# Patient Record
Sex: Female | Born: 1980 | Race: Black or African American | Hispanic: No | Marital: Single | Smoking: Former smoker
Health system: Southern US, Community
[De-identification: ages and names within clinical notes are randomized; demographics above are authoritative.]

## PROBLEM LIST (undated history)

## (undated) ENCOUNTER — Ambulatory Visit (HOSPITAL_COMMUNITY): Admission: EM | Payer: BLUE CROSS/BLUE SHIELD | Source: Home / Self Care

## (undated) DIAGNOSIS — R399 Unspecified symptoms and signs involving the genitourinary system: Secondary | ICD-10-CM

## (undated) DIAGNOSIS — R319 Hematuria, unspecified: Secondary | ICD-10-CM

## (undated) DIAGNOSIS — N809 Endometriosis, unspecified: Secondary | ICD-10-CM

## (undated) DIAGNOSIS — N3289 Other specified disorders of bladder: Secondary | ICD-10-CM

## (undated) DIAGNOSIS — R51 Headache: Secondary | ICD-10-CM

## (undated) DIAGNOSIS — N200 Calculus of kidney: Secondary | ICD-10-CM

## (undated) DIAGNOSIS — Z8719 Personal history of other diseases of the digestive system: Secondary | ICD-10-CM

## (undated) DIAGNOSIS — R300 Dysuria: Secondary | ICD-10-CM

## (undated) DIAGNOSIS — N80A0 Endometriosis of bladder, unspecified depth: Secondary | ICD-10-CM

## (undated) DIAGNOSIS — K56609 Unspecified intestinal obstruction, unspecified as to partial versus complete obstruction: Secondary | ICD-10-CM

## (undated) DIAGNOSIS — N808 Other endometriosis: Secondary | ICD-10-CM

## (undated) HISTORY — DX: Headache: R51

## (undated) HISTORY — PX: LAPAROSCOPY: SHX197

## (undated) HISTORY — PX: DILATION AND CURETTAGE OF UTERUS: SHX78

## (undated) HISTORY — PX: PARTIAL HYSTERECTOMY: SHX80

---

## 2006-04-06 ENCOUNTER — Emergency Department (HOSPITAL_COMMUNITY): Admission: EM | Admit: 2006-04-06 | Discharge: 2006-04-06 | Payer: Self-pay | Admitting: Emergency Medicine

## 2006-06-16 ENCOUNTER — Emergency Department (HOSPITAL_COMMUNITY): Admission: EM | Admit: 2006-06-16 | Discharge: 2006-06-16 | Payer: Self-pay | Admitting: Emergency Medicine

## 2007-07-29 ENCOUNTER — Inpatient Hospital Stay (HOSPITAL_COMMUNITY): Admission: RE | Admit: 2007-07-29 | Discharge: 2007-08-01 | Payer: Self-pay | Admitting: Obstetrics

## 2007-08-21 ENCOUNTER — Emergency Department (HOSPITAL_COMMUNITY): Admission: EM | Admit: 2007-08-21 | Discharge: 2007-08-21 | Payer: Self-pay | Admitting: Emergency Medicine

## 2008-10-03 ENCOUNTER — Emergency Department (HOSPITAL_COMMUNITY): Admission: EM | Admit: 2008-10-03 | Discharge: 2008-10-04 | Payer: Self-pay | Admitting: Emergency Medicine

## 2010-02-10 ENCOUNTER — Emergency Department (HOSPITAL_COMMUNITY)
Admission: EM | Admit: 2010-02-10 | Discharge: 2010-02-10 | Payer: Self-pay | Source: Home / Self Care | Admitting: Family Medicine

## 2010-05-30 NOTE — Discharge Summary (Signed)
NAMECHANAE, GEMMA NO.:  [id number]   MEDICAL RECORD NO.:  [id number]          PATIENT TYPE:  INP   LOCATION:  9146                          FACILITY:  [facility]   PHYSICIAN:  Kathreen Cosier, M.D.DATE OF BIRTH:  1980/06/14   DATE OF ADMISSION:  07/29/2007  DATE OF DISCHARGE:  08/01/2007                               DISCHARGE SUMMARY   The patient is a 30 year old gravida 2, para 1-0-0-1, and EDC on August 03, 2007.  She had a previous C-section and now at term desired repeat C-  section.  Negative GBS-HIV.  She had a female, Apgar 9 and 9, weighing 7  pounds 4 ounces, and posterior placenta to labor and delivery.  Postoperative hemoglobin was 8.7.  ARP are negative.  HIV negative.  She  did well and was discharged on the third postoperative day; ambulatory  on a regular diet, on Tylox for pain, and ferrous sulfate for her  anemia.  Discharged to see me in 6 weeks.   DISCHARGE DIAGNOSIS:  Status post repeat low-transverse cesarean section  at term.           ______________________________  Kathreen Cosier, M.D.     BAM/MEDQ  D:  08/20/2007  T:  08/20/2007  Job:  [id number]

## 2010-05-30 NOTE — Op Note (Signed)
Christine Brooks, URY NO.:  [id number]   MEDICAL RECORD NO.:  [id number]          PATIENT TYPE:  INP   LOCATION:  9146                          FACILITY:  [facility]   PHYSICIAN:  Kathreen Cosier, M.D.DATE OF BIRTH:  1980-01-23   DATE OF PROCEDURE:  07/29/2007  DATE OF DISCHARGE:                               OPERATIVE REPORT   PREOPERATIVE DIAGNOSIS:  Previous cesarean section at term, desires  repeat.   POSTOPERATIVE DIAGNOSIS:  Previous cesarean section at term, desires  repeat.   SURGEON:  Kathreen Cosier, MD   ASSISTANT:  Bing Neighbors. Clearance Coots, MD   PROCEDURE:  The patient was placed on the operating table in supine  position.  After the spinal administered, abdomen prepped and draped.  Bladder emptied with a Foley catheter.  Transverse suprapubic incision  made through the old scar carried down to the rectus fascia.  Fascia  cleaned and incised to the length of the incision.  Recti muscles were  retracted laterally.  The peritoneum was incised longitudinally.  Transverse incision made in the visceral peritoneum above the bladder.  Bladder mobilized inferiorly.  Transverse low uterine incision made.  Fluid clear.  The patient delivered of a female.  Agpar 9 and 9, weighing  7 pound 4 ounces from the LOA position.  The team was in attendance.  The fluid was clear.  The placenta was posterior and was removed  manually and sent to labor and delivery.  Uterine cavity cleaned with  dry laps.  Uterine incision closed in 1 layer with continuous suture of  #1 chromic.  Hemostasis was satisfactory.  Bladder flap reattached with  2-0 chromic.  Uterus well contracted.  Tubes and ovaries normal.  Abdomen closed in layers.  Peritoneum continuous suture of 0-chromic  fascia, continuous suture of 0-Dexon and the skin closed with  subcuticular stitch of 4-0 Monocryl.  Blood loss 800 mL.  The patient  tolerated the procedure well and taken to recovery room in good  condition.           ______________________________  Kathreen Cosier, M.D.     BAM/MEDQ  D:  07/29/2007  T:  07/30/2007  Job:  [id number]

## 2010-10-12 LAB — CBC
MCHC: 33.8
MCV: 84.5
Platelets: 147 — ABNORMAL LOW

## 2010-10-12 LAB — RPR: RPR Ser Ql: NONREACTIVE

## 2010-10-13 LAB — CBC
HCT: 28.6 — ABNORMAL LOW
Platelets: 128 — ABNORMAL LOW
RDW: 14.7

## 2010-10-13 LAB — POCT RAPID STREP A: Streptococcus, Group A Screen (Direct): NEGATIVE

## 2011-02-19 ENCOUNTER — Other Ambulatory Visit: Payer: Self-pay | Admitting: Obstetrics and Gynecology

## 2011-02-19 ENCOUNTER — Telehealth (HOSPITAL_COMMUNITY): Payer: Self-pay | Admitting: *Deleted

## 2011-02-19 ENCOUNTER — Emergency Department (INDEPENDENT_AMBULATORY_CARE_PROVIDER_SITE_OTHER)
Admission: EM | Admit: 2011-02-19 | Discharge: 2011-02-19 | Disposition: A | Discharge status: Home / Self Care | Payer: Self-pay | Source: Home / Self Care | Attending: Emergency Medicine | Admitting: Emergency Medicine

## 2011-02-19 ENCOUNTER — Encounter (HOSPITAL_COMMUNITY): Payer: Self-pay | Admitting: Emergency Medicine

## 2011-02-19 DIAGNOSIS — N6039 Fibrosclerosis of unspecified breast: Secondary | ICD-10-CM

## 2011-02-19 DIAGNOSIS — N6320 Unspecified lump in the left breast, unspecified quadrant: Secondary | ICD-10-CM

## 2011-02-19 MED ORDER — NAPROXEN 500 MG PO TABS: 500.0000 mg | ORAL_TABLET | Freq: Two times a day (BID) | ORAL | Status: AC

## 2011-02-19 NOTE — ED Notes (Signed)
PT HERE WITH LEFT BREAST ACHY PAIN THAT STARTED INTERMIT LAST MNTH BUT HAS PROGRESSED WITH PT HAVING TO HOLD BREAST DUE TO PAIN.UNABLE TO PALPATE KNOT.NO C/O NIPPLE DRAINAGE.PT LMP LAST YEAR POST DEPO INJECTION.HAS NO INSURANCE

## 2011-02-19 NOTE — ED Notes (Addendum)
2/4 1138 Order for digital mammography received from Dr. Ladon Applebaum and faxed to [phone]. Confirmation received. 1500 I called the [facility] for appt. and she said they need the order for an [place] also. It is done whenever there is a palpable mass. I told her Dr. Ladon Applebaum has already left but I would get the order and refax. She gave me appt. time of 2/11 @ 1330. Order obtained from Dr. Artis Flock for [place]. Order refaxed  and confirmation received. Pt. notified. Christine Brooks 02/19/2011

## 2011-02-19 NOTE — ED Provider Notes (Addendum)
History     CSN: [id number]  Arrival date & time 02/19/11  0930   First MD Initiated Contact with Patient 02/19/11 1057      Chief Complaint  Patient presents with  . Breast Pain    (Consider location/radiation/quality/duration/timing/severity/associated sxs/prior treatment) HPI Comments: Breast pain, and palpable not" It hurst all over my Left breast (points to outer quadrant) No skin changes, No fevers, No recent trauma injuries or falls.  The history is provided by the patient.    History reviewed. No pertinent past medical history.  History reviewed. No pertinent past surgical history.  History reviewed. No pertinent family history.  History  Substance Use Topics  . Smoking status: Current Everyday Smoker  . Smokeless tobacco: Not on file  . Alcohol Use: No    OB History    Grav Para Term Preterm Abortions TAB SAB Ect Mult Living                  Review of Systems  Constitutional: Negative for fever, appetite change, fatigue and unexpected weight change.  HENT: Negative for neck pain.   Respiratory: Negative for shortness of breath.   Cardiovascular: Negative for chest pain.  Skin: Negative for color change and rash.    Allergies  Review of patient's allergies indicates no known allergies.  Home Medications   Current Outpatient Rx  Name Route Sig Dispense Refill  . NAPROXEN 500 MG PO TABS Oral Take 1 tablet (500 mg total) by mouth 2 (two) times daily. 15 tablet 0    BP 119/76  Pulse 75  Temp(Src) 98 F (36.7 C) (Oral)  Resp 16  SpO2 100%  Physical Exam  Constitutional: She appears well-developed and well-nourished. No distress.  HENT:  Head: Normocephalic.  Eyes: Conjunctivae are normal. No scleral icterus.  Neck: Neck supple. No JVD present.  Pulmonary/Chest: Left breast exhibits mass and tenderness. Left breast exhibits no inverted nipple, no nipple discharge and no skin change. Breasts are symmetrical.    Lymphadenopathy:    She has no  cervical adenopathy.    ED Course  Procedures (including critical care time)  Labs Reviewed - No data to display No results found.   1. Breast fibrosclerosis       MDM  Left sided breast pain with abnormal tissue consistency, patient relates to have had breast cyst before, but this feels bigger and tender. Outer (superior and lower quadrants with fibrotic type tissue? Generated referral to [facility] and further evaluation, form was given to S.Y, PATIENT EXPLAINED IMPORTANCE to be evaluated by Breast center to exclude a malignant process, acknowledged and understood, agreed with plan of care.        Jimmie Molly, MD 02/19/11 1514  Jimmie Molly, MD 02/19/11 1517  Jimmie Molly, MD 02/19/11 [phone]

## 2011-02-26 ENCOUNTER — Ambulatory Visit
Admission: RE | Admit: 2011-02-26 | Discharge: 2011-02-26 | Disposition: A | Discharge status: Home / Self Care | Payer: Self-pay | Source: Ambulatory Visit | Attending: Obstetrics and Gynecology | Admitting: Obstetrics and Gynecology

## 2011-02-26 DIAGNOSIS — N6320 Unspecified lump in the left breast, unspecified quadrant: Secondary | ICD-10-CM

## 2013-02-23 ENCOUNTER — Encounter (HOSPITAL_COMMUNITY): Payer: Self-pay | Admitting: Emergency Medicine

## 2013-02-23 ENCOUNTER — Emergency Department (INDEPENDENT_AMBULATORY_CARE_PROVIDER_SITE_OTHER)
Admission: EM | Admit: 2013-02-23 | Discharge: 2013-02-23 | Disposition: A | Discharge status: Home / Self Care | Payer: BC Managed Care – PPO | Source: Home / Self Care | Attending: Family Medicine | Admitting: Family Medicine

## 2013-02-23 DIAGNOSIS — R519 Headache, unspecified: Secondary | ICD-10-CM

## 2013-02-23 DIAGNOSIS — R51 Headache: Secondary | ICD-10-CM

## 2013-02-23 MED ORDER — DICLOFENAC SODIUM 50 MG PO TBEC: 50.0000 mg | DELAYED_RELEASE_TABLET | Freq: Two times a day (BID) | ORAL | Status: DC | PRN

## 2013-02-23 NOTE — ED Provider Notes (Signed)
Weber Cookszia Castelo is a 33 y.o. female who presents to Urgent Care today for posterior left-sided headache. This is been present off and on for several months but worsening over the past 6 days or so. The pain is moderate. She denies any weakness or numbness loss of coordination or dysfunction. She's not tried any medications. No fevers or chills nausea vomiting or diarrhea.   History reviewed. No pertinent past medical history. History  Substance Use Topics  . Smoking status: Current Every Day Smoker  . Smokeless tobacco: Not on file  . Alcohol Use: No   ROS as above Medications: No current facility-administered medications for this encounter.   Current Outpatient Prescriptions  Medication Sig Dispense Refill  . diclofenac (VOLTAREN) 50 MG EC tablet Take 1 tablet (50 mg total) by mouth 2 (two) times daily as needed.  60 tablet  0    Exam:  BP 114/78  Pulse 70  Temp(Src) 98.7 F (37.1 C) (Oral)  Resp 14  SpO2 100%  LMP 02/18/2013 Gen: Well NAD HEENT: EOMI,  MMM, no scalp abnormality. Lungs: Normal work of breathing. CTABL Heart: RRR no MRG Abd: NABS, Soft. NT, ND Exts: Brisk capillary refill, warm and well perfused.  Neuro: AO x3. Sensation, strength and motion are intact.  Normal gait  No results found for this or any previous visit (from the past 24 hour(s)). No results found.  Assessment and Plan: 33 y.o. female with headache. Likely muscular pain however patient is very concerned about it. Plan to treat with diclofenac and refer to neurology for further workup and evaluation.  Discussed warning signs or symptoms. Please see discharge instructions. Patient expresses understanding.    Rodolph BongEvan S Zan Triska, MD 02/23/13 1350

## 2013-02-23 NOTE — ED Notes (Signed)
C/o having pains off/on all over head but mainly at the back of the head.  Pt has not tried any otc meds for symptoms.  Denies any other symptoms.  On set 2 days ago.

## 2013-02-23 NOTE — Discharge Instructions (Signed)
Thank you for coming in today. Take diclofenac twice daily as needed for pain. Followup with [facility] if still symptomatic in a week or 2. Try to followup with her primary care Dr. Nyra CapesGo to the emergency room if your headache becomes excruciating or you have weakness or numbness or uncontrolled vomiting.

## 2013-02-25 ENCOUNTER — Encounter: Payer: Self-pay | Admitting: Neurology

## 2013-02-25 ENCOUNTER — Ambulatory Visit (INDEPENDENT_AMBULATORY_CARE_PROVIDER_SITE_OTHER): Payer: BC Managed Care – PPO | Admitting: Neurology

## 2013-02-25 VITALS — BP 104/60 | HR 68 | Temp 98.0°F | Resp 18 | Ht 61.0 in | Wt 116.6 lb

## 2013-02-25 DIAGNOSIS — R51 Headache: Secondary | ICD-10-CM

## 2013-02-25 DIAGNOSIS — R519 Headache, unspecified: Secondary | ICD-10-CM | POA: Insufficient documentation

## 2013-02-25 MED ORDER — GABAPENTIN 100 MG PO CAPS: ORAL_CAPSULE | ORAL | Status: DC

## 2013-02-25 NOTE — Progress Notes (Signed)
NEUROLOGY CONSULTATION NOTE Terasa Orsini MRN: [id number] DOB: Oct 16, 1980  Referring provider: Dr. Clementeen Graham Primary care provider: None  Reason for consult:  Left posterior head pain  Dear Dr Denyse Amass:  Thank you for your kind referral of Murray Guzzetta for consultation of the above symptoms. Although her history is well known to you, please allow me to reiterate it for the purpose of our medical record.   HISTORY OF PRESENT ILLNESS: This is a 33 year old right-handed woman with no significant past medical history, who was in her usual state of health until a week ago when she started having intermittent pressure-like pain over the left parietal region.  Pain lasts 30 minutes at a time, occurring multiple times daily, at times radiating to the left frontal region.  When pain flares, there is occasional associated tingling throughout her whole body. She had to leave work yesterday due to pain.  She denies scalp tenderness, nausea, vomiting, photo/phonophobia, no focal numbness/weakness.  She denies any neck pain, back pain, recent head injuries or infections.  No ear pain/hearing loss/tinnitus.  She had an episode of lightheadedness last Sunday.  No loss of consciousness. She denies any sleep difficulties.  Lying down seems to help.  She recalls similar symptoms a few months ago that lasted a week or so and did not seek medical attention at that time.  There is no prior history of headaches/migraines, no family history of similar symptoms.  She went to the ER 2 days ago and was given Diclofenac 50mg  BID, which she takes only at bedtime due to daytime drowsiness.  This helps with the pain.  She denies any diplopia, dysarthria, dysphagia, back pain, bowel/bladder dysfunction.    PAST MEDICAL HISTORY: History reviewed. No pertinent past medical history.  PAST SURGICAL HISTORY: History reviewed. No pertinent past surgical history.  MEDICATIONS: Current Outpatient Prescriptions on File Prior to  Visit  Medication Sig Dispense Refill  . diclofenac (VOLTAREN) 50 MG EC tablet Take 1 tablet (50 mg total) by mouth 2 (two) times daily as needed.  60 tablet  0   No current facility-administered medications on file prior to visit.    ALLERGIES: No Known Allergies  FAMILY HISTORY: Family History  Problem Relation Age of Onset  . Ataxia Neg Hx   . Chorea Neg Hx   . Dementia Neg Hx   . Mental retardation Neg Hx   . Migraines Neg Hx   . Multiple sclerosis Neg Hx   . Neurofibromatosis Neg Hx   . Neuropathy Neg Hx   . Parkinsonism Neg Hx   . Seizures Neg Hx   . Stroke Neg Hx     SOCIAL HISTORY: History   Social History  . Marital Status: Single    Spouse Name: N/A    Number of Children: N/A  . Years of Education: N/A   Occupational History  . Not on file.   Social History Main Topics  . Smoking status: Current Every Day Smoker  . Smokeless tobacco: Not on file  . Alcohol Use: No  . Drug Use: No  . Sexual Activity: Yes    Birth Control/ Protection: Condom   Other Topics Concern  . Not on file   Social History Narrative  . No narrative on file    REVIEW OF SYSTEMS: Constitutional: No fevers, chills, or sweats, no generalized fatigue, change in appetite Eyes: No visual changes, double vision, eye pain Ear, nose and throat: No hearing loss, ear pain, nasal congestion, sore throat  Cardiovascular: No chest pain, palpitations Respiratory:  No shortness of breath at rest or with exertion, wheezes GastrointestinaI: No nausea, vomiting, diarrhea, abdominal pain, fecal incontinence Genitourinary:  No dysuria, urinary retention or frequency Musculoskeletal:  No neck pain, back pain Integumentary: No rash, pruritus, skin lesions Neurological: as above Psychiatric: No depression, insomnia, anxiety Endocrine: No palpitations, fatigue, diaphoresis, mood swings, change in appetite, change in weight, increased thirst Hematologic/Lymphatic:  No anemia, purpura,  petechiae. Allergic/Immunologic: no itchy/runny eyes, nasal congestion, recent allergic reactions, rashes  PHYSICAL EXAM: Filed Vitals:   02/25/13 0852  BP: 104/60  Pulse: 68  Temp: 98 F (36.7 C)  Resp: 18   General: No acute distress Head:  Normocephalic/atraumatic Neck: supple, no paraspinal tenderness, full range of motion Back: No paraspinal tenderness Heart: regular rate and rhythm Lungs: Clear to auscultation bilaterally. Vascular: No carotid bruits. Skin/Extremities: No rash, no edema Neurological Exam: Mental status: alert and oriented to person, place, and time, no dysarthria or dysphagia, intact fluency and comprehension. Cranial nerves: CN I: not tested CN II: pupils equal, round and reactive to light, visual fields intact, fundi unremarkable. CN III, IV, VI:  full range of motion, no nystagmus, no ptosis CN V: facial sensation intact CN VII: upper and lower face symmetric CN VIII: hearing intact CN IX, X: gag intact, uvula midline CN XI: sternocleidomastoid and trapezius muscles intact CN XII: tongue midline Bulk & Tone: normal, no fasciculations. Motor: 5/5 throughout with no pronator drift. Sensation: intact to light touch, cold, pin, vibration and joint position sense.  No extinction to double simultaneous stimulation.  Romberg test negative Deep Tendon Reflexes: +2 throughout, no clonus Plantar responses: downgoing bilaterally Finger to nose testing: no incoordination Gait: narrow-based and steady, able to tandem walk adequately.  IMPRESSION: This is a 33 year old right-handed woman presenting with new onset head pain localized over the left parietal region, radiating to the left frontal region.  She reports occasional associated tingling throughout her body with the pain.  No other focal symptoms.  Neurological exam is normal.  Considerations include tension-type headaches, occipital neuralgia.  An MRI brain without contrast will be ordered to assess for  underlying structural abnormality.  We discussed symptomatic treatment with gabapentin, side effects were discussed, she will start at low dose to 100mg  qhs x 1 week, then increase to 200mg  qhs.  She will continue with prn anti-inflammatory medication, either diclofenac or naproxen.  Rebound headaches were discussed, and she knows to minimize use of rescue medications to 3/week.    She will follow-up in 3-4 weeks.    Thank you for allowing me to participate in the care of this patient. Please do not hesitate to call for any questions or concerns.   Patrcia DollyKaren Johnryan Sao, M.D.

## 2013-02-25 NOTE — Patient Instructions (Signed)
[facility]  03/04/13 at 4:45 pm

## 2013-02-27 ENCOUNTER — Encounter (HOSPITAL_COMMUNITY): Payer: Self-pay | Admitting: Emergency Medicine

## 2013-02-27 ENCOUNTER — Emergency Department (INDEPENDENT_AMBULATORY_CARE_PROVIDER_SITE_OTHER)
Admission: EM | Admit: 2013-02-27 | Discharge: 2013-02-27 | Disposition: A | Discharge status: Home / Self Care | Payer: BC Managed Care – PPO | Source: Home / Self Care

## 2013-02-27 DIAGNOSIS — R51 Headache: Secondary | ICD-10-CM

## 2013-02-27 DIAGNOSIS — R519 Headache, unspecified: Secondary | ICD-10-CM

## 2013-02-27 NOTE — Discharge Instructions (Signed)

## 2013-02-27 NOTE — ED Provider Notes (Signed)
CSN: [id number]     Arrival date & time 02/27/13  1150 History   First MD Initiated Contact with Patient 02/27/13 1304     Chief Complaint  Patient presents with  . Headache     (Consider location/radiation/quality/duration/timing/severity/associated sxs/prior Treatment) HPI Comments: 33 year old female who is complaining of painful spots in her head had to leave work today due to the pain. She has been seen by an urgent care physician last week and a neurologist 2 days ago. She said she came to the urgent care to get a note stating that she left work due to a headache and needed tonight. There are no changes in her head pain or any symptoms or problems. She continues to take her medications which helped however she is unable to take them during the day due to sedation. She has been on the phone most of this morning and that has exacerbated her pain.   History reviewed. No pertinent past medical history. History reviewed. No pertinent past surgical history. Family History  Problem Relation Age of Onset  . Ataxia Neg Hx   . Chorea Neg Hx   . Dementia Neg Hx   . Mental retardation Neg Hx   . Migraines Neg Hx   . Multiple sclerosis Neg Hx   . Neurofibromatosis Neg Hx   . Neuropathy Neg Hx   . Parkinsonism Neg Hx   . Seizures Neg Hx   . Stroke Neg Hx    History  Substance Use Topics  . Smoking status: Current Every Day Smoker  . Smokeless tobacco: Not on file  . Alcohol Use: No   OB History   Grav Para Term Preterm Abortions TAB SAB Ect Mult Living                 Review of Systems  Constitutional: Negative.   Respiratory: Negative.   Cardiovascular: Negative.   Gastrointestinal: Negative.   Genitourinary: Negative.   Neurological: Positive for headaches. Negative for dizziness, tremors, syncope, speech difficulty and weakness.  Psychiatric/Behavioral: Negative.       Allergies  Review of patient's allergies indicates no known allergies.  Home Medications    Current Outpatient Rx  Name  Route  Sig  Dispense  Refill  . diclofenac (VOLTAREN) 50 MG EC tablet   Oral   Take 1 tablet (50 mg total) by mouth 2 (two) times daily as needed.   60 tablet   0   . gabapentin (NEURONTIN) 100 MG capsule      1 capsule at bedtime for 1 week, then 2 caps at bedtime   60 capsule   3    BP 118/62  Pulse 82  Temp(Src) 98 F (36.7 C) (Oral)  Resp 18  SpO2 100%  LMP 02/18/2013 Physical Exam  Nursing note and vitals reviewed. Constitutional: She is oriented to person, place, and time. She appears well-developed and well-nourished. No distress.  Eyes: EOM are normal. Pupils are equal, round, and reactive to light.  Neck: Normal range of motion. Neck supple.  Cardiovascular: Normal rate.   Pulmonary/Chest: Effort normal. No respiratory distress.  Neurological: She is alert and oriented to person, place, and time. She exhibits normal muscle tone.  Skin: Skin is warm and dry.  Psychiatric: She has a normal mood and affect.    ED Course  Procedures (including critical care time) Labs Review Labs Reviewed - No data to display Imaging Review No results found.    MDM   Final diagnoses:  Head pain  Work note given the patient is an urgent care today Instructions on headache No changes in medication Patient stable and in no distress.  Hayden Rasmussenavid Kassim Guertin, NP 02/27/13 [id number]

## 2013-02-27 NOTE — ED Notes (Signed)
Pt c/o persistent HA on the back of head onset 2 weeks Pain is intermittent that'll last for 30 minutes Seen here on 02/23/13 and seen by [facility] on 2/11 Has an MRI sched for next Wednesday She is alert w/no signs of acute distress.

## 2013-02-28 NOTE — ED Provider Notes (Signed)
Medical screening examination/treatment/procedure(s) were performed by non-physician practitioner and as supervising physician I was immediately available for consultation/collaboration.  Klaus Casteneda, M.D.    Vinod Mikesell C Euna Armon, MD 02/28/13 0909 

## 2013-03-04 ENCOUNTER — Ambulatory Visit (HOSPITAL_COMMUNITY)
Admission: RE | Admit: 2013-03-04 | Discharge: 2013-03-04 | Disposition: A | Discharge status: Home / Self Care | Payer: BC Managed Care – PPO | Source: Ambulatory Visit | Attending: Neurology | Admitting: Neurology

## 2013-03-04 DIAGNOSIS — R51 Headache: Secondary | ICD-10-CM | POA: Insufficient documentation

## 2013-04-06 ENCOUNTER — Encounter: Payer: Self-pay | Admitting: Neurology

## 2013-04-06 ENCOUNTER — Ambulatory Visit (INDEPENDENT_AMBULATORY_CARE_PROVIDER_SITE_OTHER): Payer: BC Managed Care – PPO | Admitting: Neurology

## 2013-04-06 VITALS — BP 100/74 | HR 68 | Temp 98.1°F | Ht 60.0 in | Wt 115.3 lb

## 2013-04-06 DIAGNOSIS — R51 Headache: Secondary | ICD-10-CM

## 2013-04-06 MED ORDER — GABAPENTIN 100 MG PO CAPS: ORAL_CAPSULE | ORAL | Status: DC

## 2013-04-06 NOTE — Patient Instructions (Signed)
1. Continue gabapentin 100mg  at bedtime  2. Follow-up in 3-4 months 3. Call our office for any problems

## 2013-04-06 NOTE — Progress Notes (Signed)
NEUROLOGY FOLLOW UP OFFICE NOTE  Christine Cookszia Lecy [id number]  HISTORY OF PRESENT ILLNESS: I had the pleasure of seeing Christine Brooks in follow-up in the neurology clinic on 04/06/2013.  The patient was last seen 6 weeks ago and presents for follow-up of new onset headaches that started in February 2015.  I personally reviewed brain MRI without contrast which was unremarkable, with 4 non-specific punctate T2 FLAIR hyperintensities over the bilateral white matter regions.  She reports headaches are better with gabapentin 100mg  qhs.  Last headache was 3 weeks ago.  She felt sleepy on gabapentin 200mg  qhs and has only been taking 1 capsule at night.    HPI:  This is a 33 yo RH woman with no significant past medical history, who presented for new onset intermittent pressure-like pain over the left parietal region in February 2015. Pain lasts 30 minutes at a time, occurring multiple times daily, at times radiating to the left frontal region. When pain flares, there is occasional associated tingling throughout her whole body. She had to leave work due to pain. No scalp tenderness, nausea, vomiting, photo/phonophobia, no focal numbness/weakness.   PAST MEDICAL HISTORY: Past Medical History  Diagnosis Date  . [id number](478.2Headache(784.0)     MEDICATIONS: Current Outpatient Prescriptions on File Prior to Visit  Medication Sig Dispense Refill  . diclofenac (VOLTAREN) 50 MG EC tablet Take 1 tablet (50 mg total) by mouth 2 (two) times daily as needed.  60 tablet  0  Gabapentin 100mg  qhs No current facility-administered medications on file prior to visit.    ALLERGIES: No Known Allergies  FAMILY HISTORY: Family History  Problem Relation Age of Onset  . Ataxia Neg Hx   . Chorea Neg Hx   . Dementia Neg Hx   . Mental retardation Neg Hx   . Migraines Neg Hx   . Multiple sclerosis Neg Hx   . Neurofibromatosis Neg Hx   . Neuropathy Neg Hx   . Parkinsonism Neg Hx   . Seizures Neg Hx   . Stroke Neg Hx     SOCIAL  HISTORY: History   Social History  . Marital Status: Single    Spouse Name: N/A    Number of Children: N/A  . Years of Education: N/A   Occupational History  . Not on file.   Social History Main Topics  . Smoking status: Current Every Day Smoker  . Smokeless tobacco: Not on file  . Alcohol Use: No  . Drug Use: No  . Sexual Activity: Yes    Birth Control/ Protection: Condom   Other Topics Concern  . Not on file   Social History Narrative  . No narrative on file    REVIEW OF SYSTEMS: Constitutional: No fevers, chills, or sweats, no generalized fatigue, change in appetite Eyes: No visual changes, double vision, eye pain Ear, nose and throat: No hearing loss, ear pain, nasal congestion, sore throat Cardiovascular: No chest pain, palpitations Respiratory:  No shortness of breath at rest or with exertion, wheezes GastrointestinaI: No nausea, vomiting, diarrhea, abdominal pain, fecal incontinence Genitourinary:  No dysuria, urinary retention or frequency Musculoskeletal:  No neck pain, back pain Integumentary: No rash, pruritus, skin lesions Neurological: as above Psychiatric: No depression, insomnia, anxiety Endocrine: No palpitations, fatigue, diaphoresis, mood swings, change in appetite, change in weight, increased thirst Hematologic/Lymphatic:  No anemia, purpura, petechiae. Allergic/Immunologic: no itchy/runny eyes, nasal congestion, recent allergic reactions, rashes  PHYSICAL EXAM: Filed Vitals:   04/06/13 1114  BP: 100/74  Pulse: 68  Temp: 98.1 F (36.7 C)   General: No acute distress Head:  Normocephalic/atraumatic Neck: supple, no paraspinal tenderness, full range of motion Heart:  Regular rate and rhythm Lungs:  Clear to auscultation bilaterally Back: No paraspinal tenderness Skin/Extremities: No rash, no edema Neurological Exam: alert and oriented to person, place, and time. No aphasia or dysarthria. Fund of knowledge is appropriate.  Recent and remote  memory are intact.  Attention and concentration are normal.    Able to name objects and repeat phrases. Cranial nerves: Pupils equal, round, reactive to light.  Fundoscopic exam unremarkable, no papilledema. Extraocular movements intact with no nystagmus. Visual fields full. Facial sensation intact. No facial asymmetry. Tongue, uvula, palate midline.  Motor: Bulk and tone normal, muscle strength 5/5 throughout with no pronator drift.  Sensation to light touch, temperature and vibration intact.  No extinction to double simultaneous stimulation.  Deep tendon reflexes 2+ throughout, toes downgoing.  Finger to nose testing intact.  Gait narrow-based and steady, able to tandem walk adequately.  Romberg negative.  IMPRESSION: This is a 33 yo RH woman who presented 6 weeks ago with presenting new onset head pain localized over the left parietal region, radiating to the left frontal region. Neurological exam is normal, MRI brain unremarkable. Considerations include tension-type headaches, occipital neuralgia.  She has had good response to gabapentin 100mg  and will continue on low dose for now.  She will continue with prn anti-inflammatory medication, either diclofenac or naproxen.  She will keep a headache diary and follow-up in 3-4 months.  She knows to call our office for any problems.  The duration of this appointment visit was 20 minutes of face-to-face time with the patient.  Greater than 50% of this time was spent in counseling, explanation of diagnosis, planning of further management, and coordination of care.   Patrcia Dolly, M.D.

## 2013-09-18 ENCOUNTER — Emergency Department (HOSPITAL_COMMUNITY)
Admission: EM | Admit: 2013-09-18 | Discharge: 2013-09-18 | Disposition: A | Discharge status: Home / Self Care | Payer: BC Managed Care – PPO | Attending: Emergency Medicine | Admitting: Emergency Medicine

## 2013-09-18 ENCOUNTER — Encounter (HOSPITAL_COMMUNITY): Payer: Self-pay | Admitting: Emergency Medicine

## 2013-09-18 DIAGNOSIS — R110 Nausea: Secondary | ICD-10-CM | POA: Diagnosis not present

## 2013-09-18 DIAGNOSIS — R109 Unspecified abdominal pain: Secondary | ICD-10-CM | POA: Insufficient documentation

## 2013-09-18 DIAGNOSIS — Z3202 Encounter for pregnancy test, result negative: Secondary | ICD-10-CM | POA: Diagnosis not present

## 2013-09-18 DIAGNOSIS — K3189 Other diseases of stomach and duodenum: Secondary | ICD-10-CM | POA: Diagnosis not present

## 2013-09-18 DIAGNOSIS — R1013 Epigastric pain: Secondary | ICD-10-CM | POA: Insufficient documentation

## 2013-09-18 DIAGNOSIS — F17200 Nicotine dependence, unspecified, uncomplicated: Secondary | ICD-10-CM | POA: Insufficient documentation

## 2013-09-18 LAB — COMPREHENSIVE METABOLIC PANEL
ALT: 10 U/L (ref 0–35)
ANION GAP: 10 (ref 5–15)
AST: 14 U/L (ref 0–37)
Albumin: 3.7 g/dL (ref 3.5–5.2)
Alkaline Phosphatase: 66 U/L (ref 39–117)
BUN: 15 mg/dL (ref 6–23)
CALCIUM: 9.6 mg/dL (ref 8.4–10.5)
CHLORIDE: 104 meq/L (ref 96–112)
CO2: 26 meq/L (ref 19–32)
CREATININE: 0.83 mg/dL (ref 0.50–1.10)
GFR calc Af Amer: >90 mL/min (ref >90)
Glucose, Bld: 100 mg/dL — ABNORMAL HIGH (ref 70–99)
Potassium: 3.9 mEq/L (ref 3.7–5.3)
Sodium: 140 mEq/L (ref 137–147)
Total Protein: 7.6 g/dL (ref 6.0–8.3)

## 2013-09-18 LAB — CBC WITH DIFFERENTIAL/PLATELET
Basophils Absolute: 0 K/uL (ref 0.0–0.1)
Basophils Relative: 0 % (ref 0–1)
Eosinophils Absolute: 0.1 K/uL (ref 0.0–0.7)
Eosinophils Relative: 2 % (ref 0–5)
HCT: 34.8 % — ABNORMAL LOW (ref 36.0–46.0)
Hemoglobin: 12.2 g/dL (ref 12.0–15.0)
Lymphocytes Relative: 51 % — ABNORMAL HIGH (ref 12–46)
Lymphs Abs: 3 K/uL (ref 0.7–4.0)
MCH: 28.1 pg (ref 26.0–34.0)
MCHC: 35.1 g/dL (ref 30.0–36.0)
MCV: 80.2 fL (ref 78.0–100.0)
Monocytes Absolute: 0.4 K/uL (ref 0.1–1.0)
Monocytes Relative: 7 % (ref 3–12)
Neutro Abs: 2.4 K/uL (ref 1.7–7.7)
Neutrophils Relative %: 40 % — ABNORMAL LOW (ref 43–77)
Platelets: 171 K/uL (ref 150–400)
RBC: 4.34 MIL/uL (ref 3.87–5.11)
RDW: 12.3 % (ref 11.5–15.5)
WBC: 6 K/uL (ref 4.0–10.5)

## 2013-09-18 LAB — URINALYSIS, ROUTINE W REFLEX MICROSCOPIC
BILIRUBIN URINE: NEGATIVE
Glucose, UA: NEGATIVE mg/dL
KETONES UR: NEGATIVE mg/dL
Leukocytes, UA: NEGATIVE
NITRITE: NEGATIVE
PROTEIN: NEGATIVE mg/dL
Specific Gravity, Urine: 1.025 (ref 1.005–1.030)
UROBILINOGEN UA: 1 mg/dL (ref 0.0–1.0)
pH: 6.5 (ref 5.0–8.0)

## 2013-09-18 LAB — URINE MICROSCOPIC-ADD ON

## 2013-09-18 LAB — PREGNANCY, URINE: Preg Test, Ur: NEGATIVE

## 2013-09-18 LAB — LIPASE, BLOOD: Lipase: 22 U/L (ref 11–59)

## 2013-09-18 MED ORDER — SUCRALFATE 1 G PO TABS: 1.0000 g | ORAL_TABLET | Freq: Four times a day (QID) | ORAL | Status: DC

## 2013-09-18 MED ORDER — GI COCKTAIL ~~LOC~~
30.0000 mL | Freq: Once | ORAL | Status: AC
  Administered 2013-09-18: 30 mL via ORAL
  Filled 2013-09-18: qty 30

## 2013-09-18 MED ORDER — PANTOPRAZOLE SODIUM 20 MG PO TBEC: 40.0000 mg | DELAYED_RELEASE_TABLET | Freq: Every day | ORAL | Status: DC

## 2013-09-18 MED ORDER — PANTOPRAZOLE SODIUM 40 MG PO TBEC
40.0000 mg | DELAYED_RELEASE_TABLET | Freq: Every day | ORAL | Status: DC
  Administered 2013-09-18: 40 mg via ORAL
  Filled 2013-09-18: qty 1

## 2013-09-18 NOTE — ED Notes (Signed)
Pt reports chest/upper abdominal pain and acid reflux x 2 weeks. Pt states that the pain is worse after she eats.

## 2013-09-18 NOTE — ED Provider Notes (Signed)
CSN: [id number]     Arrival date & time 09/18/13  0057 History   First MD Initiated Contact with Patient 09/18/13 0309     Chief Complaint  Patient presents with  . Abdominal Pain     (Consider location/radiation/quality/duration/timing/severity/associated sxs/prior Treatment) HPI 33 year old female presents to emergency department from home with complaint of epigastric pain after eating associated with nausea for last 2-3 weeks.  Patient reports that she has burning sensation that goes up her chest.  Patient reports after eating, she usually has to use the bathroom.  No symptoms at this time.  She reports that she is stopped eating as much as possible to avoid the symptoms.  She has not taken any over-the-counter remedies.  No prior history of same.  No right upper quadrant pain or radiation into the shoulder.  Pain is with any food or liquid no difference with fatty foods Past Medical History  Diagnosis Date  . Headache(784.0)    History reviewed. No pertinent past surgical history. Family History  Problem Relation Age of Onset  . Ataxia Neg Hx   . Chorea Neg Hx   . Dementia Neg Hx   . Mental retardation Neg Hx   . Migraines Neg Hx   . Multiple sclerosis Neg Hx   . Neurofibromatosis Neg Hx   . Neuropathy Neg Hx   . Parkinsonism Neg Hx   . Seizures Neg Hx   . Stroke Neg Hx    History  Substance Use Topics  . Smoking status: Current Every Day Smoker  . Smokeless tobacco: Not on file  . Alcohol Use: No   OB History   Grav Para Term Preterm Abortions TAB SAB Ect Mult Living                 Review of Systems   See History of Present Illness; otherwise all other systems are reviewed and negative  Allergies  Review of patient's allergies indicates no known allergies.  Home Medications   Prior to Admission medications   Medication Sig Start Date End Date Taking? Authorizing Provider  diclofenac (VOLTAREN) 50 MG EC tablet Take 50 mg by mouth 2 (two) times daily as needed  for mild pain.   Yes Historical Provider, MD  gabapentin (NEURONTIN) 100 MG capsule Take 100 mg by mouth at bedtime as needed (headaches).   Yes Historical Provider, MD   BP 118/80  Pulse 72  Temp(Src) 98.1 F (36.7 C)  Resp 11  SpO2 100% Physical Exam  Nursing note and vitals reviewed. Constitutional: She is oriented to person, place, and time. She appears well-developed and well-nourished. No distress.  HENT:  Head: Normocephalic and atraumatic.  Nose: Nose normal.  Mouth/Throat: Oropharynx is clear and moist.  Eyes: Conjunctivae and EOM are normal. Pupils are equal, round, and reactive to light.  Neck: Normal range of motion. Neck supple. No JVD present. No tracheal deviation present. No thyromegaly present.  Cardiovascular: Normal rate, regular rhythm, normal heart sounds and intact distal pulses.  Exam reveals no gallop and no friction rub.   No murmur heard. Pulmonary/Chest: Effort normal and breath sounds normal. No stridor. No respiratory distress. She has no wheezes. She has no rales. She exhibits no tenderness.  Abdominal: Soft. Bowel sounds are normal. She exhibits no distension and no mass. There is no tenderness. There is no rebound and no guarding.  Musculoskeletal: Normal range of motion. She exhibits no edema and no tenderness.  Lymphadenopathy:    She has no cervical adenopathy.  Neurological: She is alert and oriented to person, place, and time. She exhibits normal muscle tone. Coordination normal.  Skin: Skin is warm and dry. No rash noted. No erythema. No pallor.  Psychiatric: She has a normal mood and affect. Her behavior is normal. Judgment and thought content normal.    ED Course  Procedures (including critical care time) Labs Review Labs Reviewed  CBC WITH DIFFERENTIAL - Abnormal; Notable for the following:    HCT 34.8 (*)    Neutrophils Relative % 40 (*)    Lymphocytes Relative 51 (*)    All other components within normal limits  COMPREHENSIVE METABOLIC  PANEL - Abnormal; Notable for the following:    Glucose, Bld 100 (*)    Total Bilirubin <0.2 (*)    All other components within normal limits  URINALYSIS, ROUTINE W REFLEX MICROSCOPIC - Abnormal; Notable for the following:    Hgb urine dipstick MODERATE (*)    All other components within normal limits  URINE MICROSCOPIC-ADD ON - Abnormal; Notable for the following:    Squamous Epithelial / LPF FEW (*)    Bacteria, UA MANY (*)    All other components within normal limits  LIPASE, BLOOD  PREGNANCY, URINE    Imaging Review No results found.   EKG Interpretation None      MDM   Final diagnoses:  Epigastric pain  Dyspepsia    33 year old female with epigastric pain, most likely dyspepsia/gastritis.  Will start on Carafate and Protonix.  Patient received GI cocktail here with Protonix, tolerated crackers and ginger ale without difficulty.    Olivia Mackie, MD 09/18/13 [phone]

## 2013-09-18 NOTE — Discharge Instructions (Signed)
Indigestion °Indigestion is discomfort in the upper abdomen that is caused by underlying problems such as gastroesophageal reflux disease (GERD), ulcers, or gallbladder problems.  °CAUSES  °Indigestion can be caused by many things. Possible causes include: °· Stomach acid in the esophagus. °· Stomach infections, usually caused by the bacteria H. pylori. °· Being overweight. °· Hiatal hernia. This means part of the stomach pushes up through the diaphragm. °· Overeating. °· Emotional problems, such as stress, anxiety, or depression. °· Poor nutrition. °· Consuming too much alcohol, tobacco, or caffeine. °· Consuming spicy foods, fats, peppermint, chocolate, tomato products, citrus, or fruit juices. °· Medicines such as aspirin and other anti-inflammatory drugs, hormones, steroids, and thyroid medicines. °· Gastroparesis. This is a condition in which the stomach does not empty properly. °· Stomach cancer. °· Pregnancy, due to an increase in hormone levels, a relaxation of muscles in the digestive tract, and pressure on the stomach from the growing fetus. °SYMPTOMS  °· Uncomfortable feeling of fullness after eating. °· Pain or burning sensation in the upper abdomen. °· Bloating. °· Belching and gas. °· Nausea and vomiting. °· Acidic taste in the mouth. °· Burning sensation in the chest (heartburn). °DIAGNOSIS  °Your caregiver will review your medical history and perform a physical exam. Other tests, such as blood tests, stool tests, X-rays, and other imaging scans, may be done to check for more serious problems. °TREATMENT  °Liquid antacids and other drugs may be given to block stomach acid secretion. Medicines that increase esophageal muscle tone may also be given to help reduce symptoms. If an infection is found, antibiotic medicine may be given. °HOME CARE INSTRUCTIONS °· Avoid foods and drinks that make your symptoms worse, such as: °¨ Caffeine or alcoholic drinks. °¨ Chocolate. °¨ Peppermint or mint  flavorings. °¨ Garlic and onions. °¨ Spicy foods. °¨ Citrus fruits, such as oranges, lemons, or limes. °¨ Tomato-based foods such as sauce, chili, salsa, and pizza. °¨ Fried and fatty foods. °· Avoid eating for the 3 hours prior to your bedtime. °· Eat small, frequent meals instead of large meals. °· Stop smoking if you smoke. °· Maintain a healthy weight. °· Wear loose-fitting clothing. Do not wear anything tight around your waist that causes pressure on your stomach. °· Raise the head of your bed 4 to 8 inches with wood blocks to help you sleep. Extra pillows will not help. °· Only take over-the-counter or prescription medicines as directed by your caregiver. °· Do not take aspirin, ibuprofen, or other nonsteroidal anti-inflammatory drugs (NSAIDs). °SEEK IMMEDIATE MEDICAL CARE IF:  °· You are not better after 2 days. °· You have chest pressure or pain that radiates up into your neck, arms, back, jaw, or upper abdomen. °· You have difficulty swallowing. °· You keep vomiting. °· You have black or bloody stools. °· You have a fever. °· You have dizziness, fainting, difficulty breathing, or heavy sweating. °· You have severe abdominal pain. °· You lose weight without trying. °MAKE SURE YOU: °· Understand these instructions. °· Will watch your condition. °· Will get help right away if you are not doing well or get worse. °Document Released: 02/09/2004 Document Revised: 03/26/2011 Document Reviewed: 08/16/2010 °[employer]® Patient Information ©2015 [employer]. This information is not intended to replace advice given to you by your health care provider. Make sure you discuss any questions you have with your health care provider. ° °

## 2013-09-18 NOTE — ED Notes (Addendum)
Pt has had a few sips of ginger ale and 2 packets of saltine crackers, and denies pain at this time.

## 2013-09-18 NOTE — ED Notes (Signed)
Otter, MD at bedside.  

## 2013-09-18 NOTE — ED Notes (Signed)
Pt in c/o upper abdominal pain for the last month, states the symptoms are worse after eating, c/o nausea and acid reflux, also diarrhea and vomiting. Pt denies any pain or symptoms at this time, states she was advised recently to come and get these symptoms checked out.

## 2013-09-18 NOTE — ED Notes (Signed)
Pt given ginger ale and crackers to see if she can tolerate them.

## 2014-04-20 ENCOUNTER — Encounter: Payer: Self-pay | Admitting: Neurology

## 2014-04-20 ENCOUNTER — Telehealth: Payer: Self-pay | Admitting: Neurology

## 2014-04-20 ENCOUNTER — Ambulatory Visit (INDEPENDENT_AMBULATORY_CARE_PROVIDER_SITE_OTHER): Payer: BLUE CROSS/BLUE SHIELD | Admitting: Neurology

## 2014-04-20 VITALS — BP 102/68 | HR 66 | Temp 97.5°F | Resp 18 | Ht 60.0 in | Wt 111.7 lb

## 2014-04-20 DIAGNOSIS — Z0279 Encounter for issue of other medical certificate: Secondary | ICD-10-CM

## 2014-04-20 DIAGNOSIS — M5481 Occipital neuralgia: Secondary | ICD-10-CM

## 2014-04-20 MED ORDER — GABAPENTIN 100 MG PO CAPS: ORAL_CAPSULE | ORAL | Status: DC

## 2014-04-20 NOTE — Telephone Encounter (Signed)
Returned patient's call. She wanted to know if she could take the Gabapentin twice a day instead of just one a day. Wanted to know if she should do Aleve during the day. I spoke with Dr. Karel JarvisAquino about this, she wants patient to do the Gabapentin 1 capsule at bedtime for 1 week & if she hasn't been drowsy on med after the week she can up it to twice daily. She can take Aleve during the day in the mean time but not to exceed taking it more than 3 times in a week.

## 2014-04-20 NOTE — Patient Instructions (Signed)
1. Restart Gabapentin 100mg : take 1 capsule at bedtime 2. Minimize Aleve or any over the counter pain medication to 2-3 a week to avoid rebound headaches 3. Keep a headache calendar 4. Follow-up in 6 months. Give our office a call in 2 months as to how you are doing on the medication

## 2014-04-20 NOTE — Progress Notes (Signed)
NEUROLOGY FOLLOW UP OFFICE NOTE  Christine Brooks [id number]  HISTORY OF PRESENT ILLNESS: I had the pleasure of seeing Christine Brooks in follow-up in the neurology clinic on 04/20/2014.  The patient was last seen a year ago for new onset headaches suggestive of occipital neuralgia versus tension-type headaches with pain localized over the left parietal region. She had good response to low dose gabapentin 100mg  qhs and took this for 2 months, then stopped the medication. She has not had any significant problems until 2 weeks ago when the headaches recurred, similar to prior headaches with pressure over the left parietal region, 8/10 in intensity, "it just stays there" for a couple of days. She has been taking Aleve daily for the past 2 weeks. She has some associated lightheadedness, no nausea, vomiting, photo/phonophobia, visual obscurations, focal numbness/tingling/weakness. She denies any falls, no change in sleep or increased stress than usual. She gets 8 hours of sleep.   HPI: This is a 34 yo RH woman with no significant past medical history, who presented for new onset intermittent pressure-like pain over the left parietal region in February 2015. Pain lasts 30 minutes at a time, occurring multiple times daily, at times radiating to the left frontal region. When pain flares, there is occasional associated tingling throughout her whole body. She had to leave work due to pain. No family history of headaches.  Diagnostic Data: MRI without contrast was unremarkable, with 4 non-specific punctate T2 FLAIR hyperintensities over the bilateral white matter regions.   PAST MEDICAL HISTORY: Past Medical History  Diagnosis Date  . NWGNFAOZ(308.6Headache(784.0)     MEDICATIONS: Current Outpatient Prescriptions on File Prior to Visit  Medication Sig Dispense Refill  . gabapentin (NEURONTIN) 100 MG capsule Take 100 mg by mouth at bedtime as needed (headaches) (Patient not taking: Reported 04/20/2014)    . pantoprazole  (PROTONIX) 20 MG tablet Take 2 tablets (40 mg total) by mouth daily. (Patient not taking: Reported on 04/20/2014) 30 tablet 0  . sucralfate (CARAFATE) 1 G tablet Take 1 tablet (1 g total) by mouth 4 (four) times daily. (Patient not taking: Reported on 04/20/2014) 30 tablet 0   No current facility-administered medications on file prior to visit.    ALLERGIES: No Known Allergies  FAMILY HISTORY: Family History  Problem Relation Age of Onset  . Ataxia Neg Hx   . Chorea Neg Hx   . Dementia Neg Hx   . Mental retardation Neg Hx   . Migraines Neg Hx   . Multiple sclerosis Neg Hx   . Neurofibromatosis Neg Hx   . Neuropathy Neg Hx   . Parkinsonism Neg Hx   . Seizures Neg Hx   . Stroke Neg Hx     SOCIAL HISTORY: History   Social History  . Marital Status: Single    Spouse Name: N/A  . Number of Children: N/A  . Years of Education: N/A   Occupational History  . Not on file.   Social History Main Topics  . Smoking status: Former Games developermoker  . Smokeless tobacco: Never Used  . Alcohol Use: 0.0 oz/week    0 Standard drinks or equivalent per week     Comment: rare  . Drug Use: No  . Sexual Activity:    Partners: Male    Birth Control/ Protection: Condom   Other Topics Concern  . Not on file   Social History Narrative    REVIEW OF SYSTEMS: Constitutional: No fevers, chills, or sweats, no generalized fatigue, change in appetite  Eyes: No visual changes, double vision, eye pain Ear, nose and throat: No hearing loss, ear pain, nasal congestion, sore throat Cardiovascular: No chest pain, palpitations Respiratory:  No shortness of breath at rest or with exertion, wheezes GastrointestinaI: No nausea, vomiting, diarrhea, abdominal pain, fecal incontinence Genitourinary:  No dysuria, urinary retention or frequency Musculoskeletal:  No neck pain, back pain Integumentary: No rash, pruritus, skin lesions Neurological: as above Psychiatric: No depression, insomnia, anxiety Endocrine: No  palpitations, fatigue, diaphoresis, mood swings, change in appetite, change in weight, increased thirst Hematologic/Lymphatic:  No anemia, purpura, petechiae. Allergic/Immunologic: no itchy/runny eyes, nasal congestion, recent allergic reactions, rashes  PHYSICAL EXAM: Filed Vitals:   04/20/14 0846  BP: 102/68  Pulse: 66  Temp: 97.5 F (36.4 C)  Resp: 18   General: No acute distress Head:  Normocephalic/atraumatic, no temporal or scalp tenderness to palpation Neck: supple, no paraspinal tenderness, full range of motion Heart:  Regular rate and rhythm Lungs:  Clear to auscultation bilaterally Back: No paraspinal tenderness Skin/Extremities: No rash, no edema Neurological Exam: alert and oriented to person, place, and time. No aphasia or dysarthria. Fund of knowledge is appropriate.  Recent and remote memory are intact.  Attention and concentration are normal.    Able to name objects and repeat phrases. Cranial nerves: Pupils equal, round, reactive to light.  Fundoscopic exam unremarkable, no papilledema. Extraocular movements intact with no nystagmus. Visual fields full. Facial sensation intact. No facial asymmetry. Tongue, uvula, palate midline.  Motor: Bulk and tone normal, muscle strength 5/5 throughout with no pronator drift.  Sensation to light touch intact.  No extinction to double simultaneous stimulation.  Deep tendon reflexes 2+ throughout, toes downgoing.  Finger to nose testing intact.  Gait narrow-based and steady, able to tandem walk adequately.  Romberg negative.  IMPRESSION: This is a 34 yo RH woman who presented a year ago with new onset head pain localized over the left parietal region, radiating to the left frontal region. Neurological exam normal, MRI brain unremarkable. Considerations include tension-type headaches, occipital neuralgia. She had good response to gabapentin  and stopped the medication after 2 months. She now has recurrence of the same headaches and will  restart the medication. Side effects were discussed. She knows to minimize Aleve intake to 2-3 a week to avoid rebound headaches. She will keep a headache diary and call our office in 2 months to update on her symptoms. She will follow-up in 6 months or earlier if needed.   Thank you for allowing me to participate in her care.  Please do not hesitate to call for any questions or concerns.  The duration of this appointment visit was 25 minutes of face-to-face time with the patient.  Greater than 50% of this time was spent in counseling, explanation of diagnosis, planning of further management, and coordination of care.   Patrcia Dolly, M.D.

## 2014-04-20 NOTE — Telephone Encounter (Signed)
Pt has some questions please call about medication please call [phone]

## 2014-04-30 ENCOUNTER — Telehealth: Payer: Self-pay | Admitting: *Deleted

## 2014-04-30 NOTE — Telephone Encounter (Signed)
Patient still very light headed and dizzy patient would like to have a MRI. patient not sure if its coming from her medication Call back number [phone]

## 2014-04-30 NOTE — Telephone Encounter (Signed)
Would opt for a head CT instead, pls order CT head without contrast for dizziness. Thanks

## 2014-05-03 ENCOUNTER — Other Ambulatory Visit: Payer: Self-pay | Admitting: *Deleted

## 2014-05-03 DIAGNOSIS — M5481 Occipital neuralgia: Secondary | ICD-10-CM

## 2014-05-03 NOTE — Telephone Encounter (Signed)
Orders places patient made aware she states she will call scheduling to make and appointment

## 2014-05-03 NOTE — Telephone Encounter (Signed)
[facility] to reach patient to [facility]

## 2014-05-07 ENCOUNTER — Ambulatory Visit (HOSPITAL_COMMUNITY)
Admission: RE | Admit: 2014-05-07 | Discharge: 2014-05-07 | Disposition: A | Discharge status: Home / Self Care | Payer: BLUE CROSS/BLUE SHIELD | Source: Ambulatory Visit | Attending: Neurology | Admitting: Neurology

## 2014-05-07 ENCOUNTER — Other Ambulatory Visit: Payer: Self-pay | Admitting: Neurology

## 2014-05-07 ENCOUNTER — Encounter (HOSPITAL_COMMUNITY): Payer: Self-pay | Admitting: Emergency Medicine

## 2014-05-07 ENCOUNTER — Telehealth: Payer: Self-pay | Admitting: *Deleted

## 2014-05-07 ENCOUNTER — Encounter (HOSPITAL_COMMUNITY): Payer: Self-pay

## 2014-05-07 ENCOUNTER — Emergency Department (INDEPENDENT_AMBULATORY_CARE_PROVIDER_SITE_OTHER)
Admission: EM | Admit: 2014-05-07 | Discharge: 2014-05-07 | Disposition: A | Discharge status: Home / Self Care | Payer: BLUE CROSS/BLUE SHIELD | Source: Home / Self Care | Attending: Family Medicine | Admitting: Family Medicine

## 2014-05-07 DIAGNOSIS — R51 Headache: Secondary | ICD-10-CM | POA: Insufficient documentation

## 2014-05-07 DIAGNOSIS — M5481 Occipital neuralgia: Secondary | ICD-10-CM

## 2014-05-07 DIAGNOSIS — R42 Dizziness and giddiness: Secondary | ICD-10-CM | POA: Insufficient documentation

## 2014-05-07 DIAGNOSIS — L700 Acne vulgaris: Secondary | ICD-10-CM

## 2014-05-07 MED ORDER — CLINDAMYCIN-TRETINOIN 1.2-0.025 % EX GEL: CUTANEOUS | Status: DC

## 2014-05-07 NOTE — Telephone Encounter (Signed)
I spoke with her. She states that she has had 2 episodes of dizziness and she couldn't continue to be on the phone & her job is based off of production(customer service). She states during those times she needed to take a break and spend about 15 minutes resting in her car. She states that her job does give her 2 scheduled breaks in her shift, she wants this in case she has these episodes at un scheduled break time. Please advise. She does have FMLA in place for intermittent leave for her headaches.

## 2014-05-07 NOTE — Telephone Encounter (Signed)
Patient called to request a letter for her employer stating she needs to have breaks off the phone as needed (?) patient would like a copy faxed to her employer and also a copy for herself that she will come pick up. Call back number [phone] Fax number [phone]

## 2014-05-07 NOTE — Telephone Encounter (Signed)
Pls let her know we can add this to her FMLA forms, would do that instead of separate letter. Also, pls let her know I reviewed head CT and it is normal, no evidence of tumor, stroke, or bleed. Thanks

## 2014-05-07 NOTE — ED Notes (Signed)
C/o rash on face onset 1 week; burns and itches Reports she applied a product on her face that is meant for her scalp 2 weeks ago Has appt w/derm this Thursday Denies fever, chills Alert, no signs of acute distress.

## 2014-05-07 NOTE — Telephone Encounter (Signed)
Called patient & lmovm that her FMLA forms would be revised and re-faxed to [phone].

## 2014-05-07 NOTE — Discharge Instructions (Signed)
Acne  Acne is a skin problem that causes pimples. Acne occurs when the pores in your skin get blocked. Your pores may become red, sore, and swollen (inflamed), or infected with a common skin bacterium (Propionibacterium acnes). Acne is a common skin problem. Up to 80% of people get acne at some time. Acne is especially common from the ages of 12 to 24. Acne usually goes away over time with proper treatment.  CAUSES   Your pores each contain an oil gland. The oil glands make an oily substance called sebum. Acne happens when these glands get plugged with sebum, dead skin cells, and dirt. The P. acnes bacteria that are normally found in the oil glands then multiply, causing inflammation. Acne is commonly triggered by changes in your hormones. These hormonal changes can cause the oil glands to get bigger and to make more sebum. Factors that can make acne worse include:   Hormone changes during adolescence.   Hormone changes during women's menstrual cycles.   Hormone changes during pregnancy.   Oil-based cosmetics and hair products.   Harshly scrubbing the skin.   Strong soaps.   Stress.   Hormone problems due to certain diseases.   Long or oily hair rubbing against the skin.   Certain medicines.   Pressure from headbands, backpacks, or shoulder pads.   Exposure to certain oils and chemicals.  SYMPTOMS   Acne often occurs on the face, neck, chest, and upper back. Symptoms include:   Small, red bumps (pimples or papules).   Whiteheads (closed comedones).   Blackheads (open comedones).   Small, pus-filled pimples (pustules).   Big, red pimples or pustules that feel tender.  More severe acne can cause:   An infected area that contains a collection of pus (abscess).   Hard, painful, fluid-filled sacs (cysts).   Scars.  DIAGNOSIS   Your caregiver can usually tell what the problem is by doing a physical exam.  TREATMENT   There are many good treatments for acne. Some are available over the counter and some  are available with a prescription. The treatment that is best for you depends on the type of acne you have and how severe it is. It may take 2 months of treatment before your acne gets better. Common treatments include:   Creams and lotions that prevent oil glands from clogging.   Creams and lotions that treat or prevent infections and inflammation.   Antibiotics applied to the skin or taken as a pill.   Pills that decrease sebum production.   Birth control pills.   Light or laser treatments.   Minor surgery.   Injections of medicine into the affected areas.   Chemicals that cause peeling of the skin.  HOME CARE INSTRUCTIONS   Good skin care is the most important part of treatment.   Wash your skin gently at least twice a day and after exercise. Always wash your skin before bed.   Use mild soap.   After each wash, apply a water-based skin moisturizer.   Keep your hair clean and off of your face. Shampoo your hair daily.   Only take medicines as directed by your caregiver.   Use a sunscreen or sunblock with SPF 30 or greater. This is especially important when you are using acne medicines.   Choose cosmetics that are noncomedogenic. This means they do not plug the oil glands.   Avoid leaning your chin or forehead on your hands.   Avoid wearing tight headbands or hats.     Avoid picking or squeezing your pimples. This can make your acne worse and cause scarring.  SEEK MEDICAL CARE IF:    Your acne is not better after 8 weeks.   Your acne gets worse.   You have a large area of skin that is red or tender.  Document Released: 12/30/1999 Document Revised: 05/18/2013 Document Reviewed: 10/20/2010  Exit[employer] Patient Information 2015 [employer]. This information is not intended to replace advice given to you by your health care provider. Make sure you discuss any questions you have with your health care provider.

## 2014-05-07 NOTE — ED Provider Notes (Signed)
CSN: [id number]     Arrival date & time 05/07/14  [id number] History   None    Chief Complaint  Patient presents with  . Rash   (Consider location/radiation/quality/duration/timing/severity/associated sxs/prior Treatment) HPI      34 year old female presents complaining of a rash. This started approximately one week ago. She says it burns and itches and is discolored. She is very concerned by this, she says the rash looks horrible. No systemic symptoms or history of rashes. 2 weeks ago she applied some product of her face that was supposed to be put on her scalp, she doesn't remember what it was called.  Past Medical History  Diagnosis Date  . Headache(784.0)    History reviewed. No pertinent past surgical history. Family History  Problem Relation Age of Onset  . Ataxia Neg Hx   . Chorea Neg Hx   . Dementia Neg Hx   . Mental retardation Neg Hx   . Migraines Neg Hx   . Multiple sclerosis Neg Hx   . Neurofibromatosis Neg Hx   . Neuropathy Neg Hx   . Parkinsonism Neg Hx   . Seizures Neg Hx   . Stroke Neg Hx    History  Substance Use Topics  . Smoking status: Former Games developermoker  . Smokeless tobacco: Never Used  . Alcohol Use: 0.0 oz/week    0 Standard drinks or equivalent per week     Comment: rare   OB History    No data available     Review of Systems  Skin: Positive for rash.  All other systems reviewed and are negative.   Allergies  Review of patient's allergies indicates no known allergies.  Home Medications   Prior to Admission medications   Medication Sig Start Date End Date Taking? Authorizing Provider  gabapentin (NEURONTIN) 100 MG capsule Take 1 capsule at bedtime 04/20/14  Yes Van ClinesKaren M Aquino, MD  clindamycin-tretinoin Salli Quarry(ZIANA) gel Apply a pea-sized amount to your face at bedtime 05/07/14   Graylon GoodZachary H Jullisa Grigoryan, PA-C  pantoprazole (PROTONIX) 20 MG tablet Take 2 tablets (40 mg total) by mouth daily. Patient not taking: Reported on 04/20/2014 09/18/13   Marisa Severinlga Otter, MD  sucralfate  (CARAFATE) 1 G tablet Take 1 tablet (1 g total) by mouth 4 (four) times daily. Patient not taking: Reported on 04/20/2014 09/18/13   Marisa Severinlga Otter, MD   BP 120/70 mmHg  Pulse 94  Temp(Src) 98.5 F (36.9 C) (Oral)  Resp 14  SpO2 100%  LMP 04/14/2014 (Exact Date) Physical Exam  Constitutional: She is oriented to person, place, and time. Vital signs are normal. She appears well-developed and well-nourished. No distress.  HENT:  Head: Normocephalic and atraumatic.  Pulmonary/Chest: Effort normal. No respiratory distress.  Neurological: She is alert and oriented to person, place, and time. She has normal strength. Coordination normal.  Skin: Skin is warm and dry. Rash noted. Rash is papular (she has a few very small mildly erythematous papules on the face that is consistent with mild acne, however I cannot appreciate any rash that she is describing). She is not diaphoretic.  Psychiatric: She has a normal mood and affect. Judgment normal.  Nursing note and vitals reviewed.   ED Course  Procedures (including critical care time) Labs Review Labs Reviewed - No data to display  Imaging Review Ct Head Wo Contrast  05/07/2014   CLINICAL DATA:  Dizziness for 1 month. Headaches. Initial encounter.  EXAM: CT HEAD WITHOUT CONTRAST  TECHNIQUE: Contiguous axial images were obtained from the  base of the skull through the vertex without intravenous contrast.  COMPARISON:  MRI brain 02/22/2013.  FINDINGS: No evidence for acute infarction, hemorrhage, mass lesion, hydrocephalus, or extra-axial fluid. Normal cerebral volume. No white matter disease. Calvarium intact. No sinus or mastoid disease. Negative orbits. Similar appearance to priors when technique differences are considered.  IMPRESSION: Negative exam.   Electronically Signed   By: Davonna Belling M.D.   On: 05/07/2014 10:16     MDM   1. Acne vulgaris    We will treat her acne, otherwise she may follow-up with dermatology.  Meds ordered this encounter   Medications  . clindamycin-tretinoin (ZIANA) gel    Sig: Apply a pea-sized amount to your face at bedtime    Dispense:  30 g    Refill:  0    Order Specific Question:  Supervising Provider    Answer:  Bradd Canary D [[id number]]       Graylon Good, PA-C 05/07/14 1104

## 2014-05-11 ENCOUNTER — Telehealth: Payer: Self-pay | Admitting: Neurology

## 2014-05-11 NOTE — Telephone Encounter (Signed)
Pt wants to the status of the FMLA paper work you can leave her a message on [phone]

## 2014-05-11 NOTE — Telephone Encounter (Signed)
Left msg on patient's vm that there was an addendum made to her forms and they were refaxed to her HR dept this morning. Asked her to call me back if any further questions.

## 2014-10-22 ENCOUNTER — Telehealth: Payer: Self-pay | Admitting: Neurology

## 2014-10-22 NOTE — Telephone Encounter (Signed)
PT asked if Dr Karel Jarvis could fill out some FMLA papers for her and I did advise her of the 25.00 fee and she said she will be dropping them off/Dawn CB# [phone]

## 2014-10-22 NOTE — Telephone Encounter (Signed)
Noted  

## 2014-11-03 ENCOUNTER — Telehealth: Payer: Self-pay | Admitting: Neurology

## 2014-11-03 NOTE — Telephone Encounter (Signed)
PT called and wanted to know if her FMLA papers were ready, she said they are due tomorrow/Dawn CB# [phone]

## 2014-11-04 NOTE — Telephone Encounter (Signed)
Paperwork completed today. Will be faxed when patient comes in and pays $25.00 form fee.

## 2014-11-05 ENCOUNTER — Telehealth: Payer: Self-pay | Admitting: Neurology

## 2014-11-05 NOTE — Telephone Encounter (Signed)
Pt called to check if her documents were faxed/ call back @ [phone]

## 2014-11-05 NOTE — Telephone Encounter (Signed)
Returned call and left msg on vm that FMLA forms were faxed this morning.

## 2015-01-11 ENCOUNTER — Encounter: Payer: Self-pay | Admitting: Gastroenterology

## 2015-02-25 ENCOUNTER — Ambulatory Visit: Payer: BLUE CROSS/BLUE SHIELD | Admitting: Gastroenterology

## 2015-05-16 ENCOUNTER — Telehealth: Payer: Self-pay | Admitting: Neurology

## 2015-05-16 NOTE — Telephone Encounter (Signed)
Pt needs a refill on gabenpentin pt called back number is [phone]

## 2015-05-16 NOTE — Telephone Encounter (Signed)
Lmovm to rtn my call. 

## 2015-05-23 ENCOUNTER — Telehealth: Payer: Self-pay | Admitting: Neurology

## 2015-05-23 DIAGNOSIS — M5481 Occipital neuralgia: Secondary | ICD-10-CM

## 2015-05-23 MED ORDER — GABAPENTIN 100 MG PO CAPS: ORAL_CAPSULE | ORAL | Status: DC

## 2015-05-23 NOTE — Telephone Encounter (Signed)
Returned call. Patient needs refill on her gabapentin, will til she can see Dr. Karel JarvisAquino on 8/1.

## 2015-05-23 NOTE — Telephone Encounter (Signed)
PT scheduled an appointment with Dr Karel JarvisAquino and she asked if you could give her a call/Dawn CB# [phone]

## 2015-06-08 DIAGNOSIS — B379 Candidiasis, unspecified: Secondary | ICD-10-CM | POA: Diagnosis not present

## 2015-06-08 DIAGNOSIS — Z113 Encounter for screening for infections with a predominantly sexual mode of transmission: Secondary | ICD-10-CM | POA: Diagnosis not present

## 2015-06-22 ENCOUNTER — Encounter (HOSPITAL_BASED_OUTPATIENT_CLINIC_OR_DEPARTMENT_OTHER): Payer: Self-pay | Admitting: *Deleted

## 2015-06-22 ENCOUNTER — Emergency Department (HOSPITAL_BASED_OUTPATIENT_CLINIC_OR_DEPARTMENT_OTHER)
Admission: EM | Admit: 2015-06-22 | Discharge: 2015-06-22 | Disposition: A | Discharge status: Home / Self Care | Payer: BLUE CROSS/BLUE SHIELD | Attending: Emergency Medicine | Admitting: Emergency Medicine

## 2015-06-22 ENCOUNTER — Emergency Department (HOSPITAL_BASED_OUTPATIENT_CLINIC_OR_DEPARTMENT_OTHER): Payer: BLUE CROSS/BLUE SHIELD

## 2015-06-22 DIAGNOSIS — R079 Chest pain, unspecified: Secondary | ICD-10-CM | POA: Insufficient documentation

## 2015-06-22 DIAGNOSIS — F17200 Nicotine dependence, unspecified, uncomplicated: Secondary | ICD-10-CM | POA: Insufficient documentation

## 2015-06-22 DIAGNOSIS — M549 Dorsalgia, unspecified: Secondary | ICD-10-CM | POA: Diagnosis not present

## 2015-06-22 DIAGNOSIS — M546 Pain in thoracic spine: Secondary | ICD-10-CM | POA: Diagnosis not present

## 2015-06-22 LAB — CBC WITH DIFFERENTIAL/PLATELET
BASOS PCT: 0 %
Basophils Absolute: 0 10*3/uL (ref 0.0–0.1)
EOS ABS: 0.1 10*3/uL (ref 0.0–0.7)
Eosinophils Relative: 1 %
HCT: 32.4 % — ABNORMAL LOW (ref 36.0–46.0)
HEMOGLOBIN: 11.6 g/dL — AB (ref 12.0–15.0)
Lymphocytes Relative: 27 %
Lymphs Abs: 2.2 10*3/uL (ref 0.7–4.0)
MCH: 27.8 pg (ref 26.0–34.0)
MCHC: 35.8 g/dL (ref 30.0–36.0)
MCV: 77.7 fL — ABNORMAL LOW (ref 78.0–100.0)
Monocytes Absolute: 0.9 10*3/uL (ref 0.1–1.0)
Monocytes Relative: 11 %
NEUTROS PCT: 61 %
Neutro Abs: 5 10*3/uL (ref 1.7–7.7)
Platelets: 144 10*3/uL — ABNORMAL LOW (ref 150–400)
RBC: 4.17 MIL/uL (ref 3.87–5.11)
RDW: 12 % (ref 11.5–15.5)
WBC: 8.2 10*3/uL (ref 4.0–10.5)

## 2015-06-22 LAB — URINALYSIS, ROUTINE W REFLEX MICROSCOPIC
BILIRUBIN URINE: NEGATIVE
Glucose, UA: NEGATIVE mg/dL
Hgb urine dipstick: NEGATIVE
KETONES UR: NEGATIVE mg/dL
Leukocytes, UA: NEGATIVE
NITRITE: NEGATIVE
PROTEIN: NEGATIVE mg/dL
Specific Gravity, Urine: 1.03 (ref 1.005–1.030)
pH: 6 (ref 5.0–8.0)

## 2015-06-22 LAB — COMPREHENSIVE METABOLIC PANEL
ALBUMIN: 4 g/dL (ref 3.5–5.0)
ALT: 13 U/L — AB (ref 14–54)
ANION GAP: 6 (ref 5–15)
AST: 15 U/L (ref 15–41)
Alkaline Phosphatase: 54 U/L (ref 38–126)
BUN: 12 mg/dL (ref 6–20)
CALCIUM: 9 mg/dL (ref 8.9–10.3)
CHLORIDE: 106 mmol/L (ref 101–111)
CO2: 23 mmol/L (ref 22–32)
Creatinine, Ser: 0.83 mg/dL (ref 0.44–1.00)
GFR calc Af Amer: >60 mL/min (ref >60)
GFR calc non Af Amer: >60 mL/min (ref >60)
Glucose, Bld: 94 mg/dL (ref 65–99)
Potassium: 3.6 mmol/L (ref 3.5–5.1)
SODIUM: 135 mmol/L (ref 135–145)
Total Bilirubin: 0.6 mg/dL (ref 0.3–1.2)
Total Protein: 7.6 g/dL (ref 6.5–8.1)

## 2015-06-22 LAB — PREGNANCY, URINE: Preg Test, Ur: NEGATIVE

## 2015-06-22 LAB — LIPASE, BLOOD: Lipase: 11 U/L (ref 11–51)

## 2015-06-22 MED ORDER — SUCRALFATE 1 G PO TABS
1.0000 g | ORAL_TABLET | Freq: Once | ORAL | Status: AC
  Administered 2015-06-22: 1 g via ORAL
  Filled 2015-06-22: qty 1

## 2015-06-22 MED ORDER — FAMOTIDINE IN NACL 20-0.9 MG/50ML-% IV SOLN
20.0000 mg | Freq: Once | INTRAVENOUS | Status: AC
  Administered 2015-06-22: 20 mg via INTRAVENOUS
  Filled 2015-06-22: qty 50

## 2015-06-22 NOTE — ED Notes (Signed)
Pt reports left upper back pain worse with inspiration x 10 hours also reports acid reflux most of the day. Denies N/V SHOB diaphoresis

## 2015-06-22 NOTE — ED Provider Notes (Signed)
CSN: [id number]     Arrival date & time 06/22/15  0051 History   First MD Initiated Contact with Patient 06/22/15 0117     Chief Complaint  Patient presents with  . Back Pain    upper back pain   . Chest Pain     (Consider location/radiation/quality/duration/timing/severity/associated sxs/prior Treatment) HPI Christine Brooks is a 35 y.o. female with no significant past medical history presenting today with left upper back pain. Patient states it began today. It does not radiate. It is made worse with deep inspiration. Nothing has made it better. She denies having this in the past. She denies any history of blood clots, long distance chart will, surgeries, or estrogen use. She has no pain or swelling in her legs. She denies any fevers or coughs. She states she has had experience acid reflux throughout the day today. She is concern for back pain whatsoever makes her she does not have a heart attack. There are no further complaints.  10 Systems reviewed and are negative for acute change except as noted in the HPI.     Past Medical History  Diagnosis Date  . NWGNFAOZ(308.6Headache(784.0)    Past Surgical History  Procedure Laterality Date  . Cesarean section     Family History  Problem Relation Age of Onset  . Ataxia Neg Hx   . Chorea Neg Hx   . Dementia Neg Hx   . Mental retardation Neg Hx   . Migraines Neg Hx   . Multiple sclerosis Neg Hx   . Neurofibromatosis Neg Hx   . Neuropathy Neg Hx   . Parkinsonism Neg Hx   . Seizures Neg Hx   . Stroke Neg Hx    Social History  Substance Use Topics  . Smoking status: Current Every Day Smoker  . Smokeless tobacco: Never Used  . Alcohol Use: 0.0 oz/week    0 Standard drinks or equivalent per week     Comment: rare   OB History    No data available     Review of Systems    Allergies  Review of patient's allergies indicates no known allergies.  Home Medications   Prior to Admission medications   Medication Sig Start Date End Date  Taking? Authorizing Provider  gabapentin (NEURONTIN) 100 MG capsule Take 1 capsule at bedtime 05/23/15   Van ClinesKaren M Aquino, MD   BP 125/76 mmHg  Pulse 69  Temp(Src) 98.7 F (37.1 C) (Oral)  Resp 18  Ht 5' (1.524 m)  Wt 113 lb (51.256 kg)  BMI 22.07 kg/m2  SpO2 100%  LMP 05/27/2015 Physical Exam  Constitutional: She is oriented to person, place, and time. She appears well-developed and well-nourished. No distress.  HENT:  Head: Normocephalic and atraumatic.  Nose: Nose normal.  Mouth/Throat: Oropharynx is clear and moist. No oropharyngeal exudate.  Eyes: Conjunctivae and EOM are normal. Pupils are equal, round, and reactive to light. No scleral icterus.  Neck: Normal range of motion. Neck supple. No JVD present. No tracheal deviation present. No thyromegaly present.  Cardiovascular: Normal rate, regular rhythm and normal heart sounds.  Exam reveals no gallop and no friction rub.   No murmur heard. Pulmonary/Chest: Effort normal and breath sounds normal. No respiratory distress. She has no wheezes. She exhibits no tenderness.  Abdominal: Soft. Bowel sounds are normal. She exhibits no distension and no mass. There is no tenderness. There is no rebound and no guarding.  Musculoskeletal: Normal range of motion. She exhibits no edema or tenderness.  Lymphadenopathy:    She has no cervical adenopathy.  Neurological: She is alert and oriented to person, place, and time. No cranial nerve deficit. She exhibits normal muscle tone.  Skin: Skin is warm and dry. No rash noted. No erythema. No pallor.  Nursing note and vitals reviewed.   ED Course  Procedures (including critical care time) Labs Review Labs Reviewed  URINALYSIS, ROUTINE W REFLEX MICROSCOPIC (NOT AT [facility]) - Abnormal; Notable for the following:    APPearance CLOUDY (*)    All other components within normal limits  CBC WITH DIFFERENTIAL/PLATELET - Abnormal; Notable for the following:    Hemoglobin 11.6 (*)    HCT 32.4 (*)    MCV  77.7 (*)    Platelets 144 (*)    All other components within normal limits  COMPREHENSIVE METABOLIC PANEL - Abnormal; Notable for the following:    ALT 13 (*)    All other components within normal limits  PREGNANCY, URINE  LIPASE, BLOOD    Imaging Review Dg Chest 2 View  06/22/2015  CLINICAL DATA:  Left upper back pain, worse with deep breath. EXAM: CHEST  2 VIEW COMPARISON:  04/06/2006 FINDINGS: Normal heart size and mediastinal contours. No acute infiltrate or edema. No effusion or pneumothorax. No osseous findings. IMPRESSION: Negative chest. Electronically Signed   By: Marnee Spring M.D.   On: 06/22/2015 02:07   I have personally reviewed and evaluated these images and lab results as part of my medical decision-making.   EKG Interpretation   Date/Time:  Wednesday June 22 2015 01:34:53 EDT Ventricular Rate:  72 PR Interval:  147 QRS Duration: 81 QT Interval:  393 QTC Calculation: 430 R Axis:   67 Text Interpretation:  Sinus rhythm Probable left atrial enlargement  Borderline T abnormalities, anterior leads tachycardia now resolved  Confirmed by Erroll Luna [phone]) on 06/22/2015 1:38:42 AM      MDM   Final diagnoses:  None    Patient presents emergency department for evaluation of left upper back pain. Will obtain a chest x-ray to evaluate for lung pathology. Laboratory studies are negative. Urinalysis and patency test negative as well. Patient given famotidine and Carafate for treatment of her reflux.    Emergency department workup has not found any acute cause for this pain. Primary care follow-up advised in 3 days. Patient appears well in no acute distress, vital signs were within her normal limits and she is safe for discharge.     Tomasita Crumble, MD 06/22/15 [phone]

## 2015-06-22 NOTE — Discharge Instructions (Signed)
Back Pain, Adult Christine Brooks, see a primary care doctor within 3 days for close follow up. Take tylenol or ibuprofen as needed for your pain. If any symptoms worsen, come back to the ED immediately. Thank you. Back pain is very common. The pain often gets better over time. The cause of back pain is usually not dangerous. Most people can learn to manage their back pain on their own.  HOME CARE  Watch your back pain for any changes. The following actions may help to lessen any pain you are feeling:  Stay active. Start with short walks on flat ground if you can. Try to walk farther each day.  Exercise regularly as told by your doctor. Exercise helps your back heal faster. It also helps avoid future injury by keeping your muscles strong and flexible.  Do not sit, drive, or stand in one place for more than 30 minutes.  Do not stay in bed. Resting more than 1-2 days can slow down your recovery.  Be careful when you bend or lift an object. Use good form when lifting:  Bend at your knees.  Keep the object close to your body.  Do not twist.  Sleep on a firm mattress. Lie on your side, and bend your knees. If you lie on your back, put a pillow under your knees.  Take medicines only as told by your doctor.  Put ice on the injured area.  Put ice in a plastic bag.  Place a towel between your skin and the bag.  Leave the ice on for 20 minutes, 2-3 times a day for the first 2-3 days. After that, you can switch between ice and heat packs.  Avoid feeling anxious or stressed. Find good ways to deal with stress, such as exercise.  Maintain a healthy weight. Extra weight puts stress on your back. GET HELP IF:   You have pain that does not go away with rest or medicine.  You have worsening pain that goes down into your legs or buttocks.  You have pain that does not get better in one week.  You have pain at night.  You lose weight.  You have a fever or chills. GET HELP RIGHT AWAY IF:    You cannot control when you poop (bowel movement) or pee (urinate).  Your arms or legs feel weak.  Your arms or legs lose feeling (numbness).  You feel sick to your stomach (nauseous) or throw up (vomit).  You have belly (abdominal) pain.  You feel like you may pass out (faint).   This information is not intended to replace advice given to you by your health care provider. Make sure you discuss any questions you have with your health care provider.   Document Released: 06/20/2007 Document Revised: 01/22/2014 Document Reviewed: 05/05/2013 Elsevier Interactive Patient Education [employer]

## 2015-07-06 ENCOUNTER — Encounter: Payer: Self-pay | Admitting: Family Medicine

## 2015-07-06 ENCOUNTER — Ambulatory Visit (INDEPENDENT_AMBULATORY_CARE_PROVIDER_SITE_OTHER): Payer: BLUE CROSS/BLUE SHIELD | Admitting: Family Medicine

## 2015-07-06 VITALS — BP 120/70 | HR 64 | Wt 111.8 lb

## 2015-07-06 DIAGNOSIS — Z7189 Other specified counseling: Secondary | ICD-10-CM | POA: Diagnosis not present

## 2015-07-06 DIAGNOSIS — Z7689 Persons encountering health services in other specified circumstances: Secondary | ICD-10-CM

## 2015-07-06 DIAGNOSIS — R350 Frequency of micturition: Secondary | ICD-10-CM | POA: Diagnosis not present

## 2015-07-06 DIAGNOSIS — R300 Dysuria: Secondary | ICD-10-CM

## 2015-07-06 LAB — POCT URINALYSIS DIPSTICK
BILIRUBIN UA: NEGATIVE
GLUCOSE UA: NEGATIVE
Ketones, UA: NEGATIVE
Leukocytes, UA: NEGATIVE
Nitrite, UA: NEGATIVE
Protein, UA: NEGATIVE
RBC UA: NEGATIVE
Urobilinogen, UA: NEGATIVE
pH, UA: 6

## 2015-07-06 NOTE — Patient Instructions (Addendum)
Your urine specimen was negative for infection however since you are having persistent symptoms I am sending it for culture. We will call you with this result. Your urine does show that you are not hydrated. I recommend drinking more water throughout the day and start being physically active.   Exercising to Stay Healthy Exercising regularly is important. It has many health benefits, such as:  Improving your overall fitness, flexibility, and endurance.  Increasing your bone density.  Helping with weight control.  Decreasing your body fat.  Increasing your muscle strength.  Reducing stress and tension.  Improving your overall health. In order to become healthy and stay healthy, it is recommended that you do moderate-intensity and vigorous-intensity exercise. You can tell that you are exercising at a moderate intensity if you have a higher heart rate and faster breathing, but you are still able to hold a conversation. You can tell that you are exercising at a vigorous intensity if you are breathing much harder and faster and cannot hold a conversation while exercising. HOW OFTEN SHOULD I EXERCISE? Choose an activity that you enjoy and set realistic goals. Your health care provider can help you to make an activity plan that works for you. Exercise regularly as directed by your health care provider. This may include:   Doing resistance training twice each week, such as:  Push-ups.  Sit-ups.  Lifting weights.  Using resistance bands.  Doing a given intensity of exercise for a given amount of time. Choose from these options:  150 minutes of moderate-intensity exercise every week.  75 minutes of vigorous-intensity exercise every week.  A mix of moderate-intensity and vigorous-intensity exercise every week. Children, pregnant women, people who are out of shape, people who are overweight, and older adults may need to consult a health care provider for individual recommendations. If  you have any sort of medical condition, be sure to consult your health care provider before starting a new exercise program.  WHAT ARE SOME EXERCISE IDEAS? Some moderate-intensity exercise ideas include:   Walking at a rate of 1 mile in 15 minutes.  Biking.  Hiking.  Golfing.  Dancing. Some vigorous-intensity exercise ideas include:   Walking at a rate of at least 4.5 miles per hour.  Jogging or running at a rate of 5 miles per hour.  Biking at a rate of at least 10 miles per hour.  Lap swimming.  Roller-skating or in-line skating.  Cross-country skiing.  Vigorous competitive sports, such as football, basketball, and soccer.  Jumping rope.  Aerobic dancing. WHAT ARE SOME EVERYDAY ACTIVITIES THAT CAN HELP ME TO GET EXERCISE?  Yard work, such as:  Child psychotherapist.  Raking and bagging leaves.  Washing and waxing your car.  Pushing a stroller.  Shoveling snow.  Gardening.  Washing windows or floors. HOW CAN I BE MORE ACTIVE IN MY DAY-TO-DAY ACTIVITIES?  Use the stairs instead of the elevator.  Take a walk during your lunch break.  If you drive, park your car farther away from work or school.  If you take public transportation, get off one stop early and walk the rest of the way.  Make all of your phone calls while standing up and walking around.  Get up, stretch, and walk around every 30 minutes throughout the day. WHAT GUIDELINES SHOULD I FOLLOW WHILE EXERCISING?  Do not exercise so much that you hurt yourself, feel dizzy, or get very short of breath.  Consult your health care provider before starting a new  exercise program.  Wear comfortable clothes and shoes with good support.  Drink plenty of water while you exercise to prevent dehydration or heat stroke. Body water is lost during exercise and must be replaced.  Work out until you breathe faster and your heart beats faster.   This information is not intended to replace advice given to you  by your health care provider. Make sure you discuss any questions you have with your health care provider.   Document Released: 02/03/2010 Document Revised: 01/22/2014 Document Reviewed: 06/04/2013 Elsevier Interactive Patient Education [employer]

## 2015-07-06 NOTE — Progress Notes (Signed)
   Subjective:    Patient ID: Christine Brooks, female    DOB: 03/23/1980, 35 y.o.   MRN: [id number]  HPI Chief Complaint  Patient presents with  . new pt    new pt, get established. UTI- pressure, pain, frequency   She is here to establish primary care. No previous primary care, states she normally goes to the ED or Urgent care for issues.  Complains today of a 1 month history of urinary frequency and pressure with urination.  States she went to the ED last week with urinary symptoms and had a negative urinalysis.   Denies fever, chills, abdominal pain, back pain, nausea, vomiting, diarrhea.   PMH: headaches- [facility]   Smokes 5 cigarettes per week. Denies alcohol or drug use.  States she does not hydrate well and does not exercise.   Lives with 582 kids 35 years old and 8717.  Works with [employer] customer [employer]. Works 2 jobs.   Reviewed allergies, medications, past medical, surgical and social history.     Review of Systems Pertinent positives and negatives in the history of present illness.     Objective:   Physical Exam  Constitutional: She appears well-developed and well-nourished. No distress.  Cardiovascular: Normal rate, regular rhythm and normal heart sounds.  Exam reveals no friction rub.   No murmur heard. Pulmonary/Chest: Effort normal and breath sounds normal.  Abdominal: Soft. Bowel sounds are normal. She exhibits no distension. There is no hepatosplenomegaly. There is no tenderness. There is no rebound, no guarding and no CVA tenderness.  Skin: Skin is warm and dry.  Psychiatric: She has a normal mood and affect. Her behavior is normal.   BP 120/70 mmHg  Pulse 64  Wt 111 lb 12.8 oz (50.712 kg)  LMP 05/27/2015  Urinalysis dipstick: negative Urine culture sent.      Assessment & Plan:  Dysuria - Plan: POCT urinalysis dipstick, Urine culture  Frequent urination - Plan: POCT urinalysis dipstick, Urine culture  Encounter to establish  care  Discussed that her urinalysis today is also negative and I will send for culture to confirm. If she continues to complain of urinary symptoms and nothing shows up on culture I will consider urology referral.  Discussed staying hydrated and getting more physical activity. Recommend returning for a complete physical.

## 2015-07-07 LAB — URINE CULTURE
COLONY COUNT: NO GROWTH
ORGANISM ID, BACTERIA: NO GROWTH

## 2015-08-16 ENCOUNTER — Ambulatory Visit (INDEPENDENT_AMBULATORY_CARE_PROVIDER_SITE_OTHER): Payer: BLUE CROSS/BLUE SHIELD | Admitting: Neurology

## 2015-08-16 ENCOUNTER — Encounter: Payer: Self-pay | Admitting: Neurology

## 2015-08-16 VITALS — BP 110/60 | HR 95 | Ht 61.0 in | Wt 111.0 lb

## 2015-08-16 DIAGNOSIS — G44219 Episodic tension-type headache, not intractable: Secondary | ICD-10-CM

## 2015-08-16 DIAGNOSIS — M5481 Occipital neuralgia: Secondary | ICD-10-CM | POA: Diagnosis not present

## 2015-08-16 MED ORDER — GABAPENTIN 100 MG PO CAPS: ORAL_CAPSULE | ORAL | 11 refills | Status: DC

## 2015-08-16 NOTE — Progress Notes (Signed)
NEUROLOGY FOLLOW UP OFFICE NOTE  Christine Brooks [id number]  HISTORY OF PRESENT ILLNESS: I had the pleasure of seeing Christine Brooks in follow-up in the neurology clinic on 08/16/2015. She was last seen more than a year ago for headaches suggestive of occipital neuralgia versus tension-type headaches with pain localized over the left parietal region. She had good response to low dose gabapentin 100mg  qhs and took this for 2 months, then stopped the medication. She came back in April 2016 to report headache recurrence and again restarted gabapentin. She reports that if she takes the medication, she does overall well, however became tearful in the office because she has been getting overwhelmed working 2 jobs. She keeps pushing herself then crashes, and reports missing 2 or 3 weeks of work in the past 2 months with headaches and whole body pains. Her last headache was yesterday, she had one the day prior. She reports missing a dose of the gabapentin. She denies any side effects on gabapentin 100mg  daily, but cannot take it in the daytime due to drowsiness. She does not sleep much, her hours at 9:30pm to 6am, then back to work from 8am to 1:30pm. She denies any dizziness except when she does not eat, no vision changes, focal numbness/tingling/weakness, no falls.   HPI: This is a 35 yo RH woman with no significant past medical history, who presented for new onset intermittent pressure-like pain over the left parietal region in February 2015. Pain lasts 30 minutes at a time, occurring multiple times daily, at times radiating to the left frontal region. When pain flares, there is occasional associated tingling throughout her whole body. She had to leave work due to pain. No family history of headaches.  Diagnostic Data: MRI without contrast was unremarkable, with 4 non-specific punctate T2 FLAIR hyperintensities over the bilateral white matter regions.   PAST MEDICAL HISTORY: Past Medical History:  Diagnosis  Date  . 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 gabapentin (NEURONTIN) 100 MG capsule Take 100 mg by mouth at bedtime                  No current facility-administered medications on file prior to visit.    ALLERGIES: No Known Allergies  FAMILY HISTORY: Family History  Problem Relation Age of Onset  . Ataxia Neg Hx   . Chorea Neg Hx   . Dementia Neg Hx   . Mental retardation Neg Hx   . Migraines Neg Hx   . Multiple sclerosis Neg Hx   . Neurofibromatosis Neg Hx   . Neuropathy Neg Hx   . Parkinsonism Neg Hx   . Seizures Neg Hx   . Stroke Neg Hx     SOCIAL HISTORY: Social History   Social History  . Marital status: Single    Spouse name: N/A  . Number of children: N/A  . Years of education: N/A   Occupational History  . Not on file.   Social History Main Topics  . Smoking status: Current Every Day Smoker  . Smokeless tobacco: Never Used  . Alcohol use 0.0 oz/week     Comment: rare  . Drug use: No  . Sexual activity: Yes    Partners: Male    Birth control/ protection: Condom   Other Topics Concern  . Not on file   Social History Narrative  . No narrative on file    REVIEW OF SYSTEMS: Constitutional:  No fevers, chills, or sweats, no generalized fatigue, change in appetite Eyes: No visual changes, double vision, eye pain Ear, nose and throat: No hearing loss, ear pain, nasal congestion, sore throat Cardiovascular: No chest pain, palpitations Respiratory:  No shortness of breath at rest or with exertion, wheezes GastrointestinaI: No nausea, vomiting, diarrhea, abdominal pain, fecal incontinence Genitourinary:  No dysuria, urinary retention or frequency Musculoskeletal:  No neck pain, back pain Integumentary: No rash, pruritus, skin lesions Neurological: as above Psychiatric: No depression, insomnia, anxiety Endocrine: No palpitations, fatigue, diaphoresis, mood  swings, change in appetite, change in weight, increased thirst Hematologic/Lymphatic:  No anemia, purpura, petechiae. Allergic/Immunologic: no itchy/runny eyes, nasal congestion, recent allergic reactions, rashes  PHYSICAL EXAM: Vitals:   08/16/15 1129  BP: 110/60  Pulse: 95   General: No acute distress, became tearful in the office due to work stress Head:  Normocephalic/atraumatic, no temporal or scalp tenderness to palpation Neck: supple, no paraspinal tenderness, full range of motion Heart:  Regular rate and rhythm Lungs:  Clear to auscultation bilaterally Back: No paraspinal tenderness Skin/Extremities: No rash, no edema Neurological Exam: alert and oriented to person, place, and time. No aphasia or dysarthria. Fund of knowledge is appropriate.  Recent and remote memory are intact.  Attention and concentration are normal.    Able to name objects and repeat phrases. Cranial nerves: Pupils equal, round, reactive to light.  Fundoscopic exam unremarkable, no papilledema. Extraocular movements intact with no nystagmus. Visual fields full. Facial sensation intact. No facial asymmetry. Tongue, uvula, palate midline.  Motor: Bulk and tone normal, muscle strength 5/5 throughout with no pronator drift.  Sensation to light touch intact.  No extinction to double simultaneous stimulation.  Deep tendon reflexes 2+ throughout, toes downgoing.  Finger to nose testing intact.  Gait narrow-based and steady, able to tandem walk adequately.  Romberg negative.  IMPRESSION: This is a 35 yo RH woman who presented for head pain localized over the left parietal region, radiating to the left frontal region. Neurological exam normal, MRI brain unremarkable. Considerations include tension-type headaches, occipital neuralgia. She had a good response to gabapentin  but reports several missed days of work in the past 2 months. She will increase to  daily. She is undergoing a lot of stress and would benefit from  doing day shift to get good sleep, which is likely affecting headache frequency. She knows to minimize Aleve intake to 2-3 a week to avoid rebound headaches. She will keep a headache diary and will follow-up in 6 months.  Thank you for allowing me to participate in her care.  Please do not hesitate to call for any questions or concerns.  The duration of this appointment visit was 25 minutes of face-to-face time with the patient.  Greater than 50% of this time was spent in counseling, explanation of diagnosis, planning of further management, and coordination of care.   Patrcia Dolly, M.D.

## 2015-08-16 NOTE — Patient Instructions (Signed)
1. Increase Gabapentin 100mg : Take 2 capsules daily. Monitor for drowsiness 2. Try to get as much good sleep as possible 3. Follow-up in 6 months, hang in there!

## 2015-08-17 DIAGNOSIS — Z029 Encounter for administrative examinations, unspecified: Secondary | ICD-10-CM

## 2015-09-13 ENCOUNTER — Telehealth: Payer: Self-pay | Admitting: Neurology

## 2015-09-13 NOTE — Telephone Encounter (Signed)
[name] May 22, 1980. She would like you to please call her at [phone], it is regarding paperwork that she will be faxing over to us. Thank you

## 2015-09-14 NOTE — Telephone Encounter (Signed)
Left vm to return our call 

## 2015-09-15 NOTE — Telephone Encounter (Signed)
Patient called and wanted to make sure that we are very detailed on her paperwork.  She needs day shift between 8 and 2.

## 2015-09-27 ENCOUNTER — Telehealth: Payer: Self-pay | Admitting: Neurology

## 2015-09-27 NOTE — Telephone Encounter (Signed)
Patient was calling to check on the status of her paperwork. Thank you

## 2015-09-28 NOTE — Telephone Encounter (Signed)
Notified pt paperwork is ready to be picked up and a copy will be faxed to [employer].

## 2015-10-08 ENCOUNTER — Encounter: Payer: Self-pay | Admitting: *Deleted

## 2015-10-13 ENCOUNTER — Ambulatory Visit: Payer: BLUE CROSS/BLUE SHIELD | Admitting: Obstetrics and Gynecology

## 2015-11-10 ENCOUNTER — Ambulatory Visit: Payer: Self-pay | Admitting: Family

## 2015-11-17 ENCOUNTER — Encounter: Payer: Self-pay | Admitting: Advanced Practice Midwife

## 2015-11-17 ENCOUNTER — Ambulatory Visit (INDEPENDENT_AMBULATORY_CARE_PROVIDER_SITE_OTHER): Payer: BLUE CROSS/BLUE SHIELD | Admitting: Advanced Practice Midwife

## 2015-11-17 VITALS — BP 112/73 | HR 103 | Ht 61.0 in | Wt 108.0 lb

## 2015-11-17 DIAGNOSIS — B379 Candidiasis, unspecified: Secondary | ICD-10-CM | POA: Diagnosis not present

## 2015-11-17 DIAGNOSIS — Z30013 Encounter for initial prescription of injectable contraceptive: Secondary | ICD-10-CM

## 2015-11-17 DIAGNOSIS — R399 Unspecified symptoms and signs involving the genitourinary system: Secondary | ICD-10-CM

## 2015-11-17 DIAGNOSIS — Z720 Tobacco use: Secondary | ICD-10-CM

## 2015-11-17 DIAGNOSIS — Z01419 Encounter for gynecological examination (general) (routine) without abnormal findings: Secondary | ICD-10-CM | POA: Diagnosis not present

## 2015-11-17 DIAGNOSIS — B373 Candidiasis of vulva and vagina: Secondary | ICD-10-CM | POA: Diagnosis not present

## 2015-11-17 DIAGNOSIS — Z113 Encounter for screening for infections with a predominantly sexual mode of transmission: Secondary | ICD-10-CM

## 2015-11-17 LAB — POCT URINALYSIS DIPSTICK
BILIRUBIN UA: NEGATIVE
Blood, UA: 250
Glucose, UA: NEGATIVE
KETONES UA: NEGATIVE
LEUKOCYTES UA: NEGATIVE
Nitrite, UA: NEGATIVE
PH UA: 5
SPEC GRAV UA: 1.025
Urobilinogen, UA: NEGATIVE

## 2015-11-17 MED ORDER — MEDROXYPROGESTERONE ACETATE 150 MG/ML IM SUSP: 150.0000 mg | INTRAMUSCULAR | 3 refills | Status: DC

## 2015-11-17 MED ORDER — FLUCONAZOLE 150 MG PO TABS: 150.0000 mg | ORAL_TABLET | Freq: Once | ORAL | 4 refills | Status: DC | PRN

## 2015-11-17 NOTE — Patient Instructions (Signed)
Medroxyprogesterone injection [Contraceptive] What is this medicine? MEDROXYPROGESTERONE (me DROX ee proe JES te rone) contraceptive injections prevent pregnancy. They provide effective birth control for 3 months. Depo-subQ Provera 104 is also used for treating pain related to endometriosis. This medicine may be used for other purposes; ask your health care provider or pharmacist if you have questions. What should I tell my health care provider before I take this medicine? They need to know if you have any of these conditions: -frequently drink alcohol -asthma -blood vessel disease or a history of a blood clot in the lungs or legs -bone disease such as osteoporosis -breast cancer -diabetes -eating disorder (anorexia nervosa or bulimia) -high blood pressure -HIV infection or AIDS -kidney disease -liver disease -mental depression -migraine -seizures (convulsions) -stroke -tobacco smoker -vaginal bleeding -an unusual or allergic reaction to medroxyprogesterone, other hormones, medicines, foods, dyes, or preservatives -pregnant or trying to get pregnant -breast-feeding How should I use this medicine? Depo-Provera Contraceptive injection is given into a muscle. Depo-subQ Provera 104 injection is given under the skin. These injections are given by a health care professional. You must not be pregnant before getting an injection. The injection is usually given during the first 5 days after the start of a menstrual period or 6 weeks after delivery of a baby. Talk to your pediatrician regarding the use of this medicine in children. Special care may be needed. These injections have been used in female children who have started having menstrual periods. Overdosage: If you think you have taken too much of this medicine contact a poison control center or emergency room at once. NOTE: This medicine is only for you. Do not share this medicine with others. What if I miss a dose? Try not to miss a  dose. You must get an injection once every 3 months to maintain birth control. If you cannot keep an appointment, call and reschedule it. If you wait longer than 13 weeks between Depo-Provera contraceptive injections or longer than 14 weeks between Depo-subQ Provera 104 injections, you could get pregnant. Use another method for birth control if you miss your appointment. You may also need a pregnancy test before receiving another injection. What may interact with this medicine? Do not take this medicine with any of the following medications: -bosentan This medicine may also interact with the following medications: -aminoglutethimide -antibiotics or medicines for infections, especially rifampin, rifabutin, rifapentine, and griseofulvin -aprepitant -barbiturate medicines such as phenobarbital or primidone -bexarotene -carbamazepine -medicines for seizures like ethotoin, felbamate, oxcarbazepine, phenytoin, topiramate -modafinil -St. John's wort This list may not describe all possible interactions. Give your health care provider a list of all the medicines, herbs, non-prescription drugs, or dietary supplements you use. Also tell them if you smoke, drink alcohol, or use illegal drugs. Some items may interact with your medicine. What should I watch for while using this medicine? This drug does not protect you against HIV infection (AIDS) or other sexually transmitted diseases. Use of this product may cause you to lose calcium from your bones. Loss of calcium may cause weak bones (osteoporosis). Only use this product for more than 2 years if other forms of birth control are not right for you. The longer you use this product for birth control the more likely you will be at risk for weak bones. Ask your health care professional how you can keep strong bones. You may have a change in bleeding pattern or irregular periods. Many females stop having periods while taking this drug. If you have   received your  injections on time, your chance of being pregnant is very low. If you think you may be pregnant, see your health care professional as soon as possible. Tell your health care professional if you want to get pregnant within the next year. The effect of this medicine may last a long time after you get your last injection. What side effects may I notice from receiving this medicine? Side effects that you should report to your doctor or health care professional as soon as possible: -allergic reactions like skin rash, itching or hives, swelling of the face, lips, or tongue -breast tenderness or discharge -breathing problems -changes in vision -depression -feeling faint or lightheaded, falls -fever -pain in the abdomen, chest, groin, or leg -problems with balance, talking, walking -unusually weak or tired -yellowing of the eyes or skin Side effects that usually do not require medical attention (report to your doctor or health care professional if they continue or are bothersome): -acne -fluid retention and swelling -headache -irregular periods, spotting, or absent periods -temporary pain, itching, or skin reaction at site where injected -weight gain This list may not describe all possible side effects. Call your doctor for medical advice about side effects. You may report side effects to FDA at 1-800-FDA-1088. Where should I keep my medicine? This does not apply. The injection will be given to you by a health care professional. NOTE: This sheet is a summary. It may not cover all possible information. If you have questions about this medicine, talk to your doctor, pharmacist, or health care provider.    2016, Elsevier/Gold Standard. (2008-01-23 18:37:56)  Smoking Cessation, Tips for Success If you are ready to quit smoking, congratulations! You have chosen to help yourself be healthier. Cigarettes bring nicotine, tar, carbon monoxide, and other irritants into your body. Your lungs, heart, and  blood vessels will be able to work better without these poisons. There are many different ways to quit smoking. Nicotine gum, nicotine patches, a nicotine inhaler, or nicotine nasal spray can help with physical craving. Hypnosis, support groups, and medicines help break the habit of smoking. WHAT THINGS CAN I DO TO MAKE QUITTING EASIER?  Here are some tips to help you quit for good:  Pick a date when you will quit smoking completely. Tell all of your friends and family about your plan to quit on that date.  Do not try to slowly cut down on the number of cigarettes you are smoking. Pick a quit date and quit smoking completely starting on that day.  Throw away all cigarettes.   Clean and remove all ashtrays from your home, work, and car.  On a card, write down your reasons for quitting. Carry the card with you and read it when you get the urge to smoke.  Cleanse your body of nicotine. Drink enough water and fluids to keep your urine clear or pale yellow. Do this after quitting to flush the nicotine from your body.  Learn to predict your moods. Do not let a bad situation be your excuse to have a cigarette. Some situations in your life might tempt you into wanting a cigarette.  Never have "just one" cigarette. It leads to wanting another and another. Remind yourself of your decision to quit.  Change habits associated with smoking. If you smoked while driving or when feeling stressed, try other activities to replace smoking. Stand up when drinking your coffee. Brush your teeth after eating. Sit in a different chair when you read the paper. Avoid   alcohol while trying to quit, and try to drink fewer caffeinated beverages. Alcohol and caffeine may urge you to smoke.  Avoid foods and drinks that can trigger a desire to smoke, such as sugary or spicy foods and alcohol.  Ask people who smoke not to smoke around you.  Have something planned to do right after eating or having a cup of coffee. For  example, plan to take a walk or exercise.  Try a relaxation exercise to calm you down and decrease your stress. Remember, you may be tense and nervous for the first 2 weeks after you quit, but this will pass.  Find new activities to keep your hands busy. Play with a pen, coin, or rubber band. Doodle or draw things on paper.  Brush your teeth right after eating. This will help cut down on the craving for the taste of tobacco after meals. You can also try mouthwash.   Use oral substitutes in place of cigarettes. Try using lemon drops, carrots, cinnamon sticks, or chewing gum. Keep them handy so they are available when you have the urge to smoke.  When you have the urge to smoke, try deep breathing.  Designate your home as a nonsmoking area.  If you are a heavy smoker, ask your health care provider about a prescription for nicotine chewing gum. It can ease your withdrawal from nicotine.  Reward yourself. Set aside the cigarette money you save and buy yourself something nice.  Look for support from others. Join a support group or smoking cessation program. Ask someone at home or at work to help you with your plan to quit smoking.  Always ask yourself, "Do I need this cigarette or is this just a reflex?" Tell yourself, "Today, I choose not to smoke," or "I do not want to smoke." You are reminding yourself of your decision to quit.  Do not replace cigarette smoking with electronic cigarettes (commonly called e-cigarettes). The safety of e-cigarettes is unknown, and some may contain harmful chemicals.  If you relapse, do not give up! Plan ahead and think about what you will do the next time you get the urge to smoke. HOW WILL I FEEL WHEN I QUIT SMOKING? You may have symptoms of withdrawal because your body is used to nicotine (the addictive substance in cigarettes). You may crave cigarettes, be irritable, feel very hungry, cough often, get headaches, or have difficulty concentrating. The  withdrawal symptoms are only temporary. They are strongest when you first quit but will go away within 10-14 days. When withdrawal symptoms occur, stay in control. Think about your reasons for quitting. Remind yourself that these are signs that your body is healing and getting used to being without cigarettes. Remember that withdrawal symptoms are easier to treat than the major diseases that smoking can cause.  Even after the withdrawal is over, expect periodic urges to smoke. However, these cravings are generally short lived and will go away whether you smoke or not. Do not smoke! WHAT RESOURCES ARE AVAILABLE TO HELP ME QUIT SMOKING? Your health care provider can direct you to community resources or hospitals for support, which may include:  Group support.  Education.  Hypnosis.  Therapy.   This information is not intended to replace advice given to you by your health care provider. Make sure you discuss any questions you have with your health care provider.   Document Released: 09/30/2003 Document Revised: 01/22/2014 Document Reviewed: 06/19/2012 Elsevier Interactive Patient Education 2016 Elsevier Inc.  

## 2015-11-17 NOTE — Progress Notes (Signed)
89Subjective:     Christine Brooks is a 35 y.o. female here for a routine exam.  Current complaints: vaginal irritation and urinary frequency around periods.  Personal health questionnaire reviewed: not asked.   Gynecologic History Patient's last menstrual period was 11/09/2015. Contraception: condoms Last Pap: 11/29/15. Results were: normal Last mammogram: 2013. Results were: normal  Obstetric History OB History  Gravida Para Term Preterm AB Living  3 2 2   1 2   SAB TAB Ectopic Multiple Live Births    1     2    # Outcome Date GA Lbr Len/2nd Weight Sex Delivery Anes PTL Lv  3 TAB 06/16/14          2 Term 07/29/07    M CS-LTranv   LIV  1 Term 12/07/97 [id number]   M CS-LTranv   LIV       The following portions of the patient's history were reviewed and updated as appropriate: allergies, current medications, past family history, past medical history, past social history, past surgical history and problem list.  Review of Systems Pertinent items are noted in HPI.    Objective:    BP 112/73   Pulse (!) 103   Ht 5\' 1"  (1.549 m)   Wt 108 lb (49 kg)   LMP 11/09/2015   BMI 20.41 kg/m   General Appearance:    Alert, cooperative, no distress, appears stated age  Head:    Normocephalic, without obvious abnormality, atraumatic  Eyes:    PERRL, conjunctiva/corneas clear  Throat:   Lips, mucosa, and tongue normal  Neck:   Supple, symmetrical, trachea midline, no adenopathy;    thyroid:  no enlargement/tenderness/nodules  Lungs:     Clear to auscultation bilaterally, respirations unlabored  Chest Wall:    No tenderness or deformity   Heart:    Regular rate and rhythm, S1 and S2 normal, no murmur, rub   or gallop  Breast Exam:    No tenderness, masses, or nipple abnormality. No lymphadenopathy   Abdomen:     Soft, non-tender, bowel sounds active all four quadrants,    no masses, no organomegaly  Genitalia:    Normal female without lesion or tenderness. Moderate amount of thick, white,  odorless, discharge  Extremities:   Extremities normal, atraumatic, no cyanosis or edema  Skin:   Skin color, texture, turgor normal, no rashes or lesions  Neurologic:   Grossly normal, normal strength throughout       Assessment:    Healthy female exam.   1. UTI symptoms  - POCT urinalysis dipstick - Urine culture  2. Yeast infection  - NuSwab VG+, Candida 6sp - fluconazole (DIFLUCAN) 150 MG tablet; Take 1 tablet (150 mg total) by mouth once as needed. Take second tablet if no improvement in 3 days.  Dispense: 2 tablet; Refill: 4  3. Screen for STD (sexually transmitted disease)   4. Encounter for initial prescription of injectable contraceptive  - medroxyPROGESTERone (DEPO-PROVERA) 150 MG/ML injection; Inject 1 mL (150 mg total) into the muscle every 3 (three) months.  Dispense: 1 mL; Refill: 3  5. Well female exam with routine gynecological exam     Plan:    Education reviewed: safe sex/STD prevention, self breast exams and smoking cessation. Contraception: Depo-Provera injections. Follow up in: 12  months.  Discussed current Pap guidelines. Too early to do one today (<1 year).   [place] Derric Dealmeida, CNM 11/17/2015 2:52 PM

## 2015-11-19 LAB — URINE CULTURE

## 2015-11-21 ENCOUNTER — Telehealth: Payer: Self-pay | Admitting: *Deleted

## 2015-11-21 DIAGNOSIS — A599 Trichomoniasis, unspecified: Secondary | ICD-10-CM

## 2015-11-21 LAB — NUSWAB VG+, CANDIDA 6SP
CANDIDA ALBICANS, NAA: POSITIVE — AB
CANDIDA GLABRATA, NAA: NEGATIVE
CANDIDA KRUSEI, NAA: NEGATIVE
CANDIDA LUSITANIAE, NAA: NEGATIVE
CANDIDA TROPICALIS, NAA: NEGATIVE
Candida parapsilosis, NAA: NEGATIVE
Chlamydia trachomatis, NAA: NEGATIVE
Neisseria gonorrhoeae, NAA: NEGATIVE
Trich vag by NAA: POSITIVE — AB

## 2015-11-21 MED ORDER — METRONIDAZOLE 500 MG PO TABS: ORAL_TABLET | ORAL | 0 refills | Status: DC

## 2015-11-21 NOTE — Telephone Encounter (Signed)
Patient called to let us know that her yeast symptoms are not getting better after the medication- maybe she needs more- told her I would check her labs first. 12:23 Patient informed that she has Ivery Qualerich- she is surprised. Patient instructed on treatment an she will follow up for recheck. Patient is also aware that her BV results are not back yet. Treatment forwarded to her pharmacy.

## 2015-12-01 ENCOUNTER — Encounter: Payer: Self-pay | Admitting: Obstetrics

## 2015-12-01 ENCOUNTER — Ambulatory Visit (INDEPENDENT_AMBULATORY_CARE_PROVIDER_SITE_OTHER): Payer: BLUE CROSS/BLUE SHIELD | Admitting: Obstetrics

## 2015-12-01 VITALS — BP 109/76 | HR 91 | Temp 98.6°F | Wt 108.8 lb

## 2015-12-01 DIAGNOSIS — Z113 Encounter for screening for infections with a predominantly sexual mode of transmission: Secondary | ICD-10-CM

## 2015-12-01 DIAGNOSIS — Z124 Encounter for screening for malignant neoplasm of cervix: Secondary | ICD-10-CM | POA: Diagnosis not present

## 2015-12-01 DIAGNOSIS — A599 Trichomoniasis, unspecified: Secondary | ICD-10-CM

## 2015-12-01 DIAGNOSIS — Z01419 Encounter for gynecological examination (general) (routine) without abnormal findings: Secondary | ICD-10-CM | POA: Diagnosis not present

## 2015-12-01 DIAGNOSIS — Z1151 Encounter for screening for human papillomavirus (HPV): Secondary | ICD-10-CM

## 2015-12-01 DIAGNOSIS — Z3042 Encounter for surveillance of injectable contraceptive: Secondary | ICD-10-CM | POA: Diagnosis not present

## 2015-12-01 MED ORDER — METRONIDAZOLE 500 MG PO TABS: ORAL_TABLET | ORAL | 0 refills | Status: DC

## 2015-12-01 NOTE — Progress Notes (Signed)
Subjective:        Christine Brooks is a 35 y.o. female here for a routine exam.  Current complaints: Vaginal irritation, odor and discharge.  Recently treated for Trichomonas.  No longer with partner that STD contracted from.  Personal health questionnaire:  Is patient [name], have a family history of breast and/or ovarian cancer: no Is there a family history of uterine cancer diagnosed at age < 7550, gastrointestinal cancer, urinary tract cancer, family member who is a Personnel officerLynch syndrome-associated carrier: no Is the patient overweight and hypertensive, family history of diabetes, personal history of gestational diabetes, preeclampsia or PCOS: no Is patient over 6155, have PCOS,  family history of premature CHD under age 35, diabetes, smoke, have hypertension or peripheral artery disease:  no At any time, has a partner hit, kicked or otherwise hurt or frightened you?: no Over the past 2 weeks, have you felt down, depressed or hopeless?: no Over the past 2 weeks, have you felt little interest or pleasure in doing things?:no   Gynecologic History Patient's last menstrual period was 11/09/2015. Contraception: Depo-Provera injections Last Pap: 2015. Results were: normal Last mammogram: n/a. Results were: n/a  Obstetric History OB History  Gravida Para Term Preterm AB Living  3 2 2   1 2   SAB TAB Ectopic Multiple Live Births    1     2    # Outcome Date GA Lbr Len/2nd Weight Sex Delivery Anes PTL Lv  3 TAB 06/16/14          2 Term 07/29/07    M CS-LTranv   LIV  1 Term 12/07/97 [id number]   M CS-LTranv   LIV      Past Medical History:  Diagnosis Date  . ZOXWRUEA(540.9Headache(784.0)     Past Surgical History:  Procedure Laterality Date  . CESAREAN SECTION       Current Outpatient Prescriptions:  .  medroxyPROGESTERone (DEPO-PROVERA) 150 MG/ML injection, Inject 1 mL (150 mg total) into the muscle every 3 (three) months., Disp: 1 mL, Rfl: 3 .  metroNIDAZOLE (FLAGYL) 500 MG tablet, Take 2  grams by mouth as 1 dose, Disp: 4 tablet, Rfl: 0 .  Prenat-Methylfol-Chol-Fish Oil (PRENATAL + COMPLETE MULTI PO), Take by mouth., Disp: , Rfl:  .  fluconazole (DIFLUCAN) 150 MG tablet, Take 1 tablet (150 mg total) by mouth once as needed. Take second tablet if no improvement in 3 days. (Patient not taking: Reported on 12/01/2015), Disp: 2 tablet, Rfl: 4 .  gabapentin (NEURONTIN) 100 MG capsule, Take 2 capsules at bedtime (Patient not taking: Reported on 12/01/2015), Disp: 60 capsule, Rfl: 11 No Known Allergies  Social History  Substance Use Topics  . Smoking status: Current Some Day Smoker  . Smokeless tobacco: Never Used  . Alcohol use No     Comment: rare    Family History  Problem Relation Age of Onset  . Ataxia Neg Hx   . Chorea Neg Hx   . Dementia Neg Hx   . Mental retardation Neg Hx   . Migraines Neg Hx   . Multiple sclerosis Neg Hx   . Neurofibromatosis Neg Hx   . Neuropathy Neg Hx   . Parkinsonism Neg Hx   . Seizures Neg Hx   . Stroke Neg Hx       Review of Systems  Constitutional: negative for fatigue and weight loss Respiratory: negative for cough and wheezing Cardiovascular: negative for chest pain, fatigue and palpitations Gastrointestinal: negative for abdominal pain  and change in bowel habits Musculoskeletal:negative for myalgias Neurological: negative for gait problems and tremors Behavioral/Psych: negative for abusive relationship, depression Endocrine: negative for temperature intolerance    Genitourinary:negative for abnormal menstrual periods, genital lesions, hot flashes, sexual problems and vaginal discharge Integument/breast: negative for breast lump, breast tenderness, nipple discharge and skin lesion(s)    Objective:       BP 109/76   Pulse 91   Temp 98.6 F (37 C) (Oral)   Wt 108 lb 12.8 oz (49.4 kg)   LMP 11/09/2015   BMI 20.56 kg/m  General:   alert  Skin:   no rash or abnormalities  Lungs:   clear to auscultation bilaterally  Heart:    regular rate and rhythm, S1, S2 normal, no murmur, click, rub or gallop  Breasts:   normal without suspicious masses, skin or nipple changes or axillary nodes  Abdomen:  normal findings: no organomegaly, soft, non-tender and no hernia  Pelvis:  External genitalia: normal general appearance Urinary system: urethral meatus normal and bladder without fullness, nontender Vaginal: normal without tenderness, induration or masses Cervix: normal appearance Adnexa: normal bimanual exam Uterus: anteverted and non-tender, normal size   Lab Review Urine pregnancy test Labs reviewed yes Radiologic studies reviewed no  50% of 20 min visit spent on counseling and coordination of care.    Assessment:    Healthy female exam.    History of Trichomonas Vaginitis, treated.   Plan:    Wet prep and cultures repeated.  Will treat with positive results.  Education reviewed: low fat, low cholesterol diet, safe sex/STD prevention, self breast exams, smoking cessation and weight bearing exercise. Contraception: Depo-Provera injections. Follow up in: 1 year.   Meds ordered this encounter  Medications  . metroNIDAZOLE (FLAGYL) 500 MG tablet    Sig: Take 2 grams by mouth as 1 dose    Dispense:  4 tablet    Refill:  0   No orders of the defined types were placed in this encounter.

## 2015-12-02 ENCOUNTER — Encounter: Payer: Self-pay | Admitting: *Deleted

## 2015-12-05 ENCOUNTER — Other Ambulatory Visit: Payer: Self-pay | Admitting: Obstetrics

## 2015-12-05 ENCOUNTER — Telehealth: Payer: Self-pay | Admitting: *Deleted

## 2015-12-05 DIAGNOSIS — N3000 Acute cystitis without hematuria: Secondary | ICD-10-CM

## 2015-12-05 MED ORDER — NITROFURANTOIN MONOHYD MACRO 100 MG PO CAPS: 100.0000 mg | ORAL_CAPSULE | Freq: Two times a day (BID) | ORAL | 2 refills | Status: DC

## 2015-12-05 NOTE — Telephone Encounter (Signed)
Macrobid Rx 

## 2015-12-05 NOTE — Telephone Encounter (Signed)
Patient is calling to reports her urinary symptoms are still present and she was wanting to make sure her test results were clear. Preliminary results are negative for STI but yeast and BV are not resulted yet. Patient states she has pain and pressure with urination- but her urine test are always negative. Told patient I would discuss with provider and let her know what we need to do.

## 2015-12-06 LAB — CYTOLOGY - PAP
Diagnosis: NEGATIVE
HPV: NOT DETECTED

## 2015-12-06 NOTE — Telephone Encounter (Signed)
Patient notified

## 2015-12-07 LAB — NUSWAB VG+, CANDIDA 6SP
CANDIDA ALBICANS, NAA: NEGATIVE
CANDIDA LUSITANIAE, NAA: NEGATIVE
CHLAMYDIA TRACHOMATIS, NAA: NEGATIVE
Candida glabrata, NAA: NEGATIVE
Candida krusei, NAA: NEGATIVE
Candida parapsilosis, NAA: NEGATIVE
Candida tropicalis, NAA: NEGATIVE
NEISSERIA GONORRHOEAE, NAA: NEGATIVE
Trich vag by NAA: NEGATIVE

## 2015-12-12 ENCOUNTER — Telehealth: Payer: Self-pay | Admitting: *Deleted

## 2015-12-12 ENCOUNTER — Encounter (HOSPITAL_COMMUNITY): Payer: Self-pay | Admitting: *Deleted

## 2015-12-12 ENCOUNTER — Inpatient Hospital Stay (HOSPITAL_COMMUNITY)
Admission: AD | Admit: 2015-12-12 | Discharge: 2015-12-12 | Disposition: A | Discharge status: Home / Self Care | Payer: BLUE CROSS/BLUE SHIELD | Source: Ambulatory Visit | Attending: Obstetrics and Gynecology | Admitting: Obstetrics and Gynecology

## 2015-12-12 DIAGNOSIS — Z3202 Encounter for pregnancy test, result negative: Secondary | ICD-10-CM | POA: Diagnosis not present

## 2015-12-12 DIAGNOSIS — R51 Headache: Secondary | ICD-10-CM | POA: Diagnosis not present

## 2015-12-12 DIAGNOSIS — R350 Frequency of micturition: Secondary | ICD-10-CM | POA: Insufficient documentation

## 2015-12-12 DIAGNOSIS — R3989 Other symptoms and signs involving the genitourinary system: Secondary | ICD-10-CM | POA: Diagnosis not present

## 2015-12-12 DIAGNOSIS — F17200 Nicotine dependence, unspecified, uncomplicated: Secondary | ICD-10-CM | POA: Diagnosis not present

## 2015-12-12 DIAGNOSIS — R102 Pelvic and perineal pain: Secondary | ICD-10-CM | POA: Diagnosis not present

## 2015-12-12 DIAGNOSIS — N946 Dysmenorrhea, unspecified: Secondary | ICD-10-CM | POA: Insufficient documentation

## 2015-12-12 LAB — WET PREP, GENITAL
Clue Cells Wet Prep HPF POC: NONE SEEN
SPERM: NONE SEEN
TRICH WET PREP: NONE SEEN
YEAST WET PREP: NONE SEEN

## 2015-12-12 LAB — POCT PREGNANCY, URINE: Preg Test, Ur: NEGATIVE

## 2015-12-12 LAB — URINE MICROSCOPIC-ADD ON
Bacteria, UA: NONE SEEN
WBC, UA: NONE SEEN WBC/hpf (ref 0–5)

## 2015-12-12 LAB — URINALYSIS, ROUTINE W REFLEX MICROSCOPIC
Bilirubin Urine: NEGATIVE
Glucose, UA: NEGATIVE mg/dL
Ketones, ur: NEGATIVE mg/dL
Leukocytes, UA: NEGATIVE
NITRITE: NEGATIVE
Protein, ur: NEGATIVE mg/dL
SPECIFIC GRAVITY, URINE: 1.02 (ref 1.005–1.030)
pH: 8 (ref 5.0–8.0)

## 2015-12-12 MED ORDER — IBUPROFEN 600 MG PO TABS
600.0000 mg | ORAL_TABLET | Freq: Once | ORAL | Status: AC
  Administered 2015-12-12: 600 mg via ORAL
  Filled 2015-12-12: qty 1

## 2015-12-12 MED ORDER — IBUPROFEN 600 MG PO TABS: 600.0000 mg | ORAL_TABLET | Freq: Four times a day (QID) | ORAL | 1 refills | Status: DC | PRN

## 2015-12-12 NOTE — MAU Provider Note (Signed)
Chief Complaint:  Abdominal Pain   First Provider Initiated Contact with Patient 12/12/15 1810     HPI: Christine Brooks is a 35 y.o. [id number] who presents to maternity admissions reporting pressure when she urinates. States it is worse when she has her period. Has had evaluations for UTI which were negative. Was treated for Trich recently .  Has not tried ibuprofen for pain. . She reports no vaginal bleeding, vaginal itching/burning, urinary symptoms, h/a, dizziness, n/v, or fever/chills.    Dysuria   This is a recurrent problem. The current episode started more than 1 year ago. The problem occurs every urination. The problem has been unchanged. The patient is experiencing no pain (pressure). She is sexually active. There is no history of pyelonephritis. Pertinent negatives include no chills, discharge, flank pain, frequency, hematuria, hesitancy, nausea, sweats, urgency or vomiting. She has tried nothing for the symptoms.   RN Note: Pt presents to MAU with complaints of pressure in her vaginal area with urine frequency. Denies any vaginal bleeding or abnormal discharge  Past Medical History: Past Medical History:  Diagnosis Date  . LKGMWNUU(725.3Headache(784.0)     Past obstetric history: OB History  Gravida Para Term Preterm AB Living  3 2 2   1 2   SAB TAB Ectopic Multiple Live Births    1     2    # Outcome Date GA Lbr Len/2nd Weight Sex Delivery Anes PTL Lv  3 TAB 06/16/14          2 Term 07/29/07    M CS-LTranv   LIV  1 Term 12/07/97 [id number]   M CS-LTranv   LIV      Past Surgical History: Past Surgical History:  Procedure Laterality Date  . CESAREAN SECTION    . DILATION AND CURETTAGE OF UTERUS      Family History: Family History  Problem Relation Age of Onset  . Ataxia Neg Hx   . Chorea Neg Hx   . Dementia Neg Hx   . Mental retardation Neg Hx   . Migraines Neg Hx   . Multiple sclerosis Neg Hx   . Neurofibromatosis Neg Hx   . Neuropathy Neg Hx   . Parkinsonism Neg Hx   .  Seizures Neg Hx   . Stroke Neg Hx     Social History: Social History  Substance Use Topics  . Smoking status: Current Some Day Smoker  . Smokeless tobacco: Never Used  . Alcohol use No     Comment: rare    Allergies: No Known Allergies  Meds:  Prescriptions Prior to Admission  Medication Sig Dispense Refill Last Dose  . fluconazole (DIFLUCAN) 150 MG tablet Take 1 tablet (150 mg total) by mouth once as needed. Take second tablet if no improvement in 3 days. (Patient not taking: Reported on 12/01/2015) 2 tablet 4 Not Taking  . gabapentin (NEURONTIN) 100 MG capsule Take 2 capsules at bedtime (Patient not taking: Reported on 12/01/2015) 60 capsule 11 Not Taking  . medroxyPROGESTERone (DEPO-PROVERA) 150 MG/ML injection Inject 1 mL (150 mg total) into the muscle every 3 (three) months. 1 mL 3 Taking  . metroNIDAZOLE (FLAGYL) 500 MG tablet Take 2 grams by mouth as 1 dose 4 tablet 0   . nitrofurantoin, macrocrystal-monohydrate, (MACROBID) 100 MG capsule Take 1 capsule (100 mg total) by mouth 2 (two) times daily. 14 capsule 2   . Prenat-Methylfol-Chol-Fish Oil (PRENATAL + COMPLETE MULTI PO) Take by mouth.   Taking    I have  reviewed patient's Past Medical Hx, Surgical Hx, Family Hx, Social Hx, medications and allergies.  ROS:  Review of Systems  Constitutional: Negative for chills.  Gastrointestinal: Negative for nausea and vomiting.  Genitourinary: Positive for dysuria. Negative for flank pain, frequency, hematuria, hesitancy and urgency.   Other systems negative     Physical Exam  Patient Vitals for the past 24 hrs:  BP Temp Pulse Resp Height Weight  12/12/15 1645 132/85 98.6 F (37 C) 89 18 5' (1.524 m) 109 lb (49.4 kg)   Constitutional: Well-developed, well-nourished female in no acute distress.  Cardiovascular: normal rate and rhythm, no ectopy audible, S1 & S2 heard, no murmur Respiratory: normal effort, no distress. Lungs CTAB with no wheezes or crackles GI: Abd soft,  non-tender.  Nondistended.  No rebound, No guarding.  Bowel Sounds audible  MS: Extremities nontender, no edema, normal ROM Neurologic: Alert and oriented x 4.   Grossly nonfocal. GU: Neg CVAT. Skin:  Warm and Dry Psych:  Affect appropriate.  PELVIC EXAM: Cervix closed. No cystocele or prolapse appreciated   Labs:    Results for orders placed or performed during the hospital encounter of 12/12/15 (from the past 72 hour(s))  Urinalysis, Routine w reflex microscopic (not [facility])     Status: Abnormal   Collection Time: 12/12/15  5:46 PM  Result Value Ref Range   Color, Urine YELLOW YELLOW   APPearance HAZY (A) CLEAR   Specific Gravity, Urine 1.020 1.005 - 1.030   pH 8.0 5.0 - 8.0   Glucose, UA NEGATIVE NEGATIVE mg/dL   Hgb urine dipstick SMALL (A) NEGATIVE   Bilirubin Urine NEGATIVE NEGATIVE   Ketones, ur NEGATIVE NEGATIVE mg/dL   Protein, ur NEGATIVE NEGATIVE mg/dL   Nitrite NEGATIVE NEGATIVE   Leukocytes, UA NEGATIVE NEGATIVE  Urine microscopic-add on     Status: Abnormal   Collection Time: 12/12/15  5:46 PM  Result Value Ref Range   Squamous Epithelial / LPF 0-5 (A) NONE SEEN   WBC, UA NONE SEEN 0 - 5 WBC/hpf   RBC / HPF 0-5 0 - 5 RBC/hpf   Bacteria, UA NONE SEEN NONE SEEN   Urine-Other AMORPHOUS URATES/PHOSPHATES   Pregnancy, urine POC     Status: None   Collection Time: 12/12/15  5:55 PM  Result Value Ref Range   Preg Test, Ur NEGATIVE NEGATIVE    Comment:        THE SENSITIVITY OF THIS METHODOLOGY IS >24 mIU/mL   Wet prep, genital     Status: Abnormal   Collection Time: 12/12/15  6:22 PM  Result Value Ref Range   Yeast Wet Prep HPF POC NONE SEEN NONE SEEN   Trich, Wet Prep NONE SEEN NONE SEEN   Clue Cells Wet Prep HPF POC NONE SEEN NONE SEEN   WBC, Wet Prep HPF POC FEW (A) NONE SEEN   Sperm NONE SEEN      Imaging:  No results found.  MAU Course/MDM: I have ordered labs as follows: see above Imaging ordered: none Results reviewed. No evidence of UTI.  Discussed there are other urinary conditions which can cause pressure and she needs to be formally evaluated by Urology with urodynamics and other methods   Treatments in MAU included none.  Wants a pelvic US.  Discussed this is not an emergent procedure given negative exam findings. Will schedule as outpatient with f/u in office. To rule out uterine fibroids, etc.  Suspect some of the discomfort may be dysmenorrhea Pt stable at  time of discharge.  Assessment: Bladder pain - Plan: US Pelvis Complete, US Transvaginal Non-OB  Dysmenorrhea - Plan: US Pelvis Complete, US Transvaginal Non-OB    Plan: Discharge home Recommend Follow up with Urology Rx sent for ibuprofen for dysmenorrhea   Encouraged to return here or to other Urgent Care/ED if she develops worsening of symptoms, increase in pain, fever, or other concerning symptoms.   Wynelle Bourgeois CNM, MSN Certified Nurse-Midwife 12/12/2015 6:11 PM

## 2015-12-12 NOTE — Discharge Instructions (Signed)
Dysmenorrhea Dysmenorrhea is pain during a menstrual period. You will have pain in the lower belly (abdomen). The pain is caused by the tightening (contracting) of the muscles of the uterus. The pain can be minor or severe. Headache, feeling sick to your stomach (nausea), throwing up (vomiting), or low back pain may occur with this condition. Follow these instructions at home:  Only take medicine as told by your doctor.  Place a heating pad or hot water bottle on your lower back or belly. Do not sleep with a heating pad.  Exercise may help lessen the pain.  Massage the lower back or belly.  Stop smoking.  Avoid alcohol and caffeine. Contact a doctor if:  Your pain does not get better with medicine.  You have pain during sex.  Your pain gets worse while taking pain medicine.  Your period bleeding is heavier than normal.  You keep feeling sick to your stomach or keep throwing up. Get help right away if:  You pass out (faint). This information is not intended to replace advice given to you by your health care provider. Make sure you discuss any questions you have with your health care provider. Document Released: 03/30/2008 Document Revised: 06/09/2015 Document Reviewed: 06/19/2012 Elsevier Interactive Patient Education  2017 [employer] Urinary Frequency The number of times a normal person urinates depends upon how much liquid they take in and how much liquid they are losing. If the temperature is hot and there is high humidity, then the person will sweat more and usually breathe a little more frequently. These factors decrease the amount of frequency of urination that would be considered normal. The amount you drink is easily determined, but the amount of fluid lost is sometimes more difficult to calculate.  Fluid is lost in two ways:  Sensible fluid loss is usually measured by the amount of urine that you get rid of. Losses of fluid can also occur with diarrhea.  Insensible  fluid loss is more difficult to measure. It is caused by evaporation. Insensible loss of fluid occurs through breathing and sweating. It usually ranges from a little less than a quart to a little more than a quart of fluid a day. In normal temperatures and activity levels, the average person may urinate 4 to 7 times in a 24-hour period. Needing to urinate more often than that could indicate a problem. If one urinates 4 to 7 times in 24 hours and has large volumes each time, that could indicate a different problem from one who urinates 4 to 7 times a day and has small volumes. The time of urinating is also important. Most urinating should be done during the waking hours. Getting up at night to urinate frequently can indicate some problems. CAUSES  The bladder is the organ in your lower abdomen that holds urine. Like a balloon, it swells some as it fills up. Your nerves sense this and tell you it is time to head for the bathroom. There are a number of reasons that you might feel the need to urinate more often than usual. They include:  Urinary tract infection. This is usually associated with other signs such as burning when you urinate.  In men, problems with the prostate (a walnut-size gland that is located near the tube that carries urine out of your body). There are two reasons why the prostate can cause an increased frequency of urination:  An enlarged prostate that does not let the bladder empty well. If the bladder only half  empties when you urinate, then it only has half the capacity to fill before you have to urinate again.  The nerves in the bladder become more hypersensitive with an increased size of the prostate even if the bladder empties completely.  Pregnancy.  Obesity. Excess weight is more likely to cause a problem for women than for men.  Bladder stones or other bladder problems.  Caffeine.  Alcohol.  Medications. For example, drugs that help the body get rid of extra fluid  (diuretics) increase urine production. Some other medicines must be taken with lots of fluids.  Muscle or nerve weakness. This might be the result of a spinal cord injury, a stroke, multiple sclerosis, or Parkinson disease.  Long-standing diabetes can decrease the sensation of the bladder. This loss of sensation makes it harder to sense the bladder needs to be emptied. Over a period of years, the bladder is stretched out by constant overfilling. This weakens the bladder muscles so that the bladder does not empty well and has less capacity to fill with new urine.  Interstitial cystitis (also called painful bladder syndrome). This condition develops because the tissues that line the inside of the bladder are inflamed (inflammation is the body's way of reacting to injury or infection). It causes pain and frequent urination. It occurs in women more often than in men. DIAGNOSIS   To decide what might be causing your urinary frequency, your health care provider will probably:  Ask about symptoms you have noticed.  Ask about your overall health. This will include questions about any medications you are taking.  Do a physical examination.  Order some tests. These might include:  A blood test to check for diabetes or other health issues that could be contributing to the problem.  Urine testing. This could measure the flow of urine and the pressure on the bladder.  A test of your neurological system (the brain, spinal cord, and nerves). This is the system that senses the need to urinate.  A bladder test to check whether it is emptying completely when you urinate.  Cystoscopy. This test uses a thin tube with a tiny camera on it. It offers a look inside your urethra and bladder to see if there are problems.  Imaging tests. You might be given a contrast dye and then asked to urinate. X-rays are taken to see how your bladder is working. TREATMENT  It is important for you to be evaluated to determine  if the amount or frequency that you have is unusual or abnormal. If it is found to be abnormal, the cause should be determined and this can usually be found out easily. Depending upon the cause, treatment could include medication, stimulation of the nerves, or surgery. There are not too many things that you can do as an individual to change your urinary frequency. It is important that you balance the amount of fluid intake needed to compensate for your activity and the temperature. Medical problems will be diagnosed and taken care of by your physician. There is no particular bladder training such as Kegel exercises that you can do to help urinary frequency. This is an exercise that is usually recommended for people who have leaking of urine when they laugh, cough, or sneeze. HOME CARE INSTRUCTIONS   Take any medications your health care provider prescribed or suggested. Follow the directions carefully.  Practice any lifestyle changes that are recommended. These might include:  Drinking less fluid or drinking at different times of the day. If  you need to urinate often during the night, for example, you may need to stop drinking fluids early in the evening.  Cutting down on caffeine or alcohol. They both can make you need to urinate more often than normal. Caffeine is found in coffee, tea, and sodas.  Losing weight, if that is recommended.  Keep a journal or a log. You might be asked to record how much you drink and when and where you feel the need to urinate. This will also help evaluate how well the treatment provided by your physician is working. SEEK MEDICAL CARE IF:   Your need to urinate often gets worse.  You feel increased pain or irritation when you urinate.  You notice blood in your urine.  You have questions about any medications that your health care provider recommended.  You notice blood, pus, or swelling at the site of any test or treatment procedure.  You develop a fever of  more than 100.55F (38.1C). SEEK IMMEDIATE MEDICAL CARE IF:  You develop a fever of more than 102.30F (38.9C). This information is not intended to replace advice given to you by your health care provider. Make sure you discuss any questions you have with your health care provider. Document Released: 10/28/2008 Document Revised: 01/22/2014 Document Reviewed: 07/28/2014 Elsevier Interactive Patient Education  2017 [employer]

## 2015-12-12 NOTE — MAU Note (Signed)
Pt presents to MAU with complaints of pressure in her vaginal area with urine frequency. Denies any vaginal bleeding or abnormal discharge.

## 2015-12-12 NOTE — Telephone Encounter (Signed)
Patient called for lab results- she still has pressure and wants US. (May leave message) 11:47 LM on VM- labs are great- everything is negative.Not sure why she is having the pressure- it may not be GYN related.

## 2015-12-20 ENCOUNTER — Ambulatory Visit (HOSPITAL_COMMUNITY)
Admission: RE | Admit: 2015-12-20 | Discharge: 2015-12-20 | Disposition: A | Discharge status: Home / Self Care | Payer: BLUE CROSS/BLUE SHIELD | Source: Ambulatory Visit | Attending: Advanced Practice Midwife | Admitting: Advanced Practice Midwife

## 2015-12-20 DIAGNOSIS — N946 Dysmenorrhea, unspecified: Secondary | ICD-10-CM | POA: Diagnosis not present

## 2015-12-20 DIAGNOSIS — R3989 Other symptoms and signs involving the genitourinary system: Secondary | ICD-10-CM

## 2015-12-22 ENCOUNTER — Encounter: Payer: Self-pay | Admitting: Advanced Practice Midwife

## 2015-12-22 DIAGNOSIS — R102 Pelvic and perineal pain unspecified side: Secondary | ICD-10-CM | POA: Insufficient documentation

## 2015-12-23 ENCOUNTER — Telehealth: Payer: Self-pay | Admitting: General Practice

## 2015-12-23 NOTE — Telephone Encounter (Signed)
Called patient regarding outpatient ultrasound results. Patient verbalized understanding & had no questions

## 2016-01-17 ENCOUNTER — Ambulatory Visit (INDEPENDENT_AMBULATORY_CARE_PROVIDER_SITE_OTHER): Payer: BLUE CROSS/BLUE SHIELD | Admitting: Family Medicine

## 2016-01-17 ENCOUNTER — Encounter: Payer: Self-pay | Admitting: Family Medicine

## 2016-01-17 VITALS — BP 120/64 | HR 63 | Temp 98.9°F | Resp 16 | Wt 109.0 lb

## 2016-01-17 DIAGNOSIS — J069 Acute upper respiratory infection, unspecified: Secondary | ICD-10-CM

## 2016-01-17 MED ORDER — CHLORPHEN-PE-ACETAMINOPHEN 4-10-325 MG PO TABS: 1.0000 | ORAL_TABLET | ORAL | 0 refills | Status: DC | PRN

## 2016-01-17 NOTE — Progress Notes (Signed)
Subjective: Chief Complaint  Patient presents with  . sick    sinus pressure, sinus headache,cough, chills but no fever     Christine Brooks is a 36 y.o. female who presents for possible sinus infection.  Symptoms include a 2 day history of sinus pressure and congestion, fatigue, cough that is occasionally productive.  Denies fever, ear pain, sore throat, chest pain, shortness of breath, wheezing, abdominal pain, N/V/D.   Past history is significant for no history of pneumonia or bronchitis. Patient is a smoker  (0.25 ppd x 19 yrs).  Using tylenol, robitussin for symptoms. positive sick contacts.  No other aggravating or relieving factors.  No other c/o.  ROS as in subjective   Objective: Vitals:   01/17/16 1142  BP: 120/64  Pulse: 63  Resp: 16  Temp: 98.9 F (37.2 C)    General appearance: Alert, WD/WN, no distress                             Skin: warm, no rash                           Head: no sinus tenderness,                            Eyes: conjunctiva normal, corneas clear, PERRLA                            Ears: pearly TMs, external ear canals normal                          Nose: septum midline, turbinates swollen, with erythema and no discharge             Mouth/throat: MMM, tongue normal, mild pharyngeal erythema                           Neck: supple, no adenopathy, no thyromegaly, nontender                          Heart: RRR, normal S1, S2, no murmurs                         Lungs: CTA bilaterally, no wheezes, rales, or rhonchi       Assessment and Plan: Acute URI  Discussed that symptoms suggest a viral illness and recommend supportive therapy. Samples of saline nasal spray and Norel-AD given #8. Can use OTC Mucinex for congestion.  Tylenol or Ibuprofen OTC for fever and malaise.  Discussed symptomatic relief, nasal saline flush, and call or return if worse or not improving in 2-3 days.

## 2016-01-17 NOTE — Patient Instructions (Signed)
Take the Norel-AD (1tab every 4-6 hours). Use the saline nasal spray.  Stay well hydrated.  You can use robitussin as needed for cough as well.

## 2016-01-25 ENCOUNTER — Encounter: Payer: Self-pay | Admitting: Obstetrics

## 2016-01-25 ENCOUNTER — Ambulatory Visit (INDEPENDENT_AMBULATORY_CARE_PROVIDER_SITE_OTHER): Payer: BLUE CROSS/BLUE SHIELD | Admitting: Obstetrics

## 2016-01-25 VITALS — BP 118/77 | HR 94 | Ht <= 58 in | Wt 101.0 lb

## 2016-01-25 DIAGNOSIS — Z113 Encounter for screening for infections with a predominantly sexual mode of transmission: Secondary | ICD-10-CM | POA: Diagnosis not present

## 2016-01-25 DIAGNOSIS — R634 Abnormal weight loss: Secondary | ICD-10-CM

## 2016-01-25 DIAGNOSIS — Z202 Contact with and (suspected) exposure to infections with a predominantly sexual mode of transmission: Secondary | ICD-10-CM

## 2016-01-25 DIAGNOSIS — R630 Anorexia: Secondary | ICD-10-CM

## 2016-01-25 DIAGNOSIS — F418 Other specified anxiety disorders: Secondary | ICD-10-CM

## 2016-01-25 NOTE — Progress Notes (Signed)
Pt presents for STD testing. Pt concerned she has HIV d/t weight loss and loss of appetite.

## 2016-01-25 NOTE — Progress Notes (Signed)
Patient ID: Christine Brooks, female   DOB: Feb 19, 1980, 36 y.o.   MRN: [id number]  Chief Complaint  Patient presents with  . Exposure to STD    HPI Christine Knackzia D Luft is a 36 y.o. female.  Unexplained weight loss and no appetite.  Concerned about STD exposure.  History of Trichomonas and yeast infection 2 months ago, treated. HPI  Past Medical History:  Diagnosis Date  . NWGNFAOZ(308.6Headache(784.0)     Past Surgical History:  Procedure Laterality Date  . CESAREAN SECTION    . DILATION AND CURETTAGE OF UTERUS      Family History  Problem Relation Age of Onset  . Ataxia Neg Hx   . Chorea Neg Hx   . Dementia Neg Hx   . Mental retardation Neg Hx   . Migraines Neg Hx   . Multiple sclerosis Neg Hx   . Neurofibromatosis Neg Hx   . Neuropathy Neg Hx   . Parkinsonism Neg Hx   . Seizures Neg Hx   . Stroke Neg Hx     Social History Social History  Substance Use Topics  . Smoking status: Current Some Day Smoker    Types: Cigarettes  . Smokeless tobacco: Never Used  . Alcohol use No     Comment: rare    No Known Allergies  Current Outpatient Prescriptions  Medication Sig Dispense Refill  . Prenatal Vit-Fe Fumarate-FA (MULTIVITAMIN-PRENATAL) 27-0.8 MG TABS tablet Take 1 tablet by mouth daily at 12 noon.    . Chlorphen-PE-Acetaminophen (NOREL AD) 4-10-325 MG TABS Take 1 tablet by mouth every 4 (four) hours as needed. (Patient not taking: Reported on 01/25/2016) 8 tablet 0  . gabapentin (NEURONTIN) 100 MG capsule Take 2 capsules at bedtime (Patient not taking: Reported on 01/25/2016) 60 capsule 11   No current facility-administered medications for this visit.     Review of Systems Review of Systems Constitutional: negative for fatigue, loss of appetite and weight loss Respiratory: negative for cough and wheezing Cardiovascular: negative for chest pain, fatigue and palpitations Gastrointestinal: negative for abdominal pain and change in bowel habits Genitourinary:negative Integument/breast:  negative for nipple discharge Musculoskeletal:negative for myalgias Neurological: negative for gait problems and tremors Behavioral/Psych: positive for anxiety over unexplained weight loss and possible STD, i.e. HIV Endocrine: negative for temperature intolerance      Blood pressure 118/77, pulse 94, height 4\' 9"  (1.448 m), weight 101 lb (45.8 kg), last menstrual period 12/31/2015.  Physical Exam Physical Exam General:   alert  Skin:   no rash or abnormalities  Lungs:   clear to auscultation bilaterally  Heart:   regular rate and rhythm, S1, S2 normal, no murmur, click, rub or gallop  Breasts:   normal without suspicious masses, skin or nipple changes or axillary nodes  Abdomen:  normal findings: no organomegaly, soft, non-tender and no hernia  Pelvis:  External genitalia: normal general appearance Urinary system: urethral meatus normal and bladder without fullness, nontender Vaginal: normal without tenderness, induration or masses Cervix: normal appearance Adnexa: normal bimanual exam Uterus: anteverted and non-tender, normal size    50% of 15 min visit spent on counseling and coordination of care.    Data Reviewed Labs  Assessment     Unexplained weight loss Poor appetite Anxiety H/O STD exposure. treated    Plan    Wet prep and cultures done Referred to Internal Medicine for further evaluation of weight loss and anxiety F/U prn  Orders Placed This Encounter  Procedures  . Hepatitis B surface antigen  .  Hepatitis C antibody  . HIV antibody  . RPR   Meds ordered this encounter  Medications  . Prenatal Vit-Fe Fumarate-FA (MULTIVITAMIN-PRENATAL) 27-0.8 MG TABS tablet    Sig: Take 1 tablet by mouth daily at 12 noon.

## 2016-01-26 ENCOUNTER — Telehealth: Payer: Self-pay | Admitting: *Deleted

## 2016-01-26 LAB — CERVICOVAGINAL ANCILLARY ONLY
BACTERIAL VAGINITIS: NEGATIVE
CANDIDA VAGINITIS: POSITIVE
Chlamydia: NEGATIVE
NEISSERIA GONORRHEA: NEGATIVE
Trichomonas: NEGATIVE

## 2016-01-26 LAB — HIV ANTIBODY (ROUTINE TESTING W REFLEX): HIV Screen 4th Generation wRfx: NONREACTIVE

## 2016-01-26 LAB — HEPATITIS B SURFACE ANTIGEN: Hepatitis B Surface Ag: NEGATIVE

## 2016-01-26 LAB — RPR: RPR: NONREACTIVE

## 2016-01-26 LAB — HEPATITIS C ANTIBODY: Hep C Virus Ab: <0.1 s/co ratio (ref 0.0–0.9)

## 2016-01-26 NOTE — Telephone Encounter (Signed)
Pt called to office for lab results.  Return call to pt. Pt made aware that all blood work is normal, Swab is still pending. Pt advised that she may call back to check for this result.

## 2016-02-17 ENCOUNTER — Ambulatory Visit: Payer: BLUE CROSS/BLUE SHIELD | Admitting: Neurology

## 2016-03-07 ENCOUNTER — Encounter: Payer: Self-pay | Admitting: Obstetrics

## 2016-03-07 ENCOUNTER — Ambulatory Visit (INDEPENDENT_AMBULATORY_CARE_PROVIDER_SITE_OTHER): Payer: BLUE CROSS/BLUE SHIELD | Admitting: Obstetrics

## 2016-03-07 VITALS — BP 120/81 | HR 98 | Wt 102.8 lb

## 2016-03-07 DIAGNOSIS — Z202 Contact with and (suspected) exposure to infections with a predominantly sexual mode of transmission: Secondary | ICD-10-CM

## 2016-03-07 DIAGNOSIS — Z113 Encounter for screening for infections with a predominantly sexual mode of transmission: Secondary | ICD-10-CM | POA: Diagnosis not present

## 2016-03-07 MED ORDER — TINIDAZOLE 500 MG PO TABS: 2.0000 g | ORAL_TABLET | Freq: Once | ORAL | 2 refills | Status: AC

## 2016-03-07 MED ORDER — TINIDAZOLE 500 MG PO TABS: 2.0000 g | ORAL_TABLET | Freq: Every day | ORAL | 2 refills | Status: DC

## 2016-03-07 NOTE — Progress Notes (Signed)
Patient request STD check- she thinks she may have been re-exposed. She took treatment a few days ago.

## 2016-03-07 NOTE — Progress Notes (Signed)
Patient ID: Christine Brooks, female   DOB: [name], 36 y.o.   MRN: [id number]  Chief Complaint  Patient presents with  . Gynecologic Exam    HPI Christine Brooks is a 36 y.o. female.  Possible re-exposure to Trichomonas. HPI  Past Medical History:  Diagnosis Date  . VFIEPPIR(518.8Headache(784.0)     Past Surgical History:  Procedure Laterality Date  . CESAREAN SECTION    . DILATION AND CURETTAGE OF UTERUS      Family History  Problem Relation Age of Onset  . Ataxia Neg Hx   . Chorea Neg Hx   . Dementia Neg Hx   . Mental retardation Neg Hx   . Migraines Neg Hx   . Multiple sclerosis Neg Hx   . Neurofibromatosis Neg Hx   . Neuropathy Neg Hx   . Parkinsonism Neg Hx   . Seizures Neg Hx   . Stroke Neg Hx     Social History Social History  Substance Use Topics  . Smoking status: Current Some Day Smoker    Types: Cigarettes  . Smokeless tobacco: Never Used  . Alcohol use No     Comment: rare    No Known Allergies  Current Outpatient Prescriptions  Medication Sig Dispense Refill  . Chlorphen-PE-Acetaminophen (NOREL AD) 4-10-325 MG TABS Take 1 tablet by mouth every 4 (four) hours as needed. (Patient not taking: Reported on 01/25/2016) 8 tablet 0  . gabapentin (NEURONTIN) 100 MG capsule Take 2 capsules at bedtime (Patient not taking: Reported on 01/25/2016) 60 capsule 11  . Prenatal Vit-Fe Fumarate-FA (MULTIVITAMIN-PRENATAL) 27-0.8 MG TABS tablet Take 1 tablet by mouth daily at 12 noon.    . tinidazole (TINDAMAX) 500 MG tablet Take 4 tablets (2,000 mg total) by mouth once. 4 tablet 2   No current facility-administered medications for this visit.     Review of Systems Review of Systems Constitutional: negative for fatigue and weight loss Respiratory: negative for cough and wheezing Cardiovascular: negative for chest pain, fatigue and palpitations Gastrointestinal: negative for abdominal pain and change in bowel habits Genitourinary:negative Integument/breast: negative for nipple  discharge Musculoskeletal:negative for myalgias Neurological: negative for gait problems and tremors Behavioral/Psych: negative for abusive relationship, depression Endocrine: negative for temperature intolerance      Blood pressure 120/81, pulse 98, weight 102 lb 12.8 oz (46.6 kg), last menstrual period 02/28/2016.  Physical Exam Physical Exam           General:  Alert and no distress Abdomen:  normal findings: no organomegaly, soft, non-tender and no hernia  Pelvis:  External genitalia: normal general appearance Urinary system: urethral meatus normal and bladder without fullness, nontender Vaginal: normal without tenderness, induration or masses Cervix: normal appearance Adnexa: normal bimanual exam Uterus: anteverted and non-tender, normal size    50% of 15 min visit spent on counseling and coordination of care.    Data Reviewed Wet Prep Cultures  Assessment     STD Screening    Plan    Wet Prep and Cultures done Tindamax Rx for presumptive re-exposure to known positive STD  F/U prn  No orders of the defined types were placed in this encounter.  Meds ordered this encounter  Medications  . DISCONTD: tinidazole (TINDAMAX) 500 MG tablet    Sig: Take 4 tablets (2,000 mg total) by mouth daily with breakfast.    Dispense:  4 tablet    Refill:  2  . tinidazole (TINDAMAX) 500 MG tablet    Sig: Take 4 tablets (2,000 mg  total) by mouth once.    Dispense:  4 tablet    Refill:  2         Patient ID: Christine Brooks, female   DOB: 1980/08/08, 36 y.o.   MRN: [id number]

## 2016-03-08 LAB — CERVICOVAGINAL ANCILLARY ONLY
Bacterial vaginitis: NEGATIVE
CHLAMYDIA, DNA PROBE: NEGATIVE
Candida vaginitis: NEGATIVE
NEISSERIA GONORRHEA: NEGATIVE
Trichomonas: POSITIVE — AB

## 2016-03-09 ENCOUNTER — Other Ambulatory Visit: Payer: Self-pay | Admitting: Obstetrics

## 2016-03-09 DIAGNOSIS — A5901 Trichomonal vulvovaginitis: Secondary | ICD-10-CM

## 2016-03-09 MED ORDER — TINIDAZOLE 500 MG PO TABS: 2.0000 g | ORAL_TABLET | Freq: Once | ORAL | 0 refills | Status: AC

## 2016-08-22 ENCOUNTER — Encounter (HOSPITAL_COMMUNITY): Payer: Self-pay

## 2016-08-22 ENCOUNTER — Emergency Department (HOSPITAL_COMMUNITY)
Admission: EM | Admit: 2016-08-22 | Discharge: 2016-08-22 | Disposition: A | Discharge status: Home / Self Care | Payer: BLUE CROSS/BLUE SHIELD | Attending: Emergency Medicine | Admitting: Emergency Medicine

## 2016-08-22 DIAGNOSIS — F17210 Nicotine dependence, cigarettes, uncomplicated: Secondary | ICD-10-CM | POA: Insufficient documentation

## 2016-08-22 DIAGNOSIS — R102 Pelvic and perineal pain: Secondary | ICD-10-CM

## 2016-08-22 NOTE — ED Provider Notes (Signed)
[facility]-EMERGENCY DEPT Provider Note   CSN: [id number] Arrival date & time: 08/22/16  0043     History   Chief Complaint Chief Complaint  Patient presents with  . Dysuria    HPI Christine Brooks is a 36 y.o. female.  HPI   36 year old female with hx of History of   pelvic pressure presenting complaining of same. Patient states for the past 8 months she has had persistent pressure in the pelvic region, felt as if her bladder is loose. She described her depression pain as a throbbing sensation, with recurrent increased urinary frequency without burning urination or hematuria vaginal bleeding or vaginal discharge. She has been seen several times in the past and check it for the UTI without positive results. She denies having associated fever, lightheadedness, dizziness, back pain. She has not noticed any abnormal mass, denies any abnormal weight changes. She would like to have a definitive diagnosis of her symptoms and decided to come to the ER for further evaluation. Currently her discomfort is mild and similar to prior. Her last menstrual period was 07/28.    Past Medical History:  Diagnosis Date  . WUJWJXBJ(478.2Headache(784.0)     Patient Active Problem List   Diagnosis Date Noted  . Pelvic pressure in female 12/22/2015  . Episodic tension-type headache, not intractable 08/16/2015  . Occipital neuralgia of left side 04/20/2014  . Headache(784.0) 02/25/2013    Past Surgical History:  Procedure Laterality Date  . CESAREAN SECTION    . DILATION AND CURETTAGE OF UTERUS      OB History    Gravida Para Term Preterm AB Living   3 2 2   1 2    SAB TAB Ectopic Multiple Live Births     1     2       Home Medications    Prior to Admission medications   Medication Sig Start Date End Date Taking? Authorizing Provider  ibuprofen (ADVIL,MOTRIN) 200 MG tablet Take 800 mg by mouth every 6 (six) hours as needed for moderate pain.   Yes [provider]  Chlorphen-PE-Acetaminophen (NOREL  AD) 4-10-325 MG TABS Take 1 tablet by mouth every 4 (four) hours as needed. Patient not taking: Reported on 01/25/2016 01/17/16   Avanell ShackletonHenson, Vickie L, NP-C  gabapentin (NEURONTIN) 100 MG capsule Take 2 capsules at bedtime Patient not taking: Reported on 01/25/2016 08/16/15   Van ClinesAquino, Karen M, MD    Family History Family History  Problem Relation Age of Onset  . Ataxia Neg Hx   . Chorea Neg Hx   . Dementia Neg Hx   . Mental retardation Neg Hx   . Migraines Neg Hx   . Multiple sclerosis Neg Hx   . Neurofibromatosis Neg Hx   . Neuropathy Neg Hx   . Parkinsonism Neg Hx   . Seizures Neg Hx   . Stroke Neg Hx     Social History Social History  Substance Use Topics  . Smoking status: Current Some Day Smoker    Types: Cigarettes  . Smokeless tobacco: Never Used  . Alcohol use No     Comment: rare     Allergies   Patient has no known allergies.   Review of Systems Review of Systems  All other systems reviewed and are negative.    Physical Exam Updated Vital Signs BP 140/88 (BP Location: Left Arm)   Pulse 86   Temp 98.3 F (36.8 C) (Oral)   Resp 18   LMP 08/11/2016 (Exact Date)  SpO2 100%   Physical Exam  Constitutional: She appears well-developed and well-nourished. No distress.  HENT:  Head: Atraumatic.  Eyes: Conjunctivae are normal.  Neck: Neck supple.  Cardiovascular: Normal rate and regular rhythm.   Pulmonary/Chest: Effort normal and breath sounds normal.  Abdominal: Soft. Bowel sounds are normal. She exhibits no distension. There is tenderness (Mild tenderness to suprapubic region on palpation no guarding rebound tenderness, no obvious mass.).  Neurological: She is alert.  Skin: No rash noted.  Psychiatric: She has a normal mood and affect.  Nursing note and vitals reviewed.    ED Treatments / Results  Labs (all labs ordered are listed, but only abnormal results are displayed) Labs Reviewed  URINALYSIS, ROUTINE W REFLEX MICROSCOPIC  POC URINE PREG, ED     EKG  EKG Interpretation None       Radiology No results found.  Procedures Procedures (including critical care time)  Medications Ordered in ED Medications - No data to display   Initial Impression / Assessment and Plan / ED Course  I have reviewed the triage vital signs and the nursing notes.  Pertinent labs & imaging results that were available during my care of the patient were reviewed by me and considered in my medical decision making (see chart for details).     BP 140/88 (BP Location: Left Arm)   Pulse 86   Temp 98.3 F (36.8 C) (Oral)   Resp 18   LMP 08/11/2016 (Exact Date)   SpO2 100%    Final Clinical Impressions(s) / ED Diagnoses   Final diagnoses:  Pelvic pain in female    New Prescriptions New Prescriptions   No medications on file   6:28 AM Patient here with persistent low pelvic pain and pressure which appears to be chronic in nature as is been at least 8 months. Patient states she has been evaluated several times in this and was never been diagnosed with a UTI. No new changes in her symptoms. At this time, I felt the symptoms of pelvic floor dysfunction will be best addressed by primary care provider on with an OB/GYN. Patient states that her primary care provider is an OB/GYN. Low suspicion of UTI, pyelo, STI, or other acute pathology. She agrees to follow-up for further evaluation. We discussed options of pelvic examination and check UA however she agrees that this symptom is chronic and does not think that she truly have a urinary tract infection. No further testing was performed today and patient is in agreement. She understands to return if her symptoms worsen.    Fayrene Helper, PA-C 08/22/16 7425    Gilda Crease, MD 08/22/16 0730

## 2016-08-22 NOTE — ED Notes (Addendum)
Pt. Refused for bladder scan and urine preg test. Pt. Refused to receive Discharge papers, pt. Stated that she waited longer to be seen and now wanted to leave.

## 2016-08-22 NOTE — ED Triage Notes (Addendum)
Pt states that she is experiencing painful urination and feeling like she is not emptying her bladder x2 weeks. She denies vaginal discharge. She states that this feeling usually comes and goes with her menstrual cycle. She states that she has been checked for UTIs and seen her gynecologist with no answers. A&Ox4. Ambualtory.

## 2016-08-22 NOTE — Discharge Instructions (Signed)
Please follow up with your primary care provider for further evaluation of your recurrent pelvic pain.  You may need to be evaluated by an OBGYN for further care.  Return if you have any concerns.

## 2016-09-07 ENCOUNTER — Emergency Department (HOSPITAL_COMMUNITY)
Admission: EM | Admit: 2016-09-07 | Discharge: 2016-09-07 | Discharge status: Against Medical Advice | Payer: No Typology Code available for payment source | Attending: Emergency Medicine | Admitting: Emergency Medicine

## 2016-09-07 ENCOUNTER — Encounter (HOSPITAL_COMMUNITY): Payer: Self-pay | Admitting: Emergency Medicine

## 2016-09-07 ENCOUNTER — Emergency Department (HOSPITAL_COMMUNITY)
Admission: EM | Admit: 2016-09-07 | Discharge: 2016-09-07 | Disposition: A | Discharge status: Home / Self Care | Payer: No Typology Code available for payment source | Attending: Emergency Medicine | Admitting: Emergency Medicine

## 2016-09-07 ENCOUNTER — Emergency Department (HOSPITAL_COMMUNITY): Payer: No Typology Code available for payment source

## 2016-09-07 DIAGNOSIS — Z041 Encounter for examination and observation following transport accident: Secondary | ICD-10-CM | POA: Diagnosis present

## 2016-09-07 DIAGNOSIS — Z5321 Procedure and treatment not carried out due to patient leaving prior to being seen by health care provider: Secondary | ICD-10-CM | POA: Diagnosis not present

## 2016-09-07 DIAGNOSIS — M549 Dorsalgia, unspecified: Secondary | ICD-10-CM | POA: Diagnosis not present

## 2016-09-07 DIAGNOSIS — F17210 Nicotine dependence, cigarettes, uncomplicated: Secondary | ICD-10-CM | POA: Insufficient documentation

## 2016-09-07 LAB — POC URINE PREG, ED: PREG TEST UR: NEGATIVE

## 2016-09-07 MED ORDER — NAPROXEN 500 MG PO TABS: 500.0000 mg | ORAL_TABLET | Freq: Two times a day (BID) | ORAL | 0 refills | Status: DC

## 2016-09-07 MED ORDER — METHOCARBAMOL 500 MG PO TABS: 500.0000 mg | ORAL_TABLET | Freq: Two times a day (BID) | ORAL | 0 refills | Status: DC
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
## 2016-09-07 NOTE — ED Notes (Signed)
No answer when called 
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
## 2016-09-07 NOTE — Discharge Instructions (Signed)
Take the prescribed medication as directed. Can also use heat therapy to help with muscle soreness. Follow-up with your primary care doctor if any ongoing issues. Return to the ED for new or worsening symptoms.

## 2016-11-01 ENCOUNTER — Inpatient Hospital Stay
Admit: 2016-11-01 | Discharge: 2016-11-01 | Disposition: A | Discharge status: Home / Self Care | Payer: MEDICAID | Attending: Emergency Medicine

## 2016-11-01 DIAGNOSIS — H65192 Other acute nonsuppurative otitis media, left ear: Secondary | ICD-10-CM

## 2016-11-01 MED ORDER — AMOXICILLIN 500 MG TABLET
500 mg | ORAL_TABLET | Freq: Three times a day (TID) | ORAL | 0 refills | Status: AC
Start: 2016-11-01 — End: 2016-11-11

## 2016-11-01 MED ORDER — OXYMETAZOLINE 0.05 % NASAL SPRAY AEROSOL
0.05 % | Freq: Two times a day (BID) | NASAL | 0 refills | Status: AC
Start: 2016-11-01 — End: 2016-11-04

## 2016-11-01 NOTE — ED Notes (Signed)
Pt discharged per MD order. Pt verbalized understanding of discharge instructions and follow up care. Prescription education given. Pt ambulated independently out of ED.

## 2016-11-01 NOTE — ED Triage Notes (Signed)
Patient states that hearing became muffled about a week ago. Patient was self treating with ear drop but doe not remember the   Name.

## 2016-11-01 NOTE — ED Provider Notes (Signed)
EMERGENCY DEPARTMENT HISTORY AND PHYSICAL EXAM    10:54 AM      Date: 11/01/2016  Patient Name: Cassandra Mathis    History of Presenting Illness     Chief Complaint   Patient presents with   ??? Ear Pain       History Provided By: Patient    Chief Complaint: left ear pain  Duration:  Weeks  Timing:  Acute  Location: left  Quality: Aching  Severity: Moderate  Modifying Factors: none  Associated Symptoms: denies any other associated signs or symptoms      Additional History (Context):Cassandra Mathis is a 36 y.o. female who presents to the emergency department for evaluation of left ear pain x 2 weeks.  She relates some upper respiratory symptoms with nasal congestion.  No otc treatments.  No coughing or sore throat.  No ill contacts.  No history of similar complaints.  Pt denies any fevers or chills, headache, dizziness or light headedness, CP or discomfort, SOB, cough, n/v/d/c, abd pain, back pain, diaphoresis, melena/hematochezia, dysuria, hematuria, frequency, focal weakness/numbness/tingling, or rash.  Patient has no other complaints at this time.    PCP:  None    Current Outpatient Medications   Medication Sig Dispense Refill   ??? amoxicillin 500 mg tab Take 500 mg by mouth three (3) times daily for 10 days. 30 Tab 0   ??? oxymetazoline (AFRIN, OXYMETAZOLINE,) 0.05 % nasal spray 2 Sprays by Both Nostrils route two (2) times a day for 3 days. 1 Each 0       Past History     Past Medical History:  No past medical history on file.    Past Surgical History:  No past surgical history on file.    Family History:  No family history on file.    Social History:  Social History     Tobacco Use   ??? Smoking status: Not on file   Substance Use Topics   ??? Alcohol use: Not on file   ??? Drug use: Not on file       Allergies:  Not on File      Review of Systems       Review of Systems   Constitutional: Negative for chills and fever.   HENT: Positive for ear pain. Negative for congestion, rhinorrhea and sore throat.     Respiratory: Negative for cough and shortness of breath.    Cardiovascular: Negative for chest pain.   Gastrointestinal: Negative for abdominal pain, blood in stool, constipation, diarrhea, nausea and vomiting.   Genitourinary: Negative for dysuria, frequency and hematuria.   Musculoskeletal: Negative for back pain and myalgias.   Skin: Negative for rash and wound.   Neurological: Negative for dizziness and headaches. Light-headedness:        Physical Exam     Visit Vitals  BP 113/69 (BP 1 Location: Left arm)   Pulse 93   Temp 97.2 ??F (36.2 ??C)   Resp 20   Ht 5' (1.524 m)   Wt 49 kg (108 lb)   BMI 21.09 kg/m??     Physical Exam   Constitutional: She is oriented to person, place, and time. She appears well-developed and well-nourished. No distress.   HENT:   Head: Normocephalic and atraumatic.   Nose: Nose normal.   Mouth/Throat: Oropharynx is clear and moist. No oropharyngeal exudate.   Left TM with erythema and fluid.  Slightly bulging.  Bilateral nasal turbinate erythema and edema, L>R   Eyes: Conjunctivae are normal.  Neck: Normal range of motion. Neck supple. No thyromegaly present.   Cardiovascular: Normal rate and regular rhythm. Exam reveals no gallop and no friction rub.   No murmur heard.  Pulmonary/Chest: Effort normal and breath sounds normal. No respiratory distress. She has no wheezes. She has no rales. She exhibits no tenderness.   Abdominal: Soft. Bowel sounds are normal. She exhibits no distension. There is no tenderness. There is no rebound and no guarding.   Musculoskeletal: She exhibits no edema or deformity.   Lymphadenopathy:     She has no cervical adenopathy.   Neurological: She is alert and oriented to person, place, and time. She has normal reflexes.   Skin: Skin is warm and dry. She is not diaphoretic.   Psychiatric: She has a normal mood and affect.   Nursing note and vitals reviewed.      Diagnostic Study Results     Labs -   No results found for this or any previous visit (from the past 12 hour(s)).    Radiologic Studies -   No results found.      Medical Decision Making   I am the first provider for this patient.    I reviewed the vital signs, available nursing notes, past medical history, past surgical history, family history and social history.    Vital Signs-Reviewed the patient's vital signs.    Pulse Oximetry Analysis -  100% on room air (Interpretation)    Records Reviewed: Nursing Notes and Old Medical Records (Time of Review: 10:54 AM)    ED Course: Progress Notes, Reevaluation, and Consults:    Provider Notes (Medical Decision Making):   Differential Diagnosis: Otitis media, serous otitis media, barotitis media, otitis externa, mastoiditis, barosinusitis, ear FB, furuncle, chondroma, bullous myringitis, TM perforation, tinnitus, lateral sinus thrombosis, pharyngitis, cellulitis, abscess    Plan:  Pt presents in NAD, vitals wnl.  Exam and HPI c/w AOM.  Will treat.  At this time, patient is stable and appropriate for discharge home.  Patient demonstrates understanding of current diagnoses and is in agreement with the treatment plan.  They are advised that while the likelihood of serious underlying condition is low at this point given the evaluation performed today, we cannot fully rule it out.  They are advised to immediately return with any new symptoms or worsening of current condition.  All questions have been answered.  Patient is given educational material regarding their diagnoses, including danger symptoms and when to return to the ED.          Diagnosis     Clinical Impression:   1. Other acute nonsuppurative otitis media of left ear, recurrence not specified      Disposition: DC Home    Follow-up Information     Follow up With Specialties Details Why Contact Info     [facility]  Call in 2 days For follow-up [address].  [place] [address]  [phone]     [facility] EMERGENCY DEPT Emergency Medicine Go to As needed, If symptoms worsen [address] [facility] [place] [address]  [phone]              Medication List      START taking these medications    amoxicillin 500 mg Tab  Take 500 mg by mouth three (3) times daily for 10 days.     oxymetazoline 0.05 % nasal spray  Commonly known as:  AFRIN (OXYMETAZOLINE)  2 Sprays by Both Nostrils route two (  2) times a day for 3 days.           Where to Get Your Medications      Information about where to get these medications is not yet available    Ask your nurse or doctor about these medications  ?? amoxicillin 500 mg Tab  ?? oxymetazoline 0.05 % nasal spray       _______________________________

## 2017-02-15 DIAGNOSIS — Z8719 Personal history of other diseases of the digestive system: Secondary | ICD-10-CM

## 2017-02-15 HISTORY — DX: Personal history of other diseases of the digestive system: Z87.19

## 2017-05-28 IMAGING — US US TRANSVAGINAL NON-OB
1 series · 15 of 25 positions shown · non-contrast
Comparison: None

CLINICAL DATA: Dysmenorrhea for 1 year

EXAM:
TRANSABDOMINAL AND TRANSVAGINAL ULTRASOUND OF PELVIS
TECHNIQUE: Both transabdominal and transvaginal ultrasound examinations of the
pelvis were performed. Transabdominal technique was performed for
global imaging of the pelvis including uterus, ovaries, adnexal
regions, and pelvic cul-de-sac. It was necessary to proceed with
endovaginal exam following the transabdominal exam to visualize the
endometrium and ovaries.

[Series 1: us transvaginal non-ob · 15 of 97 slices shown]
[im 1/97]
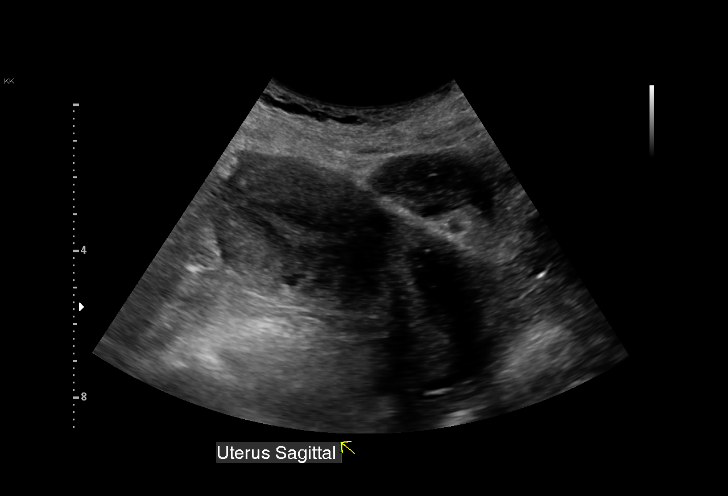
[im 9/97]
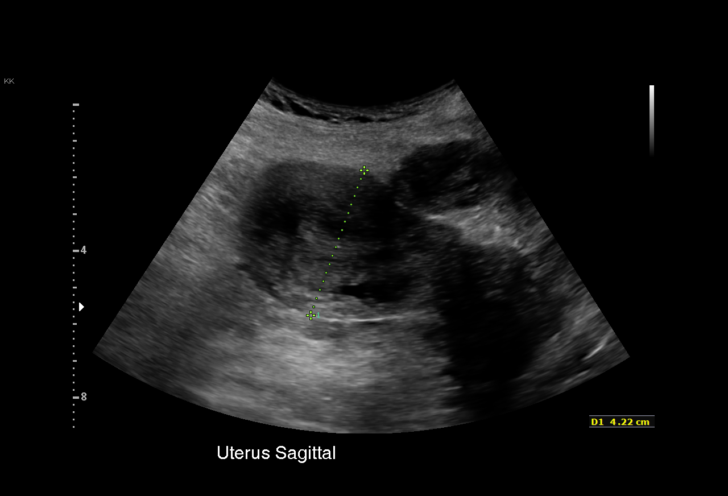
[im 17/97]
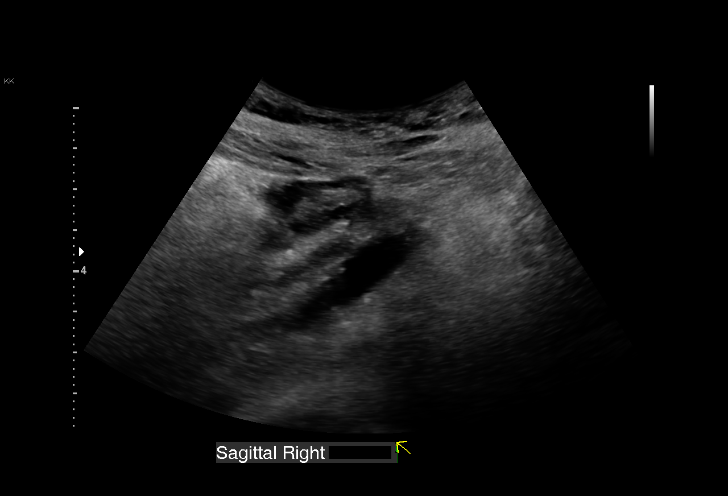
[im 21/97]
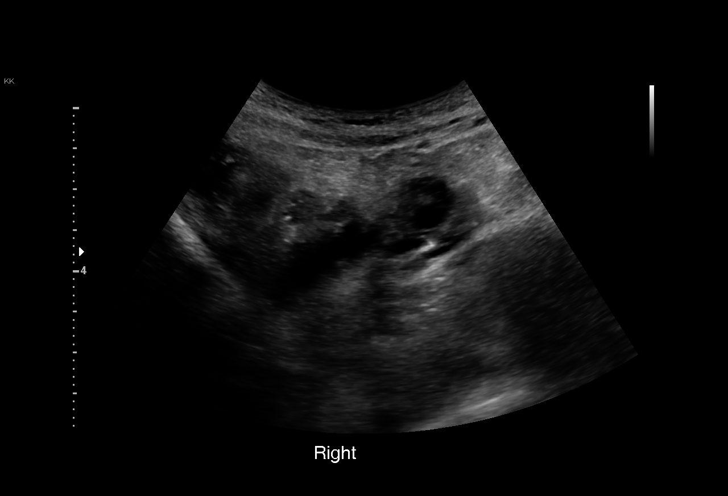
[im 29/97]
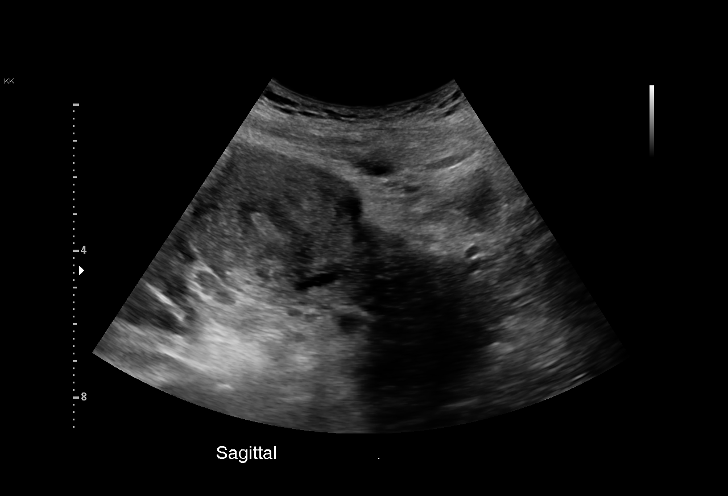
[im 37/97]
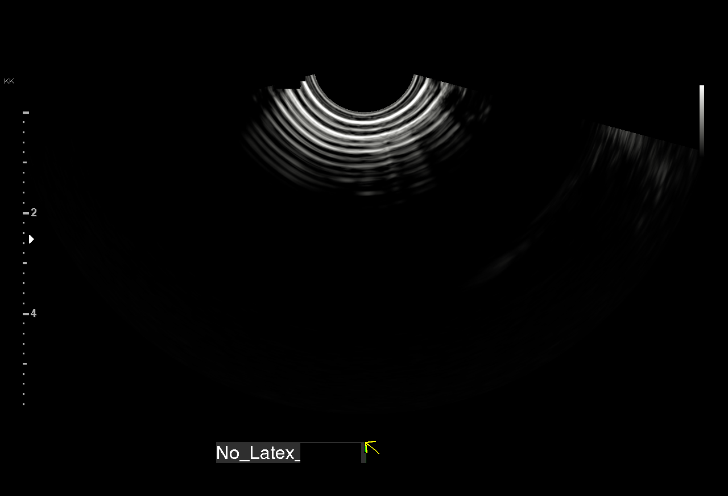
[im 41/97]
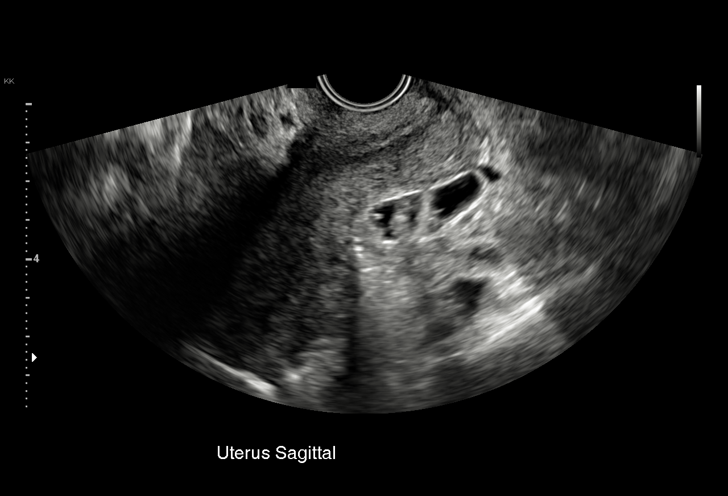
[im 49/97]
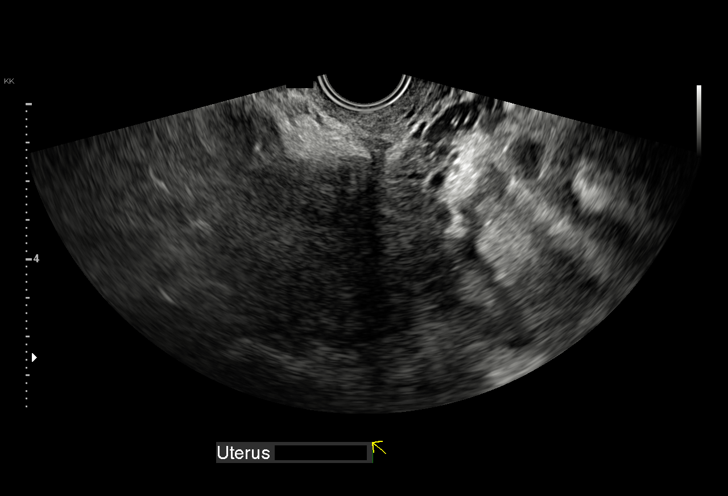
[im 57/97]
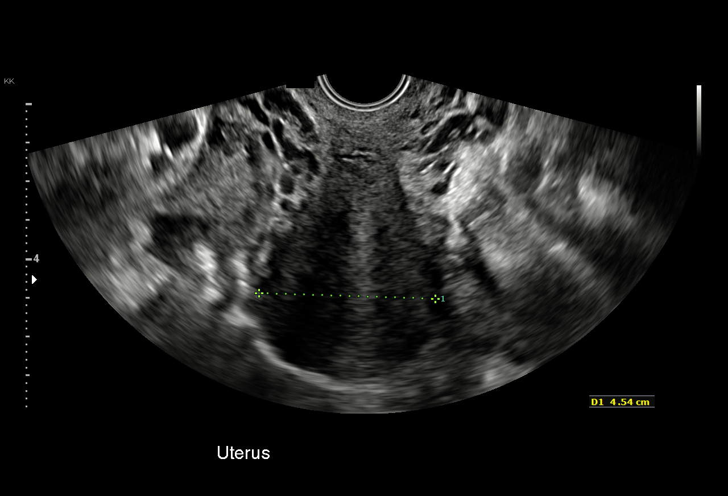
[im 61/97]
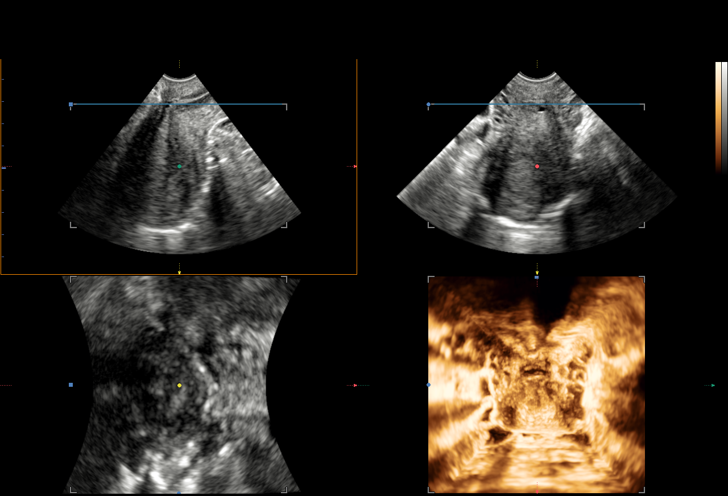
[im 69/97]
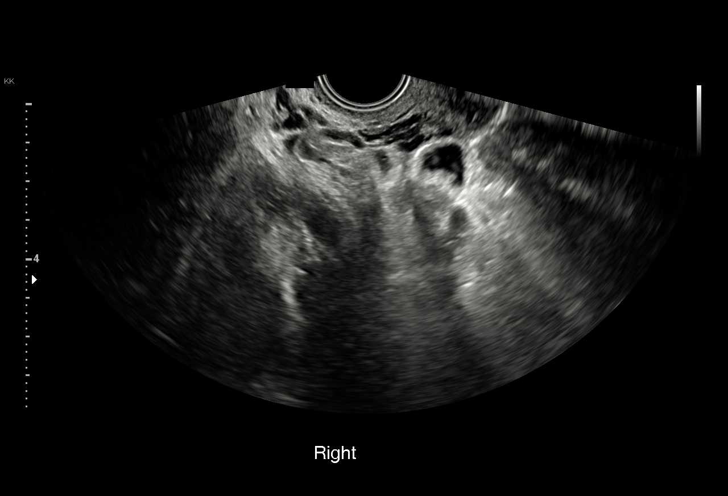
[im 77/97]
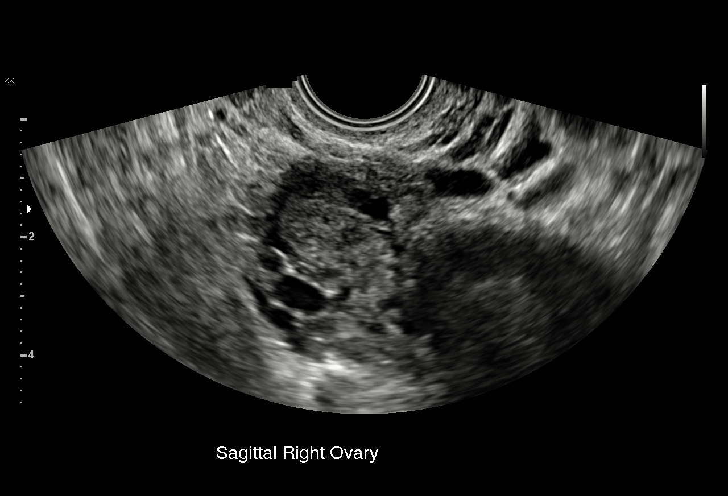
[im 81/97]
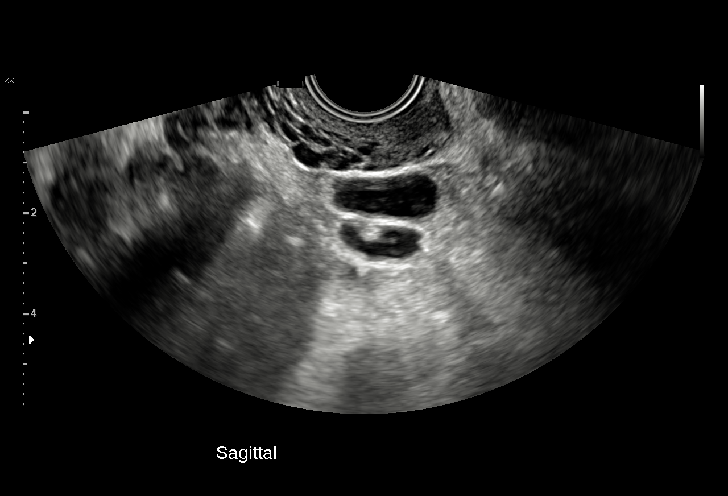
[im 89/97]
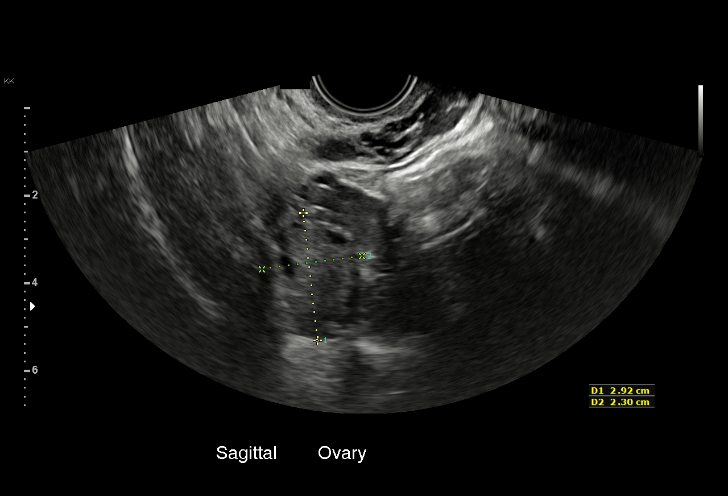
[im 97/97]
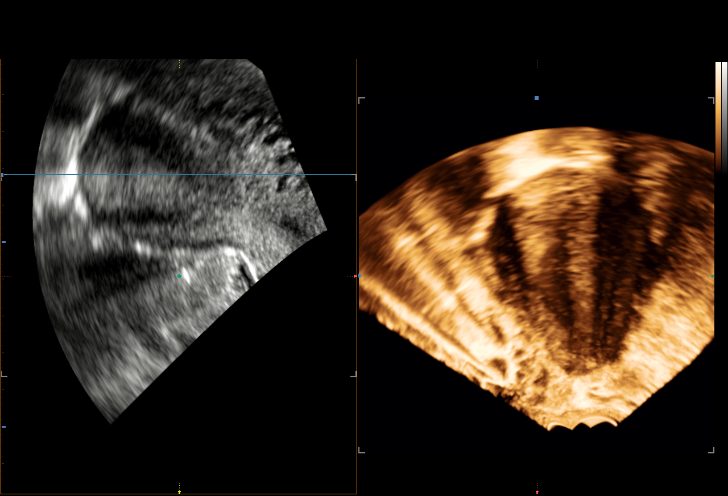

[15 of 25 positions shown; findings below may reference images not displayed]

FINDINGS: Uterus

Measurements: 9.6 x 4.2 x 4.5 cm. No fibroids or other mass
visualized.

Endometrium

Thickness: 13 mm in thickness.  No focal abnormality visualized.

Right ovary

Measurements: 2.8 x 2.1 x 2.2 cm. Normal appearance/no adnexal mass.

Left ovary

Measurements: 2.9 x 2.3 x 2.4 cm. Normal appearance/no adnexal mass.

Other findings

Small amount of free fluid in the pelvis
IMPRESSION: Unremarkable pelvic ultrasound.

## 2017-11-15 ENCOUNTER — Encounter (HOSPITAL_COMMUNITY): Payer: Self-pay | Admitting: *Deleted

## 2017-11-15 ENCOUNTER — Other Ambulatory Visit: Payer: Self-pay

## 2017-11-15 ENCOUNTER — Inpatient Hospital Stay (HOSPITAL_COMMUNITY)
Admission: AD | Admit: 2017-11-15 | Discharge: 2017-11-15 | Disposition: A | Discharge status: Home / Self Care | Payer: Medicaid - Out of State | Source: Ambulatory Visit | Attending: Obstetrics & Gynecology | Admitting: Obstetrics & Gynecology

## 2017-11-15 DIAGNOSIS — N644 Mastodynia: Secondary | ICD-10-CM

## 2017-11-15 DIAGNOSIS — Z79899 Other long term (current) drug therapy: Secondary | ICD-10-CM | POA: Diagnosis not present

## 2017-11-15 DIAGNOSIS — F17210 Nicotine dependence, cigarettes, uncomplicated: Secondary | ICD-10-CM | POA: Diagnosis not present

## 2017-11-15 HISTORY — DX: Unspecified intestinal obstruction, unspecified as to partial versus complete obstruction: K56.609

## 2017-11-15 NOTE — Discharge Instructions (Signed)

## 2017-11-15 NOTE — MAU Provider Note (Signed)
History     CSN: [id number]  Arrival date and time: 11/15/17 1543   First Provider Initiated Contact with Patient 11/15/17 1630      Chief Complaint  Patient presents with  . Breast Pain   HPI Christine Brooks is a 37 y.o. [id number] non pregnant female who presents with left breast pain. She states the pain started a few weeks ago and is worsening. She states the pain is primarily under her arm pit and she feels like there is a lump. She denies any other gyn complaints.   OB History    Gravida  3   Para  2   Term  2   Preterm      AB  1   Living  2     SAB      TAB  1   Ectopic      Multiple      Live Births  2           Past Medical History:  Diagnosis Date  . Bowel obstruction (HCC)   . JYNWGNFA(213.0)     Past Surgical History:  Procedure Laterality Date  . CESAREAN SECTION    . DILATION AND CURETTAGE OF UTERUS    . LAPAROSCOPY    . PARTIAL HYSTERECTOMY      Family History  Problem Relation Age of Onset  . Healthy Mother   . Ataxia Neg Hx   . Chorea Neg Hx   . Dementia Neg Hx   . Mental retardation Neg Hx   . Migraines Neg Hx   . Multiple sclerosis Neg Hx   . Neurofibromatosis Neg Hx   . Neuropathy Neg Hx   . Parkinsonism Neg Hx   . Seizures Neg Hx   . Stroke Neg Hx     Social History   Tobacco Use  . Smoking status: Current Some Day Smoker    Types: Cigarettes  . Smokeless tobacco: Never Used  Substance Use Topics  . Alcohol use: Not Currently    Alcohol/week: 0.0 standard drinks    Comment: rare  . Drug use: No    Allergies: No Known Allergies  Medications Prior to Admission  Medication Sig Dispense Refill Last Dose  . Chlorphen-PE-Acetaminophen (NOREL AD) 4-10-325 MG TABS Take 1 tablet by mouth every 4 (four) hours as needed. (Patient not taking: Reported on 01/25/2016) 8 tablet 0 Not Taking at Unknown time  . gabapentin (NEURONTIN) 100 MG capsule Take 2 capsules at bedtime (Patient not taking: Reported on 01/25/2016) 60  capsule 11 Not Taking  . ibuprofen (ADVIL,MOTRIN) 200 MG tablet Take 800 mg by mouth every 6 (six) hours as needed for moderate pain.   Past Week at Unknown time  . methocarbamol (ROBAXIN) 500 MG tablet Take 1 tablet (500 mg total) by mouth 2 (two) times daily. 20 tablet 0   . naproxen (NAPROSYN) 500 MG tablet Take 1 tablet (500 mg total) by mouth 2 (two) times daily with a meal. 30 tablet 0     Review of Systems  Respiratory: Negative for chest tightness and shortness of breath.   Gastrointestinal: Negative for nausea and vomiting.  Genitourinary: Negative for vaginal bleeding and vaginal discharge.  Musculoskeletal:       Breast pain    Physical Exam   Blood pressure 128/85, pulse 85, temperature 98.7 F (37.1 C), temperature source Oral, resp. rate 18, weight 53.8 kg, last menstrual period 08/11/2016, SpO2 100 %.  Physical Exam  Nursing note and vitals  reviewed. Constitutional: She is oriented to person, place, and time. She appears well-developed and well-nourished. No distress.  HENT:  Head: Normocephalic.  Eyes: Pupils are equal, round, and reactive to light.  Cardiovascular: Normal rate, regular rhythm and normal heart sounds.  Respiratory: Effort normal and breath sounds normal. No respiratory distress. Left breast exhibits tenderness. Left breast exhibits no mass and no skin change.    GI: Soft. Bowel sounds are normal. She exhibits no distension. There is no tenderness.  Neurological: She is alert and oriented to person, place, and time.  Skin: Skin is warm and dry.  Psychiatric: She has a normal mood and affect. Her behavior is normal. Judgment and thought content normal.    MAU Course  Procedures  MDM Breast exam with chaperone  Assessment and Plan   1. Breast pain, left    -Discharge home in stable condition -Order for mammogram and ultrasound placed -Patient advised to follow-up with breast center next week, will call patient -Patient may return to MAU as  needed or if her condition were to change or worsen  Rolm Bookbinder CNM 11/15/2017, 4:30 PM

## 2017-11-15 NOTE — Progress Notes (Signed)
Order for breast ultrasound and mammogram placed after MAU visit.   Rolm Bookbinder, CNM 11/15/17 5:12 PM

## 2017-11-15 NOTE — MAU Note (Signed)
Last week was having some really bad pains in left breast.  Felt heavy.  Feels like there is a knot on the side, pain into armpit.  Breast feels swollen. No redness, no d/c or bleeding from nipple.

## 2017-11-20 ENCOUNTER — Ambulatory Visit
Admission: RE | Admit: 2017-11-20 | Discharge: 2017-11-20 | Disposition: A | Discharge status: Home / Self Care | Payer: Medicaid - Out of State | Source: Ambulatory Visit

## 2017-11-20 DIAGNOSIS — N644 Mastodynia: Secondary | ICD-10-CM

## 2018-04-12 ENCOUNTER — Other Ambulatory Visit: Payer: Self-pay

## 2018-04-12 ENCOUNTER — Encounter (HOSPITAL_COMMUNITY): Payer: Self-pay

## 2018-04-12 ENCOUNTER — Ambulatory Visit (HOSPITAL_COMMUNITY)
Admission: EM | Admit: 2018-04-12 | Discharge: 2018-04-12 | Disposition: A | Discharge status: Home / Self Care | Payer: Medicaid - Out of State | Attending: Family Medicine | Admitting: Family Medicine

## 2018-04-12 DIAGNOSIS — B349 Viral infection, unspecified: Secondary | ICD-10-CM

## 2018-04-12 DIAGNOSIS — R5383 Other fatigue: Secondary | ICD-10-CM

## 2018-04-12 NOTE — ED Triage Notes (Signed)
Pt cc fatigued, chills and cough x 4 days pt tried some nyquil  night last.

## 2018-04-16 NOTE — ED Provider Notes (Signed)
[facility]   [phone] 04/12/18 Arrival Time: 1410  ASSESSMENT & PLAN:  1. Fatigue, unspecified type   2. Viral illness    Suspect viral etiology. Discussed typical duration of symptoms. OTC symptom care as needed. Ensure adequate fluid intake and rest. May f/u with PCP or here as needed.  Reviewed expectations re: course of current medical issues. Questions answered. Outlined signs and symptoms indicating need for more acute intervention. Patient verbalized understanding. After Visit Summary given.   SUBJECTIVE: History from: patient.  Christine Brooks is a 37 y.o. female who presents with complaint of mild nasal congestion and a dry cough; without sore throat. Onset abrupt, about 4 days ago; with fatigue and without body aches. SOB: none. Wheezing: none. Fever: does not think so; occasional chills. Overall normal PO intake without n/v. Known sick contacts: no. No specific or significant aggravating or alleviating factors reported. OTC treatment: NyQuil helps her sleep. Feeling of fatigue is bothering her the most.  Social History   Tobacco Use  Smoking Status Current Some Day Smoker  . Types: Cigarettes  Smokeless Tobacco Never Used    ROS: As per HPI.   OBJECTIVE:  Vitals:   04/12/18 1436 04/12/18 1438  BP:  124/88  Pulse:  74  Resp:  18  Temp:  98.6 F (37 C)  TempSrc:  Oral  SpO2:  100%  Weight: 59 kg      General appearance: alert; appears fatigued HEENT: nasal congestion; clear runny nose; throat irritation secondary to post-nasal drainage Neck: supple without LAD CV: RRR Lungs: unlabored respirations, symmetrical air entry without wheezing; cough: mild and dry Abd: soft Ext: no LE edema Skin: warm and dry Psychological: alert and cooperative; normal mood and affect   No Known Allergies  Past Medical History:  Diagnosis Date  . Bowel obstruction (HCC)   . Headache(784.0)    Family History  Problem Relation Age of Onset  .  Healthy Mother   . Ataxia Neg Hx   . Chorea Neg Hx   . Dementia Neg Hx   . Mental retardation Neg Hx   . Migraines Neg Hx   . Multiple sclerosis Neg Hx   . Neurofibromatosis Neg Hx   . Neuropathy Neg Hx   . Parkinsonism Neg Hx   . Seizures Neg Hx   . Stroke Neg Hx    Social History   Socioeconomic History  . Marital status: Single    Spouse name: Not on file  . Number of children: Not on file  . Years of education: Not on file  . Highest education level: Not on file  Occupational History  . Not on file  Social Needs  . Financial resource strain: Not on file  . Food insecurity:    Worry: Not on file    Inability: Not on file  . Transportation needs:    Medical: Not on file    Non-medical: Not on file  Tobacco Use  . Smoking status: Current Some Day Smoker    Types: Cigarettes  . Smokeless tobacco: Never Used  Substance and Sexual Activity  . Alcohol use: Not Currently    Alcohol/week: 0.0 standard drinks    Comment: rare  . Drug use: No  . Sexual activity: Yes    Partners: Male  Lifestyle  . Physical activity:    Days per week: Not on file    Minutes per session: Not on file  . Stress: Not on file  Relationships  . Social connections:  Talks on phone: Not on file    Gets together: Not on file    Attends religious service: Not on file    Active member of club or organization: Not on file    Attends meetings of clubs or organizations: Not on file    Relationship status: Not on file  . Intimate partner violence:    Fear of current or ex partner: Not on file    Emotionally abused: Not on file    Physically abused: Not on file    Forced sexual activity: Not on file  Other Topics Concern  . Not on file  Social History Narrative  . Not on file           Mardella Layman, MD 04/16/18 1134

## 2018-05-06 ENCOUNTER — Encounter (HOSPITAL_COMMUNITY): Payer: Self-pay

## 2018-05-06 ENCOUNTER — Other Ambulatory Visit: Payer: Self-pay

## 2018-05-06 ENCOUNTER — Emergency Department (HOSPITAL_COMMUNITY): Payer: Medicaid Other

## 2018-05-06 ENCOUNTER — Emergency Department (HOSPITAL_COMMUNITY)
Admission: EM | Admit: 2018-05-06 | Discharge: 2018-05-06 | Disposition: A | Discharge status: Home / Self Care | Payer: Medicaid Other | Attending: Emergency Medicine | Admitting: Emergency Medicine

## 2018-05-06 DIAGNOSIS — N939 Abnormal uterine and vaginal bleeding, unspecified: Secondary | ICD-10-CM | POA: Diagnosis present

## 2018-05-06 DIAGNOSIS — F17210 Nicotine dependence, cigarettes, uncomplicated: Secondary | ICD-10-CM | POA: Insufficient documentation

## 2018-05-06 DIAGNOSIS — Z79899 Other long term (current) drug therapy: Secondary | ICD-10-CM | POA: Diagnosis not present

## 2018-05-06 DIAGNOSIS — N760 Acute vaginitis: Secondary | ICD-10-CM | POA: Insufficient documentation

## 2018-05-06 LAB — CBC WITH DIFFERENTIAL/PLATELET
Abs Immature Granulocytes: 0.01 10*3/uL (ref 0.00–0.07)
Basophils Absolute: 0 10*3/uL (ref 0.0–0.1)
Basophils Relative: 0 %
Eosinophils Absolute: 0.1 10*3/uL (ref 0.0–0.5)
Eosinophils Relative: 2 %
HCT: 34.3 % — ABNORMAL LOW (ref 36.0–46.0)
Hemoglobin: 11.8 g/dL — ABNORMAL LOW (ref 12.0–15.0)
Immature Granulocytes: 0 %
Lymphocytes Relative: 44 %
Lymphs Abs: 2.6 10*3/uL (ref 0.7–4.0)
MCH: 27.8 pg (ref 26.0–34.0)
MCHC: 34.4 g/dL (ref 30.0–36.0)
MCV: 80.9 fL (ref 80.0–100.0)
Monocytes Absolute: 0.5 10*3/uL (ref 0.1–1.0)
Monocytes Relative: 9 %
Neutro Abs: 2.7 10*3/uL (ref 1.7–7.7)
Neutrophils Relative %: 45 %
Platelets: 153 10*3/uL (ref 150–400)
RBC: 4.24 MIL/uL (ref 3.87–5.11)
RDW: 12.5 % (ref 11.5–15.5)
WBC: 5.9 10*3/uL (ref 4.0–10.5)
nRBC: 0 % (ref 0.0–0.2)

## 2018-05-06 LAB — URINALYSIS, ROUTINE W REFLEX MICROSCOPIC
Bilirubin Urine: NEGATIVE
Glucose, UA: NEGATIVE mg/dL
Ketones, ur: NEGATIVE mg/dL
Nitrite: NEGATIVE
Protein, ur: 30 mg/dL — AB
RBC / HPF: >50 RBC/hpf — ABNORMAL HIGH (ref 0–5)
Specific Gravity, Urine: 1.026 (ref 1.005–1.030)
pH: 6 (ref 5.0–8.0)

## 2018-05-06 LAB — WET PREP, GENITAL
Sperm: NONE SEEN
Trich, Wet Prep: NONE SEEN
Yeast Wet Prep HPF POC: NONE SEEN

## 2018-05-06 LAB — BASIC METABOLIC PANEL
Anion gap: 8 (ref 5–15)
BUN: 11 mg/dL (ref 6–20)
CO2: 24 mmol/L (ref 22–32)
Calcium: 9.3 mg/dL (ref 8.9–10.3)
Chloride: 106 mmol/L (ref 98–111)
Creatinine, Ser: 0.79 mg/dL (ref 0.44–1.00)
GFR calc Af Amer: >60 mL/min (ref >60)
GFR calc non Af Amer: >60 mL/min (ref >60)
Glucose, Bld: 94 mg/dL (ref 70–99)
Potassium: 3.9 mmol/L (ref 3.5–5.1)
Sodium: 138 mmol/L (ref 135–145)

## 2018-05-06 MED ORDER — METRONIDAZOLE 500 MG PO TABS: 500.0000 mg | ORAL_TABLET | Freq: Two times a day (BID) | ORAL | 0 refills | Status: DC

## 2018-05-06 NOTE — ED Triage Notes (Signed)
Pt reports recent partial hysterectomy and is now having continued vaginal bleeding and states "I am not supposed to be bleeding at all". Pt a.o, nad noted

## 2018-05-06 NOTE — ED Notes (Signed)
Pt. Ambulate to restroom to provide UA

## 2018-05-06 NOTE — ED Provider Notes (Signed)
[facility] EMERGENCY DEPARTMENT Provider Note   CSN: [id number] Arrival date & time: 05/06/18  1707    History   Chief Complaint Chief Complaint  Patient presents with  . Vaginal Bleeding    HPI Christine Brooks is a 38 y.o. female.     Patient is a 38 year old female with past medical history of pelvic pain, dysuria 18 who presents to the emergency department for vaginal bleeding.  patient reports that she has had some vaginal bleeding off and on every other day since the beginning of 2020.  Patient reports that she actually saw her surgeon who did her partial hysterectomy in [place] yesterday.  She reports that she was told that she had cyst on her left ovary but that there was no known cause of the bleeding  Today she went to the bathroom and she wiped and and saw blood on the tissue.  Reports that she knows that this is vaginal bleeding and not bleeding from her urethra. Reports the bleeding then went away.   She reports that she has chronic fullness and pain in the left side of her lower quadrant related to a cyst which has not changed recently.  She does not currently have an OB/GYN in this area.  She has seen both obgyn and urology for her issues in the past. Patient is tearful and frustrated that she does not have an answer to her vaginal bleeding.  She reports that she is not sexually active and has not had any other vaginal discharge.     Past Medical History:  Diagnosis Date  . Bowel obstruction (HCC)   . NOIBBCWU(889.1)     Patient Active Problem List   Diagnosis Date Noted  . Pelvic pressure in female 12/22/2015  . Episodic tension-type headache, not intractable 08/16/2015  . Occipital neuralgia of left side 04/20/2014  . Headache(784.0) 02/25/2013    Past Surgical History:  Procedure Laterality Date  . CESAREAN SECTION    . DILATION AND CURETTAGE OF UTERUS    . LAPAROSCOPY    . PARTIAL HYSTERECTOMY       OB History    Gravida  3   Para  2   Term  2   Preterm      AB  1   Living  2     SAB      TAB  1   Ectopic      Multiple      Live Births  2            Home Medications    Prior to Admission medications   Medication Sig Start Date End Date Taking? Authorizing Provider  Chlorphen-PE-Acetaminophen (NOREL AD) 4-10-325 MG TABS Take 1 tablet by mouth every 4 (four) hours as needed. Patient not taking: Reported on 01/25/2016 01/17/16   Hetty Blend L, NP-C  gabapentin (NEURONTIN) 100 MG capsule Take 2 capsules at bedtime Patient not taking: Reported on 01/25/2016 08/16/15   Van Clines, MD  ibuprofen (ADVIL,MOTRIN) 200 MG tablet Take 800 mg by mouth every 6 (six) hours as needed for moderate pain.    [provider]  methocarbamol (ROBAXIN) 500 MG tablet Take 1 tablet (500 mg total) by mouth 2 (two) times daily. 09/07/16   Garlon Hatchet, PA-C  metroNIDAZOLE (FLAGYL) 500 MG tablet Take 1 tablet (500 mg total) by mouth 2 (two) times daily. 05/06/18   Ronnie Doss A, PA-C  naproxen (NAPROSYN) 500 MG tablet Take 1 tablet (  500 mg total) by mouth 2 (two) times daily with a meal. 09/07/16   Garlon Hatchet, PA-C    Family History Family History  Problem Relation Age of Onset  . Healthy Mother   . Ataxia Neg Hx   . Chorea Neg Hx   . Dementia Neg Hx   . Mental retardation Neg Hx   . Migraines Neg Hx   . Multiple sclerosis Neg Hx   . Neurofibromatosis Neg Hx   . Neuropathy Neg Hx   . Parkinsonism Neg Hx   . Seizures Neg Hx   . Stroke Neg Hx     Social History Social History   Tobacco Use  . Smoking status: Current Some Day Smoker    Types: Cigarettes  . Smokeless tobacco: Never Used  Substance Use Topics  . Alcohol use: Not Currently    Alcohol/week: 0.0 standard drinks    Comment: rare  . Drug use: No     Allergies   Patient has no known allergies.   Review of Systems Review of Systems  Constitutional: Negative for activity change, appetite change, chills and fever.   HENT: Negative for ear pain and sore throat.   Eyes: Negative for pain and visual disturbance.  Respiratory: Negative for cough and shortness of breath.   Cardiovascular: Negative for chest pain and palpitations.  Gastrointestinal: Positive for abdominal pain. Negative for vomiting.  Genitourinary: Positive for menstrual problem, pelvic pain and vaginal bleeding. Negative for decreased urine volume, difficulty urinating, dysuria, hematuria, urgency, vaginal discharge and vaginal pain.  Musculoskeletal: Negative for arthralgias and back pain.  Skin: Negative for color change and rash.  Neurological: Negative for seizures and syncope.  All other systems reviewed and are negative.    Physical Exam Updated Vital Signs BP (!) 146/93 (BP Location: Right Arm)   Pulse 89   Temp 98.7 F (37.1 C) (Oral)   Resp 18   LMP 08/11/2016 (Exact Date)   SpO2 100%   Physical Exam Vitals signs and nursing note reviewed. Exam conducted with a chaperone present.  Constitutional:      General: She is not in acute distress.    Appearance: She is well-developed.  HENT:     Head: Normocephalic and atraumatic.  Eyes:     Conjunctiva/sclera: Conjunctivae normal.  Neck:     Musculoskeletal: Neck supple.  Cardiovascular:     Rate and Rhythm: Normal rate and regular rhythm.     Heart sounds: No murmur.  Pulmonary:     Effort: Pulmonary effort is normal. No respiratory distress.     Breath sounds: Normal breath sounds.  Abdominal:     Palpations: Abdomen is soft.     Tenderness: There is no abdominal tenderness.  Genitourinary:    General: Normal vulva.     Comments: There is no cervix visualized.  There is no vaginal bleeding or injury. Skin:    General: Skin is warm and dry.  Neurological:     Mental Status: She is alert.      ED Treatments / Results  Labs (all labs ordered are listed, but only abnormal results are displayed) Labs Reviewed  WET PREP, GENITAL - Abnormal; Notable for the  following components:      Result Value   Clue Cells Wet Prep HPF POC PRESENT (*)    WBC, Wet Prep HPF POC MODERATE (*)    All other components within normal limits  URINALYSIS, ROUTINE W REFLEX MICROSCOPIC - Abnormal; Notable for the following components:  APPearance HAZY (*)    Hgb urine dipstick LARGE (*)    Protein, ur 30 (*)    Leukocytes,Ua TRACE (*)    RBC / HPF >50 (*)    Bacteria, UA RARE (*)    All other components within normal limits  CBC WITH DIFFERENTIAL/PLATELET - Abnormal; Notable for the following components:   Hemoglobin 11.8 (*)    HCT 34.3 (*)    All other components within normal limits  URINE CULTURE  BASIC METABOLIC PANEL  URINALYSIS, ROUTINE W REFLEX MICROSCOPIC  GC/CHLAMYDIA PROBE AMP ([place]) NOT AT [facility]    EKG None  Radiology US Transvaginal Non-ob  Result Date: 05/06/2018 CLINICAL DATA:  Vaginal bleeding EXAM: TRANSABDOMINAL AND TRANSVAGINAL ULTRASOUND OF PELVIS TECHNIQUE: Both transabdominal and transvaginal ultrasound examinations of the pelvis were performed. Transabdominal technique was performed for global imaging of the pelvis including uterus, ovaries, adnexal regions, and pelvic cul-de-sac. It was necessary to proceed with endovaginal exam following the transabdominal exam to visualize the ovaries and adnexa. COMPARISON:  12/20/2015 FINDINGS: Uterus Measurements: Prior hysterectomy Endometrium Thickness: Prior hysterectomy. Right ovary Measurements: 3.2 x 2.2 x 1.8 cm = volume: 7 mL. Normal appearance/no adnexal mass. Small follicles. Left ovary Measurements: 2.2 x 3.5 x 3.0 cm = volume: 12.6 mL. Small follicles. 1.4 cm complex follicle or cyst. Other findings Small amount of free fluid. IMPRESSION: Prior hysterectomy. No ovarian/adnexal mass.  Small amount of free fluid. Electronically Signed   By: Charlett Nose M.D.   On: 05/06/2018 19:57   US Pelvis Complete  Result Date: 05/06/2018 CLINICAL DATA:  Vaginal bleeding EXAM: TRANSABDOMINAL AND  TRANSVAGINAL ULTRASOUND OF PELVIS TECHNIQUE: Both transabdominal and transvaginal ultrasound examinations of the pelvis were performed. Transabdominal technique was performed for global imaging of the pelvis including uterus, ovaries, adnexal regions, and pelvic cul-de-sac. It was necessary to proceed with endovaginal exam following the transabdominal exam to visualize the ovaries and adnexa. COMPARISON:  12/20/2015 FINDINGS: Uterus Measurements: Prior hysterectomy Endometrium Thickness: Prior hysterectomy. Right ovary Measurements: 3.2 x 2.2 x 1.8 cm = volume: 7 mL. Normal appearance/no adnexal mass. Small follicles. Left ovary Measurements: 2.2 x 3.5 x 3.0 cm = volume: 12.6 mL. Small follicles. 1.4 cm complex follicle or cyst. Other findings Small amount of free fluid. IMPRESSION: Prior hysterectomy. No ovarian/adnexal mass.  Small amount of free fluid. Electronically Signed   By: Charlett Nose M.D.   On: 05/06/2018 19:57    Procedures Procedures (including critical care time)  Medications Ordered in ED Medications - No data to display   Initial Impression / Assessment and Plan / ED Course  I have reviewed the triage vital signs and the nursing notes.  Pertinent labs & imaging results that were available during my care of the patient were reviewed by me and considered in my medical decision making (see chart for details).  Clinical Course as of May 05 2021  Tue May 06, 2018  2019 I have no etiology for patient's vaginal bleeding.  This may even be urethral bleeding.  Patient reports that she sees a urologist.  I am going to refer her to [facility] hospital here for further investigation of her vaginal bleeding.  She did not have any vaginal bleeding on my exam.  Labs are stable.  I am going to treat her for vaginitis due to clue cells on her wet prep.   [KM]    Clinical Course User Index [KM] Arlyn Dunning, PA-C         Final  Clinical Impressions(s) / ED Diagnoses   Final diagnoses:   Vaginal bleeding  Acute vaginitis    ED Discharge Orders         Ordered    metroNIDAZOLE (FLAGYL) 500 MG tablet  2 times daily     05/06/18 2021           Jeral PinchMcLean, Nolberto Cheuvront A, PA-C 05/06/18 2023    Virgina Norfolkuratolo, Adam, DO 05/07/18 1106

## 2018-05-06 NOTE — Discharge Instructions (Addendum)
Thank you for allowing me to care for you today. Please return to the emergency department if you have new or worsening symptoms. Take your medications as instructed.  ° °

## 2018-05-07 LAB — GC/CHLAMYDIA PROBE AMP (~~LOC~~) NOT AT ARMC
Chlamydia: NEGATIVE
Neisseria Gonorrhea: NEGATIVE

## 2018-05-07 LAB — URINE CULTURE

## 2018-05-16 ENCOUNTER — Other Ambulatory Visit: Payer: Self-pay

## 2018-05-16 ENCOUNTER — Encounter (HOSPITAL_COMMUNITY): Payer: Self-pay | Admitting: Emergency Medicine

## 2018-05-16 ENCOUNTER — Emergency Department (HOSPITAL_COMMUNITY)
Admission: EM | Admit: 2018-05-16 | Discharge: 2018-05-17 | Disposition: A | Discharge status: Home / Self Care | Payer: Medicaid Other | Attending: Emergency Medicine | Admitting: Emergency Medicine

## 2018-05-16 DIAGNOSIS — R1032 Left lower quadrant pain: Secondary | ICD-10-CM | POA: Diagnosis not present

## 2018-05-16 DIAGNOSIS — F17210 Nicotine dependence, cigarettes, uncomplicated: Secondary | ICD-10-CM | POA: Insufficient documentation

## 2018-05-16 DIAGNOSIS — R319 Hematuria, unspecified: Secondary | ICD-10-CM | POA: Diagnosis present

## 2018-05-16 DIAGNOSIS — N200 Calculus of kidney: Secondary | ICD-10-CM | POA: Diagnosis not present

## 2018-05-16 DIAGNOSIS — N83201 Unspecified ovarian cyst, right side: Secondary | ICD-10-CM | POA: Diagnosis not present

## 2018-05-16 NOTE — ED Triage Notes (Addendum)
C/o recurrent hematuria since yesterday and intermittent L flank pain.  States she was seen in ED on 4/21 and followed up with her urologist in [facility].  States she is supposed to have a procedure at urologist in [facility] on Monday but is having increased pain and bleeding.

## 2018-05-17 ENCOUNTER — Emergency Department (HOSPITAL_COMMUNITY): Payer: Medicaid Other

## 2018-05-17 LAB — BASIC METABOLIC PANEL
Anion gap: 11 (ref 5–15)
BUN: 11 mg/dL (ref 6–20)
CO2: 22 mmol/L (ref 22–32)
Calcium: 9.5 mg/dL (ref 8.9–10.3)
Chloride: 105 mmol/L (ref 98–111)
Creatinine, Ser: 0.8 mg/dL (ref 0.44–1.00)
GFR calc Af Amer: >60 mL/min (ref >60)
GFR calc non Af Amer: >60 mL/min (ref >60)
Glucose, Bld: 97 mg/dL (ref 70–99)
Potassium: 3.5 mmol/L (ref 3.5–5.1)
Sodium: 138 mmol/L (ref 135–145)

## 2018-05-17 LAB — CBC WITH DIFFERENTIAL/PLATELET
Abs Immature Granulocytes: 0.02 10*3/uL (ref 0.00–0.07)
Basophils Absolute: 0 10*3/uL (ref 0.0–0.1)
Basophils Relative: 0 %
Eosinophils Absolute: 0.1 10*3/uL (ref 0.0–0.5)
Eosinophils Relative: 1 %
HCT: 34.5 % — ABNORMAL LOW (ref 36.0–46.0)
Hemoglobin: 12.1 g/dL (ref 12.0–15.0)
Immature Granulocytes: 0 %
Lymphocytes Relative: 39 %
Lymphs Abs: 3.2 10*3/uL (ref 0.7–4.0)
MCH: 28.1 pg (ref 26.0–34.0)
MCHC: 35.1 g/dL (ref 30.0–36.0)
MCV: 80 fL (ref 80.0–100.0)
Monocytes Absolute: 0.8 10*3/uL (ref 0.1–1.0)
Monocytes Relative: 10 %
Neutro Abs: 4 10*3/uL (ref 1.7–7.7)
Neutrophils Relative %: 50 %
Platelets: 153 10*3/uL (ref 150–400)
RBC: 4.31 MIL/uL (ref 3.87–5.11)
RDW: 12.6 % (ref 11.5–15.5)
WBC: 8 10*3/uL (ref 4.0–10.5)
nRBC: 0 % (ref 0.0–0.2)

## 2018-05-17 LAB — URINALYSIS, ROUTINE W REFLEX MICROSCOPIC
Bacteria, UA: NONE SEEN
Bilirubin Urine: NEGATIVE
Glucose, UA: NEGATIVE mg/dL
Ketones, ur: 5 mg/dL — AB
Leukocytes,Ua: NEGATIVE
Nitrite: NEGATIVE
Protein, ur: 30 mg/dL — AB
RBC / HPF: >50 RBC/hpf — ABNORMAL HIGH (ref 0–5)
Specific Gravity, Urine: 1.027 (ref 1.005–1.030)
pH: 5 (ref 5.0–8.0)

## 2018-05-17 LAB — I-STAT BETA HCG BLOOD, ED (MC, WL, AP ONLY): I-stat hCG, quantitative: <5 m[IU]/mL (ref <5)

## 2018-05-17 MED ORDER — OXYCODONE-ACETAMINOPHEN 5-325 MG PO TABS: 1.0000 | ORAL_TABLET | Freq: Four times a day (QID) | ORAL | 0 refills | Status: DC | PRN

## 2018-05-17 MED ORDER — TAMSULOSIN HCL 0.4 MG PO CAPS: 0.4000 mg | ORAL_CAPSULE | Freq: Every day | ORAL | 0 refills | Status: DC

## 2018-05-17 NOTE — ED Provider Notes (Signed)
Emergency Department Provider Note   I have reviewed the triage vital signs and the nursing notes.   HISTORY  Chief Complaint Hematuria and Flank Pain   HPI Christine Brooks is a 38 y.o. female with medical problems documented below who presents the emergency department today with recurrent hematuria.  Patient states this is happened to her multiple times in the past this was just started earlier today.  She was seen here couple weeks ago and diagnosed with urinary tract infection and she thinks it does get better and she has appoint with urology on Monday for what sounds like a cystoscopy.  However she started having left-sided back pain earlier tonight with hematuria.  No fever, abdominal pain or other associated symptoms.   No other associated or modifying symptoms.    Past Medical History:  Diagnosis Date   Bowel obstruction (HCC)    Headache(784.0)     Patient Active Problem List   Diagnosis Date Noted   Pelvic pressure in female 12/22/2015   Episodic tension-type headache, not intractable 08/16/2015   Occipital neuralgia of left side 04/20/2014   Headache(784.0) 02/25/2013    Past Surgical History:  Procedure Laterality Date   CESAREAN SECTION     DILATION AND CURETTAGE OF UTERUS     LAPAROSCOPY     PARTIAL HYSTERECTOMY      Current Outpatient Rx   Order #: [id number] Class: Print   Order #: [id number] Class: Print    Allergies Patient has no known allergies.  Family History  Problem Relation Age of Onset   Healthy Mother    Ataxia Neg Hx    Chorea Neg Hx    Dementia Neg Hx    Mental retardation Neg Hx    Migraines Neg Hx    Multiple sclerosis Neg Hx    Neurofibromatosis Neg Hx    Neuropathy Neg Hx    Parkinsonism Neg Hx    Seizures Neg Hx    Stroke Neg Hx     Social History Social History   Tobacco Use   Smoking status: Current Some Day Smoker    Types: Cigarettes   Smokeless tobacco: Never Used  Substance Use Topics    Alcohol use: Not Currently    Alcohol/week: 0.0 standard drinks    Comment: rare   Drug use: No    Review of Systems  All other systems negative except as documented in the HPI. All pertinent positives and negatives as reviewed in the HPI. ____________________________________________   PHYSICAL EXAM:  VITAL SIGNS: ED Triage Vitals  Enc Vitals Group     BP 05/16/18 2323 (!) 152/90     Pulse Rate 05/16/18 2323 (!) 101     Resp 05/16/18 2323 18     Temp 05/16/18 2323 98.2 F (36.8 C)     Temp Source 05/16/18 2323 Oral     SpO2 05/16/18 2323 100 %    Constitutional: Alert and oriented. Well appearing and in no acute distress. Eyes: Conjunctivae are normal. PERRL. EOMI. Head: Atraumatic. Nose: No congestion/rhinnorhea. Mouth/Throat: Mucous membranes are moist.  Oropharynx non-erythematous. Neck: No stridor.  No meningeal signs.   Cardiovascular: Normal rate, regular rhythm. Good peripheral circulation. Grossly normal heart sounds.   Respiratory: Normal respiratory effort.  No retractions. Lungs CTAB. Gastrointestinal: Soft and nontender. No distention.  Musculoskeletal: No lower extremity tenderness nor edema. No gross deformities of extremities. Neurologic:  Normal speech and language. No gross focal neurologic deficits are appreciated.  Skin:  Skin is warm, dry and  intact. No rash noted.  ____________________________________________   LABS (all labs ordered are listed, but only abnormal results are displayed)  Labs Reviewed  URINALYSIS, ROUTINE W REFLEX MICROSCOPIC - Abnormal; Notable for the following components:      Result Value   APPearance HAZY (*)    Hgb urine dipstick LARGE (*)    Ketones, ur 5 (*)    Protein, ur 30 (*)    RBC / HPF >50 (*)    All other components within normal limits  CBC WITH DIFFERENTIAL/PLATELET - Abnormal; Notable for the following components:   HCT 34.5 (*)    All other components within normal limits  URINE CULTURE  BASIC  METABOLIC PANEL  I-STAT BETA HCG BLOOD, ED ([facility], [facility], [facility] ONLY)   ____________________________________________   RADIOLOGY  Ct Renal Stone Study  Result Date: 05/17/2018 CLINICAL DATA:  Initial evaluation for recurrent hematuria. EXAM: CT ABDOMEN AND PELVIS WITHOUT CONTRAST TECHNIQUE: Multidetector CT imaging of the abdomen and pelvis was performed following the standard protocol without IV contrast. COMPARISON:  Prior ultrasound from 06/05/2018. FINDINGS: Lower chest: 6 mm ground-glass nodule at the right lung base, indeterminate (series 5, image 25). Visualized lungs are otherwise clear. Hepatobiliary: Limited noncontrast evaluation of the liver is unremarkable. Gallbladder within normal limits. No biliary dilatation. Pancreas: Pancreas within normal limits. Spleen: Unremarkable. Adrenals/Urinary Tract: Adrenal glands are normal. Kidneys equal in size 4 mm nonobstructive stone present within the lower pole the right kidney. Punctate 2 mm nonobstructive stone present within the lower pole left kidney. Punctate 2 mm calcification at the region of the left UVJ suspicious for a small distal ureteral stone (series 3, image 65). No significant hydronephrosis or hydroureter. No other definite ureteral calculi. Bladder otherwise decompressed without acute finding. Stomach/Bowel: Stomach decompressed without acute finding. No evidence for bowel obstruction. No acute inflammatory changes about the bowels. No evidence for acute appendicitis. Vascular/Lymphatic: Intra-abdominal aorta of normal caliber. No adenopathy. Reproductive: Prior hysterectomy. 5.5 by 4.0 cm mildly complex right adnexal cyst with internal fluid fluid level, suspected to reflect a hemorrhagic cyst. Other: Small volume free fluid within the pelvis. No free intraperitoneal air. Musculoskeletal: No acute osseous finding. No discrete lytic or blastic osseous lesions. IMPRESSION: 1. Punctate 2 mm calcification at the expected location of the left UVJ,  suspicious for a tiny distal left ureteral stone. No associated obstructive uropathy. 2. Additional bilateral nonobstructive nephrolithiasis as above. 3. 5.5 cm complex right adnexal cyst, suspected to reflect a hemorrhagic cyst. A follow-up ultrasound in 6-12 weeks suggested for further evaluation. Associated small volume free fluid within the pelvis. 4. No other acute intra-abdominal or pelvic process. Electronically Signed   By: Rise MuBenjamin  McClintock M.D.   On: 05/17/2018 02:39    ____________________________________________   INITIAL IMPRESSION / ASSESSMENT AND PLAN / ED COURSE  Patient ultimately found to have a kidney stone but she also has a hemorrhagic cyst.  She seen urology already so we will put on symptomatic treatment.  Patient stable for discharge this time.  Tolerating p.o.  Pain controlled without pain medication here.  Pertinent labs & imaging results that were available during my care of the patient were reviewed by me and considered in my medical decision making (see chart for details).  A medical screening exam was performed and I feel the patient has had an appropriate workup for their chief complaint at this time and likelihood of emergent condition existing is low. They have been counseled on decision, discharge, follow up and which symptoms  necessitate immediate return to the emergency department. They or their family verbally stated understanding and agreement with plan and discharged in stable condition.   ____________________________________________  FINAL CLINICAL IMPRESSION(S) / ED DIAGNOSES  Final diagnoses:  Kidney stone    MEDICATIONS GIVEN DURING THIS VISIT:  Medications - No data to display   NEW OUTPATIENT MEDICATIONS STARTED DURING THIS VISIT:  New Prescriptions   OXYCODONE-ACETAMINOPHEN (PERCOCET) 5-325 MG TABLET    Take 1 tablet by mouth every 6 (six) hours as needed for severe pain.   TAMSULOSIN (FLOMAX) 0.4 MG CAPS CAPSULE    Take 1 capsule (0.4 mg  total) by mouth daily.    Note:  This note was prepared with assistance of Dragon voice recognition software. Occasional wrong-word or sound-a-like substitutions may have occurred due to the inherent limitations of voice recognition software.   Natika Geyer, Barbara Cower, MD 05/17/18 [phone]

## 2018-05-18 LAB — URINE CULTURE: Culture: >=100000 — AB

## 2018-05-19 ENCOUNTER — Telehealth: Payer: Self-pay

## 2018-05-19 NOTE — Telephone Encounter (Signed)
Post ED Visit - Positive Culture Follow-up  Culture report reviewed by antimicrobial stewardship pharmacist: [facility] []  Enzo Bi, Pharm.D. []  Celedonio Miyamoto, Pharm.D., BCPS AQ-ID []  Garvin Fila, Pharm.D., BCPS []  Georgina Pillion, [address].D., BCPS []  [place], [address].D., BCPS, AAHIVP []  Estella Husk, Pharm.D., BCPS, AAHIVP []  Lysle Pearl, PharmD, BCPS []  Phillips Climes, PharmD, BCPS []  Agapito Games, PharmD, BCPS []  Verlan Friends, PharmD []  Mervyn Gay, PharmD, BCPS []  Vinnie Level, PharmD [facility] []  Len Childs, PharmD []  Greer Pickerel, PharmD []  Adalberto Cole, PharmD []  Perlie Gold, Rph []  Lonell Face) Jean Rosenthal, PharmD []  Earl Many, PharmD []  Junita Push, PharmD []  Dorna Leitz, PharmD []  Terrilee Files, PharmD []  Lynann Beaver, PharmD []  Keturah Barre, PharmD []  Loralee Pacas, PharmD []  Bernadene Person, PharmD   Positive urine culture  Deferred to Uro  report faxed  [name] 05/19/2018, 8:40 AM

## 2018-05-19 NOTE — Progress Notes (Signed)
ED Antimicrobial Stewardship Positive Culture Follow Up   Christine Brooks is an 38 y.o. female who presented to [facility] on 05/16/2018 with a chief complaint of hematuria with new onset left sided back pain. Urine culture from 5/2 showed >= 100,000 colonies/mL lactobacillus species. CT scan on 05/17/18 showed kidney stone and hemorrhagic cyst. UA on 05/17/18 unremarkable. Patient not discharged on antibiotic treatment. Patient to follow up with outpatient urologist in [place], [place]. Urine culture results will be faxed to urology office.    Chief Complaint  Patient presents with  . Hematuria  . Flank Pain    Recent Results (from the past 720 hour(s))  Wet prep, genital     Status: Abnormal   Collection Time: 05/06/18  5:45 PM  Result Value Ref Range Status   Yeast Wet Prep HPF POC NONE SEEN NONE SEEN Final   Trich, Wet Prep NONE SEEN NONE SEEN Final   Clue Cells Wet Prep HPF POC PRESENT (A) NONE SEEN Final   WBC, Wet Prep HPF POC MODERATE (A) NONE SEEN Final   Sperm NONE SEEN  Final    Comment: Performed at [facility], [address] [place], [place] [address]  Urine culture     Status: Abnormal   Collection Time: 05/06/18  6:07 PM  Result Value Ref Range Status   Specimen Description URINE, RANDOM  Final   Special Requests   Final    NONE Performed at [facility], [address], [place], [place] [address]    Culture MULTIPLE SPECIES PRESENT, SUGGEST RECOLLECTION (A)  Final   Report Status 05/07/2018 FINAL  Final  Urine Culture     Status: Abnormal   Collection Time: 05/17/18  1:28 AM  Result Value Ref Range Status   Specimen Description URINE, RANDOM  Final   Special Requests NONE  Final   Culture (A)  Final    >=100,000 COLONIES/mL LACTOBACILLUS SPECIES Standardized susceptibility testing for this organism is not available. Performed at [facility], [address], [place], [place] [address]    Report Status 05/18/2018 FINAL  Final    New antibiotic  prescription: treatment deferred to urologist   ED Provider: Lyndel Safe, PA-C  Thank you for allowing pharmacy to be a part of this patient's care.  Lenord Carbo, PharmD PGY1 Pharmacy Resident Phone: [phone]  Please check AMION for all [facility] phone numbers

## 2018-06-23 ENCOUNTER — Ambulatory Visit: Payer: Self-pay | Admitting: Obstetrics and Gynecology

## 2018-10-03 ENCOUNTER — Encounter (HOSPITAL_COMMUNITY): Payer: Self-pay

## 2018-10-03 ENCOUNTER — Other Ambulatory Visit: Payer: Self-pay

## 2018-10-03 ENCOUNTER — Ambulatory Visit (HOSPITAL_COMMUNITY)
Admission: EM | Admit: 2018-10-03 | Discharge: 2018-10-03 | Disposition: A | Discharge status: Home / Self Care | Payer: Self-pay | Attending: Family Medicine | Admitting: Family Medicine

## 2018-10-03 DIAGNOSIS — R319 Hematuria, unspecified: Secondary | ICD-10-CM | POA: Insufficient documentation

## 2018-10-03 LAB — POCT URINALYSIS DIP (DEVICE)
Bilirubin Urine: NEGATIVE
Glucose, UA: NEGATIVE mg/dL
Ketones, ur: NEGATIVE mg/dL
Leukocytes,Ua: NEGATIVE
Nitrite: NEGATIVE
Protein, ur: NEGATIVE mg/dL
Specific Gravity, Urine: 1.025 (ref 1.005–1.030)
Urobilinogen, UA: 1 mg/dL (ref 0.0–1.0)
pH: 7 (ref 5.0–8.0)

## 2018-10-03 NOTE — ED Provider Notes (Signed)
[facility]    CSN: [id number] Arrival date & time: 10/03/18  1424      History   Chief Complaint Chief Complaint  Patient presents with  . Urinary Tract Infection    HPI Christine Brooks is a 38 y.o. female.   HPI  Patient states she is been having some urinary frequency.  No dysuria.  No abdominal pain.  No flank pain.  No nausea vomiting.  No vaginal bleeding or vaginal discharge.  She thinks this might be a urinary tract infection.  In review of the chart she has been under the care of GYN for endometriosis and for urology.  She has longstanding microscopic hematuria.  She has a history of endometriosis in her bladder.  She has had bladder irritative symptoms and has been on a number of medication for this.  Past Medical History:  Diagnosis Date  . Bowel obstruction (HCC)   . OEVOJJKK(938.1)     Patient Active Problem List   Diagnosis Date Noted  . Pelvic pressure in female 12/22/2015  . Episodic tension-type headache, not intractable 08/16/2015  . Occipital neuralgia of left side 04/20/2014  . Headache(784.0) 02/25/2013    Past Surgical History:  Procedure Laterality Date  . CESAREAN SECTION    . DILATION AND CURETTAGE OF UTERUS    . LAPAROSCOPY    . PARTIAL HYSTERECTOMY      OB History    Gravida  3   Para  2   Term  2   Preterm      AB  1   Living  2     SAB      TAB  1   Ectopic      Multiple      Live Births  2            Home Medications    Prior to Admission medications   Medication Sig Start Date End Date Taking? Authorizing Provider  oxyCODONE-acetaminophen (PERCOCET) 5-325 MG tablet Take 1 tablet by mouth every 6 (six) hours as needed for severe pain. 05/17/18   Mesner, Barbara Cower, MD  tamsulosin (FLOMAX) 0.4 MG CAPS capsule Take 1 capsule (0.4 mg total) by mouth daily. 05/17/18   Mesner, Barbara Cower, MD    Family History Family History  Problem Relation Age of Onset  . Healthy Mother   . Ataxia Neg Hx   . Chorea Neg Hx    . Dementia Neg Hx   . Mental retardation Neg Hx   . Migraines Neg Hx   . Multiple sclerosis Neg Hx   . Neurofibromatosis Neg Hx   . Neuropathy Neg Hx   . Parkinsonism Neg Hx   . Seizures Neg Hx   . Stroke Neg Hx     Social History Social History   Tobacco Use  . Smoking status: Current Some Day Smoker    Types: Cigarettes  . Smokeless tobacco: Never Used  Substance Use Topics  . Alcohol use: Not Currently    Alcohol/week: 0.0 standard drinks    Comment: rare  . Drug use: No     Allergies   Patient has no known allergies.   Review of Systems Review of Systems  Constitutional: Negative for chills and fever.  HENT: Negative for ear pain and sore throat.   Eyes: Negative for pain and visual disturbance.  Respiratory: Negative for cough and shortness of breath.   Cardiovascular: Negative for chest pain and palpitations.  Gastrointestinal: Negative for abdominal pain and vomiting.  Genitourinary: Positive for frequency. Negative for dysuria, flank pain and hematuria.  Musculoskeletal: Negative for arthralgias and back pain.  Skin: Negative for color change and rash.  Neurological: Negative for seizures and syncope.  All other systems reviewed and are negative.    Physical Exam Triage Vital Signs ED Triage Vitals  Enc Vitals Group     BP --      Pulse Rate 10/03/18 1452 68     Resp 10/03/18 1452 16     Temp 10/03/18 1452 98.4 F (36.9 C)     Temp src --      SpO2 10/03/18 1452 100 %     Weight --      Height --      Head Circumference --      Peak Flow --      Pain Score 10/03/18 1523 8     Pain Loc --      Pain Edu? --      Excl. in [place]? --    No data found.  Updated Vital Signs Pulse 68   Temp 98.4 F (36.9 C)   Resp 16   LMP 08/11/2016 (Exact Date)   SpO2 100%       Physical Exam Constitutional:      General: She is not in acute distress.    Appearance: She is well-developed and normal weight.  HENT:     Head: Normocephalic and  atraumatic.  Eyes:     Conjunctiva/sclera: Conjunctivae normal.     Pupils: Pupils are equal, round, and reactive to light.  Neck:     Musculoskeletal: Normal range of motion.  Cardiovascular:     Rate and Rhythm: Normal rate.  Pulmonary:     Effort: Pulmonary effort is normal. No respiratory distress.  Abdominal:     General: There is no distension.     Palpations: Abdomen is soft.     Tenderness: There is no abdominal tenderness. There is no right CVA tenderness or left CVA tenderness.  Musculoskeletal: Normal range of motion.  Skin:    General: Skin is warm and dry.  Neurological:     Mental Status: She is alert.      UC Treatments / Results  Labs (all labs ordered are listed, but only abnormal results are displayed) Labs Reviewed  POCT URINALYSIS DIP (DEVICE) - Abnormal; Notable for the following components:      Result Value   Hgb urine dipstick TRACE (*)    All other components within normal limits  URINE CULTURE    EKG   Radiology No results found.  Procedures Procedures (including critical care time)  Medications Ordered in UC Medications - No data to display  Initial Impression / Assessment and Plan / UC Course  I have reviewed the triage vital signs and the nursing notes.  Pertinent labs & imaging results that were available during my care of the patient were reviewed by me and considered in my medical decision making (see chart for details).  Clinical Course as of Oct 02 2025  Fri Oct 03, 2018  1532 POCT Urinalysis, Dipstick [YN]    Clinical Course User Index [YN] Eustace MooreNelson, Ellah Otte Sue, MD    Explained to the patient that microscopic hematuria is a longstanding finding for her.  There was no evidence of urinary tract infection although I will culture her urine just to be certain.  Her urinary frequency is likely due to serum other urology issue, and I recommend she follow-up with her urologist  Final Clinical Impressions(s) / UC Diagnoses   Final  diagnoses:  Hematuria, unspecified type     Discharge Instructions     Follow up with your GYN or your urologist We will call if the urine test shows infection   ED Prescriptions    None     PDMP not reviewed this encounter.   Raylene Everts, MD 10/03/18 2030

## 2018-10-03 NOTE — ED Triage Notes (Signed)
Pt presents with recurrent urinary tract infection; frequent urine output

## 2018-10-03 NOTE — Discharge Instructions (Addendum)
Follow up with your GYN or your urologist We will call if the urine test shows infection

## 2018-10-05 LAB — URINE CULTURE: Culture: 70000 — AB

## 2019-02-20 ENCOUNTER — Encounter (HOSPITAL_COMMUNITY): Payer: Self-pay

## 2019-02-20 ENCOUNTER — Telehealth (HOSPITAL_COMMUNITY): Payer: Self-pay | Admitting: Emergency Medicine

## 2019-02-20 ENCOUNTER — Other Ambulatory Visit: Payer: Self-pay

## 2019-02-20 ENCOUNTER — Ambulatory Visit (HOSPITAL_COMMUNITY)
Admission: EM | Admit: 2019-02-20 | Discharge: 2019-02-20 | Disposition: A | Discharge status: Home / Self Care | Payer: Self-pay | Attending: Family Medicine | Admitting: Family Medicine

## 2019-02-20 DIAGNOSIS — H9202 Otalgia, left ear: Secondary | ICD-10-CM

## 2019-02-20 HISTORY — DX: Endometriosis, unspecified: N80.9

## 2019-02-20 MED ORDER — FLUTICASONE PROPIONATE 50 MCG/ACT NA SUSP: 2.0000 | Freq: Every day | NASAL | 0 refills | Status: DC

## 2019-02-20 NOTE — ED Triage Notes (Signed)
Left sided ear pain x 2 weeks.  Has tried warm compress as well as ear drops she had for prev ear infection.  No relief. No pain.

## 2019-02-20 NOTE — Telephone Encounter (Signed)
Pharmacy change

## 2019-02-20 NOTE — ED Provider Notes (Signed)
[place]    CSN: [id number] Arrival date & time: 02/20/19  1440      History   Chief Complaint Chief Complaint  Patient presents with  . Otalgia    HPI Christine Brooks is a 39 y.o. female.   HPI   Ear pain No cough  Or cold symptoms No change In hearing No drainage  Past Medical History:  Diagnosis Date  . Bowel obstruction ([place])   . Endometriosis   . WNUUVOZD(664.4)     Patient Active Problem List   Diagnosis Date Noted  . Pelvic pressure in female 12/22/2015  . Episodic tension-type headache, not intractable 08/16/2015  . Occipital neuralgia of left side 04/20/2014  . Headache(784.0) 02/25/2013    Past Surgical History:  Procedure Laterality Date  . CESAREAN SECTION    . DILATION AND CURETTAGE OF UTERUS    . LAPAROSCOPY    . PARTIAL HYSTERECTOMY      OB History    Gravida  3   Para  2   Term  2   Preterm      AB  1   Living  2     SAB      TAB  1   Ectopic      Multiple      Live Births  2            Home Medications    Prior to Admission medications   Medication Sig Start Date End Date Taking? Authorizing Provider  fluticasone (FLONASE) 50 MCG/ACT nasal spray Place 2 sprays into both nostrils daily. 02/20/19   Raylene Everts, MD    Family History Family History  Problem Relation Age of Onset  . Healthy Mother   . Ataxia Neg Hx   . Chorea Neg Hx   . Dementia Neg Hx   . Mental retardation Neg Hx   . Migraines Neg Hx   . Multiple sclerosis Neg Hx   . Neurofibromatosis Neg Hx   . Neuropathy Neg Hx   . Parkinsonism Neg Hx   . Seizures Neg Hx   . Stroke Neg Hx     Social History Social History   Tobacco Use  . Smoking status: Former Smoker    Years: 1.00    Types: Cigarettes  . Smokeless tobacco: Never Used  Substance Use Topics  . Alcohol use: Not Currently    Alcohol/week: 0.0 standard drinks    Comment: rare  . Drug use: No     Allergies   Patient has no known allergies.   Review of  Systems Review of Systems  HENT: Positive for ear pain. Negative for congestion, ear discharge and hearing loss.      Physical Exam Triage Vital Signs ED Triage Vitals  Enc Vitals Group     BP 02/20/19 1510 129/84     Pulse Rate 02/20/19 1510 88     Resp 02/20/19 1510 16     Temp 02/20/19 1510 98.5 F (36.9 C)     Temp src --      SpO2 02/20/19 1510 99 %     Weight --      Height --      Head Circumference --      Peak Flow --      Pain Score 02/20/19 1505 0     Pain Loc --      Pain Edu? --      Excl. in [place]? --    No data  found.  Updated Vital Signs BP 129/84 (BP Location: Right Arm)   Pulse 88   Temp 98.5 F (36.9 C)   Resp 16   LMP 08/11/2016 (Exact Date)   SpO2 99%   Visual Acuity Right Eye Distance:   Left Eye Distance:   Bilateral Distance:    Right Eye Near:   Left Eye Near:    Bilateral Near:     Physical Exam Constitutional:      General: She is not in acute distress.    Appearance: She is well-developed.  HENT:     Head: Normocephalic and atraumatic.     Right Ear: Tympanic membrane, ear canal and external ear normal.     Left Ear: Tympanic membrane, ear canal and external ear normal.     Nose: Congestion present.     Mouth/Throat:     Mouth: Mucous membranes are moist.     Pharynx: No posterior oropharyngeal erythema.  Eyes:     Conjunctiva/sclera: Conjunctivae normal.     Pupils: Pupils are equal, round, and reactive to light.  Cardiovascular:     Rate and Rhythm: Normal rate.  Pulmonary:     Effort: Pulmonary effort is normal. No respiratory distress.  Abdominal:     General: There is no distension.     Palpations: Abdomen is soft.  Musculoskeletal:        General: Normal range of motion.     Cervical back: Normal range of motion.  Skin:    General: Skin is warm and dry.  Neurological:     Mental Status: She is alert.  Psychiatric:        Mood and Affect: Mood normal.        Behavior: Behavior normal.      UC Treatments /  Results  Labs (all labs ordered are listed, but only abnormal results are displayed) Labs Reviewed - No data to display  EKG   Radiology No results found.  Procedures Procedures (including critical care time)  Medications Ordered in UC Medications - No data to display  Initial Impression / Assessment and Plan / UC Course  I have reviewed the triage vital signs and the nursing notes.  Pertinent labs & imaging results that were available during my care of the patient were reviewed by me and considered in my medical decision making (see chart for details).      Final Clinical Impressions(s) / UC Diagnoses   Final diagnoses:  Left ear pain     Discharge Instructions     Use the flonase daily "pop" your ears to normalize the fluid See Ear doctor if symptoms do not go away   ED Prescriptions    Medication Sig Dispense Auth. Provider   fluticasone (FLONASE) 50 MCG/ACT nasal spray Place 2 sprays into both nostrils daily. 16 g Eustace Moore, MD     PDMP not reviewed this encounter.   Eustace Moore, MD 02/20/19 2102

## 2019-02-20 NOTE — Discharge Instructions (Signed)
Use the flonase daily "pop" your ears to normalize the fluid See Ear doctor if symptoms do not go away

## 2019-02-23 ENCOUNTER — Telehealth (HOSPITAL_COMMUNITY): Payer: Self-pay | Admitting: Emergency Medicine

## 2019-02-23 NOTE — Telephone Encounter (Signed)
Pt calling saying the nasal spray does not appear to be working to help with her ear issues.  She states she wants an antibiotic.    It was explained to pt that no signs of infection were seen during her visit 3 days ago.  If pt is still having issues she might want to wait another day or two to let the medication take full effect, also she could come back and be reevaluated to see if there are any changes.  Zyrtec or Claritan were also recommended to help with nasal congestion.  She could also follow up with ENT per her AVS.    Pt states she will continue with the nasal spray and try the allergy medication before anything else.

## 2019-08-24 ENCOUNTER — Ambulatory Visit (HOSPITAL_COMMUNITY)
Admission: EM | Admit: 2019-08-24 | Discharge: 2019-08-24 | Disposition: A | Discharge status: Home / Self Care | Payer: HRSA Program | Attending: Physician Assistant | Admitting: Physician Assistant

## 2019-08-24 ENCOUNTER — Other Ambulatory Visit: Payer: Self-pay

## 2019-08-24 ENCOUNTER — Encounter (HOSPITAL_COMMUNITY): Payer: Self-pay

## 2019-08-24 DIAGNOSIS — Z8616 Personal history of COVID-19: Secondary | ICD-10-CM

## 2019-08-24 DIAGNOSIS — B349 Viral infection, unspecified: Secondary | ICD-10-CM | POA: Diagnosis not present

## 2019-08-24 DIAGNOSIS — Z20822 Contact with and (suspected) exposure to covid-19: Secondary | ICD-10-CM | POA: Insufficient documentation

## 2019-08-24 HISTORY — DX: Personal history of COVID-19: Z86.16

## 2019-08-24 MED ORDER — ACETAMINOPHEN 325 MG PO TABS: 650.0000 mg | ORAL_TABLET | Freq: Four times a day (QID) | ORAL | 0 refills | Status: DC | PRN

## 2019-08-24 MED ORDER — BENZONATATE 100 MG PO CAPS: 100.0000 mg | ORAL_CAPSULE | Freq: Three times a day (TID) | ORAL | 0 refills | Status: AC

## 2019-08-24 MED ORDER — CEPACOL SORE THROAT 5.4 MG MT LOZG: 1.0000 | LOZENGE | OROMUCOSAL | 0 refills | Status: DC | PRN

## 2019-08-24 MED ORDER — FLUTICASONE PROPIONATE 50 MCG/ACT NA SUSP: 1.0000 | Freq: Every day | NASAL | 0 refills | Status: DC

## 2019-08-24 NOTE — ED Provider Notes (Signed)
[facility]    CSN: [id number] Arrival date & time: 08/24/19  1446      History   Chief Complaint Chief Complaint  Patient presents with  . COVID symptoms    HPI Christine Brooks is a 39 y.o. female.   Patient presents for 1 week of dry cough, body ache and headache.  She also reports loss of taste and smell.  She reports symptoms of been persistent for the last week.  Denies shortness of breath.  Reports cough is dry.  She reports symptoms started with a headache, headache is not overly bothersome now.  Slight sore throat.  Some nasal congestion as well.  Denies abdominal pain, nausea or vomiting.  Occasional loose stool.  She has had decreased appetite.  She has been trying to drink water.  She has been eating some watermelon and other fruits.  She reports sick contacts unclear if it they are Covid positive or not.     Past Medical History:  Diagnosis Date  . Bowel obstruction (HCC)   . Endometriosis   . UUEKCMKL(491.7)     Patient Active Problem List   Diagnosis Date Noted  . Pelvic pressure in female 12/22/2015  . Episodic tension-type headache, not intractable 08/16/2015  . Occipital neuralgia of left side 04/20/2014  . Headache(784.0) 02/25/2013    Past Surgical History:  Procedure Laterality Date  . CESAREAN SECTION    . DILATION AND CURETTAGE OF UTERUS    . LAPAROSCOPY    . PARTIAL HYSTERECTOMY      OB History    Gravida  3   Para  2   Term  2   Preterm      AB  1   Living  2     SAB      TAB  1   Ectopic      Multiple      Live Births  2            Home Medications    Prior to Admission medications   Medication Sig Start Date End Date Taking? Authorizing Provider  acetaminophen (TYLENOL) 325 MG tablet Take 2 tablets (650 mg total) by mouth every 6 (six) hours as needed. 08/24/19   Marcelis Wissner, Veryl Speak, PA-C  benzonatate (TESSALON) 100 MG capsule Take 1 capsule (100 mg total) by mouth 3 (three) times daily for 10 days. 08/24/19  09/03/19  Kayana Thoen, Veryl Speak, PA-C  fluticasone (FLONASE) 50 MCG/ACT nasal spray Place 1 spray into both nostrils daily. 08/24/19   Kendrah Lovern, Veryl Speak, PA-C  Menthol (CEPACOL SORE THROAT) 5.4 MG LOZG Use as directed 1 lozenge (5.4 mg total) in the mouth or throat every 2 (two) hours as needed. 08/24/19   Zakkery Dorian, Veryl Speak, PA-C    Family History Family History  Problem Relation Age of Onset  . Healthy Mother   . Ataxia Neg Hx   . Chorea Neg Hx   . Dementia Neg Hx   . Mental retardation Neg Hx   . Migraines Neg Hx   . Multiple sclerosis Neg Hx   . Neurofibromatosis Neg Hx   . Neuropathy Neg Hx   . Parkinsonism Neg Hx   . Seizures Neg Hx   . Stroke Neg Hx     Social History Social History   Tobacco Use  . Smoking status: Former Smoker    Years: 1.00    Types: Cigarettes  . Smokeless tobacco: Never Used  Vaping Use  . Vaping Use: Never  used  Substance Use Topics  . Alcohol use: Not Currently    Alcohol/week: 0.0 standard drinks    Comment: rare  . Drug use: No     Allergies   Patient has no known allergies.   Review of Systems Review of Systems   Physical Exam Triage Vital Signs ED Triage Vitals  Enc Vitals Group     BP 08/24/19 1719 122/86     Pulse Rate 08/24/19 1719 88     Resp 08/24/19 1719 16     Temp 08/24/19 1719 97.7 F (36.5 C)     Temp Source 08/24/19 1719 Oral     SpO2 --      Weight 08/24/19 1720 107 lb (48.5 kg)     Height 08/24/19 1720 5' (1.524 m)     Head Circumference --      Peak Flow --      Pain Score 08/24/19 1719 0     Pain Loc --      Pain Edu? --      Excl. in [place]? --    No data found.  Updated Vital Signs BP 122/86   Pulse 88   Temp 97.7 F (36.5 C) (Oral)   Resp 16   Ht 5' (1.524 m)   Wt 107 lb (48.5 kg)   LMP 08/11/2016 (Exact Date)   BMI 20.90 kg/m   Visual Acuity Right Eye Distance:   Left Eye Distance:   Bilateral Distance:    Right Eye Near:   Left Eye Near:    Bilateral Near:     Physical Exam Vitals and nursing note  reviewed.  Constitutional:      General: She is not in acute distress.    Appearance: She is well-developed. She is ill-appearing.  HENT:     Head: Normocephalic and atraumatic.     Nose: Congestion present.     Mouth/Throat:     Mouth: Mucous membranes are moist.     Pharynx: Oropharynx is clear.     Comments: Mild postnasal drip Eyes:     Conjunctiva/sclera: Conjunctivae normal.  Cardiovascular:     Rate and Rhythm: Normal rate and regular rhythm.     Heart sounds: No murmur heard.   Pulmonary:     Effort: Pulmonary effort is normal. No respiratory distress.     Breath sounds: Normal breath sounds. No wheezing, rhonchi or rales.     Comments: Moving air well.  Speaking full sentences.  Saturating at 98% on room air Abdominal:     Palpations: Abdomen is soft.     Tenderness: There is no abdominal tenderness.  Musculoskeletal:     Cervical back: Neck supple.     Right lower leg: No edema.     Left lower leg: No edema.  Skin:    General: Skin is warm and dry.     Findings: No rash.  Neurological:     Mental Status: She is alert.      UC Treatments / Results  Labs (all labs ordered are listed, but only abnormal results are displayed) Labs Reviewed  SARS CORONAVIRUS 2 (TAT 6-24 HRS)    EKG   Radiology No results found.  Procedures Procedures (including critical care time)  Medications Ordered in UC Medications - No data to display  Initial Impression / Assessment and Plan / UC Course  I have reviewed the triage vital signs and the nursing notes.  Pertinent labs & imaging results that were available during my care of  the patient were reviewed by me and considered in my medical decision making (see chart for details).     #Viral illness #Suspected COVID-19 Patient is a 39 year old sending with suspected COVID-19 infection.  Reassuring exam and vital signs.  We will treat her symptomatically.  Covid sent.  Return and emergency department precautions  discussed.  Patient verbalized understanding. Final Clinical Impressions(s) / UC Diagnoses   Final diagnoses:  Viral illness  Suspected COVID-19 virus infection     Discharge Instructions     Take the medicines as prescribed -Tessalon/benzonatate 1 tablet every 8 hours -Tylenol as prescribed or 6 hours for body ache and sore throat -Cepacol for sore throat -Flonase daily  Really push her fluids, drink plenty of water and add in Pedialyte.  Eat small healthy meals to include crackers and lean meats.  Fruits are also good  If symptoms are not improving over the next 4 to 5 days return or follow-up with your primary care  If developing shortness of breath, high fever, feel like you may faint or have severe abdominal pain go to the emergency department      ED Prescriptions    Medication Sig Dispense Auth. Provider   benzonatate (TESSALON) 100 MG capsule Take 1 capsule (100 mg total) by mouth 3 (three) times daily for 10 days. 30 capsule Eulalah Rupert, Veryl Speak, PA-C   Menthol (CEPACOL SORE THROAT) 5.4 MG LOZG Use as directed 1 lozenge (5.4 mg total) in the mouth or throat every 2 (two) hours as needed. 30 lozenge Megean Fabio, Veryl Speak, PA-C   acetaminophen (TYLENOL) 325 MG tablet Take 2 tablets (650 mg total) by mouth every 6 (six) hours as needed. 30 tablet Anijah Spohr, Veryl Speak, PA-C   fluticasone (FLONASE) 50 MCG/ACT nasal spray Place 1 spray into both nostrils daily. 15.8 mL Mak Bonny, Veryl Speak, PA-C     PDMP not reviewed this encounter.   Hermelinda Medicus, PA-C 08/24/19 1743

## 2019-08-24 NOTE — ED Triage Notes (Signed)
Pt c/o non productive cough, chills, loss of taste/smellx1.5 wks.

## 2019-08-24 NOTE — Discharge Instructions (Signed)
Take the medicines as prescribed -Tessalon/benzonatate 1 tablet every 8 hours -Tylenol as prescribed or 6 hours for body ache and sore throat -Cepacol for sore throat -Flonase daily  Really push her fluids, drink plenty of water and add in Pedialyte.  Eat small healthy meals to include crackers and lean meats.  Fruits are also good  If symptoms are not improving over the next 4 to 5 days return or follow-up with your primary care  If developing shortness of breath, high fever, feel like you may faint or have severe abdominal pain go to the emergency department

## 2019-08-25 ENCOUNTER — Encounter (HOSPITAL_COMMUNITY): Payer: Self-pay | Admitting: Nurse Practitioner

## 2019-08-25 LAB — SARS CORONAVIRUS 2 (TAT 6-24 HRS): SARS Coronavirus 2: POSITIVE — AB

## 2019-08-25 NOTE — Progress Notes (Signed)
Called to discuss with [name] about Covid symptoms and the use of regeneron, a monoclonal antibody infusion for those with mild to moderate Covid symptoms and at a high risk of hospitalization.     Pt is not qualified for this infusion due to lack of identified risk factors and co-morbid conditions.  Symptoms reviewed as well as criteria for ending isolation.  Symptoms reviewed that would warrant ED/Hospital evaluation as well should condition worsen. Preventative practices reviewed. Patient verbalized understanding.  Patient Active Problem List   Diagnosis Date Noted  . Pelvic pressure in female 12/22/2015  . Episodic tension-type headache, not intractable 08/16/2015  . Occipital neuralgia of left side 04/20/2014  . ZMCEYEMV(361.2) 02/25/2013   Consuello Masse, NP [facility]  [phone] [email or web address]

## 2019-09-02 ENCOUNTER — Other Ambulatory Visit (HOSPITAL_COMMUNITY)
Admission: RE | Admit: 2019-09-02 | Discharge: 2019-09-02 | Disposition: A | Discharge status: Home / Self Care | Payer: Self-pay | Source: Ambulatory Visit | Attending: Obstetrics | Admitting: Obstetrics

## 2019-09-02 ENCOUNTER — Other Ambulatory Visit: Payer: Self-pay

## 2019-09-02 ENCOUNTER — Encounter: Payer: Self-pay | Admitting: Obstetrics

## 2019-09-02 ENCOUNTER — Ambulatory Visit: Payer: Self-pay | Admitting: Obstetrics

## 2019-09-02 VITALS — BP 122/74 | HR 84 | Ht 60.0 in | Wt 107.0 lb

## 2019-09-02 DIAGNOSIS — R300 Dysuria: Secondary | ICD-10-CM

## 2019-09-02 DIAGNOSIS — Z01411 Encounter for gynecological examination (general) (routine) with abnormal findings: Secondary | ICD-10-CM

## 2019-09-02 DIAGNOSIS — Z01419 Encounter for gynecological examination (general) (routine) without abnormal findings: Secondary | ICD-10-CM

## 2019-09-02 DIAGNOSIS — N898 Other specified noninflammatory disorders of vagina: Secondary | ICD-10-CM

## 2019-09-02 DIAGNOSIS — N808 Other endometriosis: Secondary | ICD-10-CM

## 2019-09-02 DIAGNOSIS — R102 Pelvic and perineal pain: Secondary | ICD-10-CM

## 2019-09-02 DIAGNOSIS — N80A0 Endometriosis of bladder, unspecified depth: Secondary | ICD-10-CM

## 2019-09-02 DIAGNOSIS — Z90710 Acquired absence of both cervix and uterus: Secondary | ICD-10-CM

## 2019-09-02 LAB — POCT URINALYSIS DIPSTICK
Bilirubin, UA: NEGATIVE
Blood, UA: NEGATIVE
Glucose, UA: NEGATIVE
Leukocytes, UA: NEGATIVE
Nitrite, UA: NEGATIVE
Protein, UA: POSITIVE — AB
Spec Grav, UA: >=1.03 — AB (ref 1.010–1.025)
Urobilinogen, UA: 0.2 E.U./dL
pH, UA: 5 (ref 5.0–8.0)

## 2019-09-02 MED ORDER — FLUCONAZOLE 150 MG PO TABS: 150.0000 mg | ORAL_TABLET | Freq: Once | ORAL | 0 refills | Status: AC

## 2019-09-02 MED ORDER — IBUPROFEN 800 MG PO TABS: 800.0000 mg | ORAL_TABLET | Freq: Three times a day (TID) | ORAL | 5 refills | Status: DC | PRN

## 2019-09-02 NOTE — Progress Notes (Addendum)
GYN presents for yeast infection, white discharge, itching x 1 week, pelvic pain 10/10 and possible UTI .  Last PAP 12/01/2015.  Patient had Hysterectomy in 2019

## 2019-09-02 NOTE — Progress Notes (Signed)
Subjective:        Christine Brooks is a 39 y.o. female here for a routine exam.  Current complaints: Pelvic pain.  She has a history of endometriosis, and has had a TAH done in 2019 that was complicated by bowel obstruction post op.  She had a second surgery in which she was diagnosed with endometriosis of the bladder.  She now has chronic constant pelvic pain.  Personal health questionnaire:  Is patient [name], have a family history of breast and/or ovarian cancer: no Is there a family history of uterine cancer diagnosed at age < 62, gastrointestinal cancer, urinary tract cancer, family member who is a Personnel officer syndrome-associated carrier: no Is the patient overweight and hypertensive, family history of diabetes, personal history of gestational diabetes, preeclampsia or PCOS: no Is patient over 36, have PCOS,  family history of premature CHD under age 21, diabetes, smoke, have hypertension or peripheral artery disease:  no At any time, has a partner hit, kicked or otherwise hurt or frightened you?: no Over the past 2 weeks, have you felt down, depressed or hopeless?: no Over the past 2 weeks, have you felt little interest or pleasure in doing things?:no   Gynecologic History Patient's last menstrual period was 08/11/2016 (exact date). Contraception: status post hysterectomy Last Pap: 2017. Results were: normal Last mammogram: n/a. Results were: n/a  Obstetric History OB History  Gravida Para Term Preterm AB Living  3 2 2   1 2   SAB TAB Ectopic Multiple Live Births    1     2    # Outcome Date GA Lbr Len/2nd Weight Sex Delivery Anes PTL Lv  3 TAB 06/16/14          2 Term 07/29/07    M CS-LTranv   LIV  1 Term 12/07/97 [redacted]w[redacted]d   M CS-LTranv   LIV    Past Medical History:  Diagnosis Date  . Bowel obstruction (HCC)   . Endometriosis   . [redacted]w[redacted]d)     Past Surgical History:  Procedure Laterality Date  . CESAREAN SECTION    . DILATION AND CURETTAGE OF UTERUS    .  LAPAROSCOPY    . PARTIAL HYSTERECTOMY       Current Outpatient Medications:  .  acetaminophen (TYLENOL) 325 MG tablet, Take 2 tablets (650 mg total) by mouth every 6 (six) hours as needed., Disp: 30 tablet, Rfl: 0 .  benzonatate (TESSALON) 100 MG capsule, Take 1 capsule (100 mg total) by mouth 3 (three) times daily for 10 days. (Patient not taking: Reported on 09/02/2019), Disp: 30 capsule, Rfl: 0 .  fluconazole (DIFLUCAN) 150 MG tablet, Take 1 tablet (150 mg total) by mouth once for 1 dose., Disp: 1 tablet, Rfl: 0 .  fluticasone (FLONASE) 50 MCG/ACT nasal spray, Place 1 spray into both nostrils daily. (Patient not taking: Reported on 09/02/2019), Disp: 15.8 mL, Rfl: 0 .  ibuprofen (ADVIL) 800 MG tablet, Take 1 tablet (800 mg total) by mouth every 8 (eight) hours as needed., Disp: 30 tablet, Rfl: 5 .  Menthol (CEPACOL SORE THROAT) 5.4 MG LOZG, Use as directed 1 lozenge (5.4 mg total) in the mouth or throat every 2 (two) hours as needed. (Patient not taking: Reported on 09/02/2019), Disp: 30 lozenge, Rfl: 0 No Known Allergies  Social History   Tobacco Use  . Smoking status: Former Smoker    Years: 1.00    Types: Cigarettes  . Smokeless tobacco: Never Used  Substance Use Topics  .  Alcohol use: Not Currently    Alcohol/week: 0.0 standard drinks    Comment: rare    Family History  Problem Relation Age of Onset  . Healthy Mother   . Ataxia Neg Hx   . Chorea Neg Hx   . Dementia Neg Hx   . Mental retardation Neg Hx   . Migraines Neg Hx   . Multiple sclerosis Neg Hx   . Neurofibromatosis Neg Hx   . Neuropathy Neg Hx   . Parkinsonism Neg Hx   . Seizures Neg Hx   . Stroke Neg Hx       Review of Systems  Constitutional: negative for fatigue and weight loss Respiratory: negative for cough and wheezing Cardiovascular: negative for chest pain, fatigue and palpitations Gastrointestinal: positive for abdominal pain and negative for change in bowel habits Musculoskeletal:negative for  myalgias Neurological: negative for gait problems and tremors Behavioral/Psych: negative for abusive relationship, depression Endocrine: negative for temperature intolerance    Genitourinary:positive for pelvic pain and vaginal discharge and irritation Integument/breast: negative for breast lump, breast tenderness, nipple discharge and skin lesion(s)    Objective:       BP 122/74   Pulse 84   Ht 5' (1.524 m)   Wt 107 lb (48.5 kg)   LMP 08/11/2016 (Exact Date)   BMI 20.90 kg/m  General:   alert and no distress  Skin:   no rash or abnormalities  Lungs:   clear to auscultation bilaterally  Heart:   regular rate and rhythm, S1, S2 normal, no murmur, click, rub or gallop  Breasts:   normal without suspicious masses, skin or nipple changes or axillary nodes  Abdomen:  normal findings: no organomegaly, soft, non-tender and no hernia  Pelvis:  External genitalia: normal general appearance Urinary system: urethral meatus normal and bladder without fullness, nontender Vaginal: normal without tenderness, induration or masses Cervix: absent Adnexa: normal bimanual exam Uterus: absent, but a mass palpated in the area of the bladder, tender, firm   Lab Review Urine pregnancy test Labs reviewed yes Radiologic studies reviewed no  50% of 25 min visit spent on counseling and coordination of care.   Assessment:     1. Encounter for gynecological examination  2. Endometriosis of bladder Rx: - ibuprofen (ADVIL) 800 MG tablet; Take 1 tablet (800 mg total) by mouth every 8 (eight) hours as needed.  Dispense: 30 tablet; Refill: 5 - Ambulatory referral to Urogynecology - US PELVIC COMPLETE WITH TRANSVAGINAL; Future  3. Pelvic pain Rx: - US PELVIC COMPLETE WITH TRANSVAGINAL; Future  4. Status post total abdominal hysterectomy  5. Vaginal discharge Rx: - Cervicovaginal ancillary only( [place])  6. Vaginal irritation Rx: - fluconazole (DIFLUCAN) 150 MG tablet; Take 1 tablet (150  mg total) by mouth once for 1 dose.  Dispense: 1 tablet; Refill: 0  7. Dysuria Rx: - POCT Urinalysis Dipstick - Urine Culture    Plan:    Education reviewed: calcium supplements, depression evaluation, low fat, low cholesterol diet, safe sex/STD prevention, self breast exams and weight bearing exercise. Follow up in: 2 weeks.   Meds ordered this encounter  Medications  . ibuprofen (ADVIL) 800 MG tablet    Sig: Take 1 tablet (800 mg total) by mouth every 8 (eight) hours as needed.    Dispense:  30 tablet    Refill:  5  . fluconazole (DIFLUCAN) 150 MG tablet    Sig: Take 1 tablet (150 mg total) by mouth once for 1 dose.  Dispense:  1 tablet    Refill:  0   Orders Placed This Encounter  Procedures  . Urine Culture  . US PELVIC COMPLETE WITH TRANSVAGINAL    Standing Status:   Future    Standing Expiration Date:   09/01/2020    Order Specific Question:   Reason for Exam (SYMPTOM  OR DIAGNOSIS REQUIRED)    Answer:   Pelvic Pain.  Endometriosis of bladder.  Pelvic mass in area of bladder.    Order Specific Question:   Preferred imaging location?    Answer:   [facility]  . Ambulatory referral to Urogynecology    Referral Priority:   Routine    Referral Type:   Consultation    Referral Reason:   Specialty Services Required    Requested Specialty:   Urology    Number of Visits Requested:   1  . POCT Urinalysis Dipstick    Brock Bad, MD 09/02/2019 11:54 AM

## 2019-09-03 LAB — CERVICOVAGINAL ANCILLARY ONLY
Bacterial Vaginitis (gardnerella): NEGATIVE
Candida Glabrata: NEGATIVE
Candida Vaginitis: NEGATIVE
Chlamydia: NEGATIVE
Comment: NEGATIVE
Comment: NEGATIVE
Comment: NEGATIVE
Comment: NEGATIVE
Comment: NEGATIVE
Comment: NORMAL
Neisseria Gonorrhea: NEGATIVE
Trichomonas: NEGATIVE

## 2019-09-04 LAB — URINE CULTURE

## 2019-09-09 ENCOUNTER — Other Ambulatory Visit: Payer: Self-pay | Admitting: Obstetrics

## 2019-09-09 ENCOUNTER — Ambulatory Visit
Admission: RE | Admit: 2019-09-09 | Discharge: 2019-09-09 | Disposition: A | Discharge status: Home / Self Care | Payer: Self-pay | Source: Ambulatory Visit | Attending: Obstetrics | Admitting: Obstetrics

## 2019-09-09 ENCOUNTER — Other Ambulatory Visit: Payer: Self-pay

## 2019-09-09 DIAGNOSIS — N808 Other endometriosis: Secondary | ICD-10-CM | POA: Insufficient documentation

## 2019-09-09 DIAGNOSIS — N80A0 Endometriosis of bladder, unspecified depth: Secondary | ICD-10-CM

## 2019-09-09 DIAGNOSIS — R102 Pelvic and perineal pain: Secondary | ICD-10-CM | POA: Insufficient documentation

## 2019-09-14 ENCOUNTER — Encounter: Payer: Self-pay | Admitting: Obstetrics

## 2019-09-14 ENCOUNTER — Telehealth (INDEPENDENT_AMBULATORY_CARE_PROVIDER_SITE_OTHER): Payer: Self-pay | Admitting: Obstetrics

## 2019-09-14 DIAGNOSIS — N3289 Other specified disorders of bladder: Secondary | ICD-10-CM

## 2019-09-14 DIAGNOSIS — Z8742 Personal history of other diseases of the female genital tract: Secondary | ICD-10-CM

## 2019-09-14 DIAGNOSIS — B3731 Acute candidiasis of vulva and vagina: Secondary | ICD-10-CM

## 2019-09-14 DIAGNOSIS — Z90710 Acquired absence of both cervix and uterus: Secondary | ICD-10-CM

## 2019-09-14 DIAGNOSIS — B373 Candidiasis of vulva and vagina: Secondary | ICD-10-CM

## 2019-09-14 MED ORDER — FLUCONAZOLE 150 MG PO TABS: 150.0000 mg | ORAL_TABLET | Freq: Once | ORAL | 1 refills | Status: AC

## 2019-09-14 NOTE — Progress Notes (Addendum)
TELEHEALTH GYNECOLOGY VISIT ENCOUNTER NOTE  I connected with [name] on 09/14/19 at  8:45 AM EDT by telephone at home and verified that I am speaking with the correct person using two identifiers.   I discussed the limitations, risks, security and privacy concerns of performing an evaluation and management service by telephone and the availability of in person appointments. I also discussed with the patient that there may be a patient responsible charge related to this service. The patient expressed understanding and agreed to proceed.   History:  Christine Brooks is a 39 y.o. [phone] female being evaluated today for ultrasound results for pelvic pain and possible endometriosis of the bladder.  She denies any abnormal vaginal discharge or bleeding.     Past Medical History:  Diagnosis Date  . Bowel obstruction (HCC)   . Endometriosis   . JJHERDEY(814.4)    Past Surgical History:  Procedure Laterality Date  . CESAREAN SECTION    . DILATION AND CURETTAGE OF UTERUS    . LAPAROSCOPY    . PARTIAL HYSTERECTOMY     The following portions of the patient's history were reviewed and updated as appropriate: allergies, current medications, past family history, past medical history, past social history, past surgical history and problem list.     Review of Systems:  Pertinent items noted in HPI and remainder of comprehensive ROS otherwise negative.  Physical Exam:   General:  Alert, oriented and cooperative.   Mental Status: Normal mood and affect perceived. Normal judgment and thought content.  Physical exam deferred due to nature of the encounter  Labs and Imaging Results for orders placed or performed in visit on 09/02/19 (from the past 336 hour(s))  Cervicovaginal ancillary only( [place])   Collection Time: 09/02/19 10:39 AM  Result Value Ref Range   Neisseria Gonorrhea Negative    Chlamydia Negative    Trichomonas Negative    Bacterial Vaginitis (gardnerella) Negative      Candida Vaginitis Negative    Candida Glabrata Negative    Comment      Normal Reference Range Bacterial Vaginosis - Negative   Comment Normal Reference Range Candida Species - Negative    Comment Normal Reference Range Candida Galbrata - Negative    Comment Normal Reference Range Trichomonas - Negative    Comment Normal Reference [place] Chlamydia - Negative    Comment      Normal Reference Range Neisseria Gonorrhea - Negative  Urine Culture   Collection Time: 09/02/19 10:58 AM   Specimen: Urine   UC  Result Value Ref Range   Urine Culture, Routine Final report    Organism ID, Bacteria Comment   POCT Urinalysis Dipstick   Collection Time: 09/02/19 11:28 AM  Result Value Ref Range   Color, UA YELOW    Clarity, UA     Glucose, UA Negative Negative   Bilirubin, UA NEGATIVE    Ketones, UA TRACE    Spec Grav, UA >=1.030 (A) 1.010 - 1.025   Blood, UA NEGATIVE    pH, UA 5.0 5.0 - 8.0   Protein, UA Positive (A) Negative   Urobilinogen, UA 0.2 0.2 or 1.0 E.U./dL   Nitrite, UA NEGATIVE    Leukocytes, UA Negative Negative   Appearance     Odor     US PELVIC COMPLETE WITH TRANSVAGINAL  Result Date: 09/09/2019 CLINICAL DATA:  Pelvic pain, pelvic mass, history of endometriosis involving the bladder EXAM: TRANSABDOMINAL AND TRANSVAGINAL ULTRASOUND OF PELVIS TECHNIQUE: Both transabdominal and  transvaginal ultrasound examinations of the pelvis were performed. Transabdominal technique was performed for global imaging of the pelvis including uterus, ovaries, adnexal regions, and pelvic cul-de-sac. It was necessary to proceed with endovaginal exam following the transabdominal exam to visualize the ovaries. COMPARISON:  05/06/2018 Correlation: CT abdomen and pelvis 05/17/2018 FINDINGS: Uterus Surgically absent Endometrium N/A Right ovary Measurements: 5.1 x 3.7 x 5.0 cm = volume: 50 mL. Normal morphology without mass Left ovary Measurements: 5.1 x 3.95.5 cm = volume: 57 mL. Complex heterogeneous  hypoechoic nodule within LEFT ovary 3.3 x 2.9 x 3.0 cm containing scattered internal echogenicity and lacy internal architecture, appearance consistent with a hemorrhagic cyst. Other findings No free pelvic fluid. Mass identified at posterior bladder wall 3.1 x 1.6 x 3.2 cm, heterogeneous appearance, extending intraluminal, could represent a bladder neoplasm or involvement of the wall by an endometrioma as reported in history. IMPRESSION: Post hysterectomy. Unremarkable RIGHT ovary. Complex cystic mass LEFT ovary 3.3 cm diameter with imaging characteristics consistent with a hemorrhagic cyst. Posterior bladder wall mass 3.2 cm diameter with intraluminal extension, which could represent a bladder tumor or involvement of the bladder wall by an endometrioma as reported by history; recommend correlation with cystoscopy and tissue diagnosis. Electronically Signed   By: Ulyses Southward M.D.   On: 09/09/2019 14:03      Assessment and Plan:     1. Mass of urinary bladder determined by ultrasound - possible endometrioma  2. Status post total abdominal hysterectomy  3. History of endometriosis of the bladder - diagnosed by Urology via Cystoscopy with biopsies.  This was done in [place]. at [facility] after her hysterectomy because she continued to have pelvic pain and pain with urination.       ( records to be requested ) - referred to Urogynecology      I discussed the assessment and treatment plan with the patient. The patient was provided an opportunity to ask questions and all were answered. The patient agreed with the plan and demonstrated an understanding of the instructions.   The patient was advised to call back or seek an in-person evaluation/go to the ED if the symptoms worsen or if the condition fails to improve as anticipated.  I provided 15 minutes of non-face-to-face time during this encounter.   Coral Ceo, MD [facility], [facility] 09/14/19

## 2019-09-30 ENCOUNTER — Telehealth: Payer: Self-pay

## 2019-09-30 NOTE — Telephone Encounter (Signed)
S/w pt and advised that per Dr. Clearance Coots she is to follow up with the uro referral and they will decide next treatment steps, pt agreed,

## 2019-10-13 IMAGING — US TRANSVAGINAL ULTRASOUND OF PELVIS
1 series · 14 of 25 positions shown · non-contrast
Comparison: 12/20/2015

CLINICAL DATA: Vaginal bleeding

EXAM:
TRANSABDOMINAL AND TRANSVAGINAL ULTRASOUND OF PELVIS
TECHNIQUE: Both transabdominal and transvaginal ultrasound examinations of the
pelvis were performed. Transabdominal technique was performed for
global imaging of the pelvis including uterus, ovaries, adnexal
regions, and pelvic cul-de-sac. It was necessary to proceed with
endovaginal exam following the transabdominal exam to visualize the
ovaries and adnexa.

[Series 1: transvaginal ultrasound of pelvis · 14 of 33 slices shown]
[im 1/33]
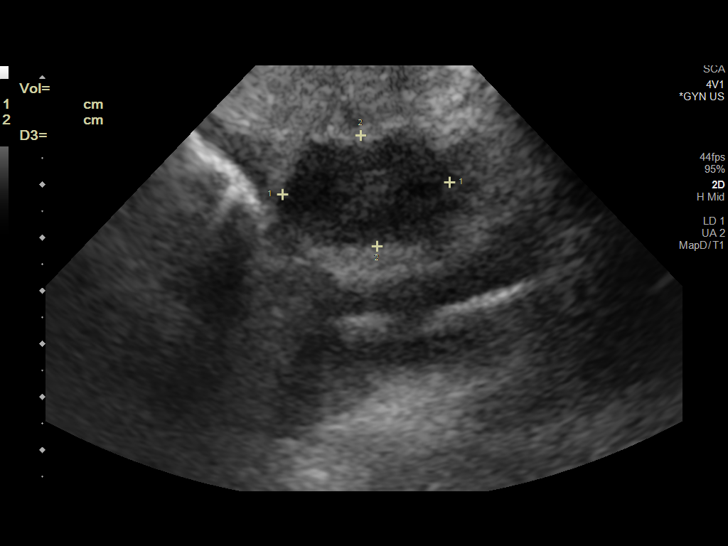
[im 3/33]
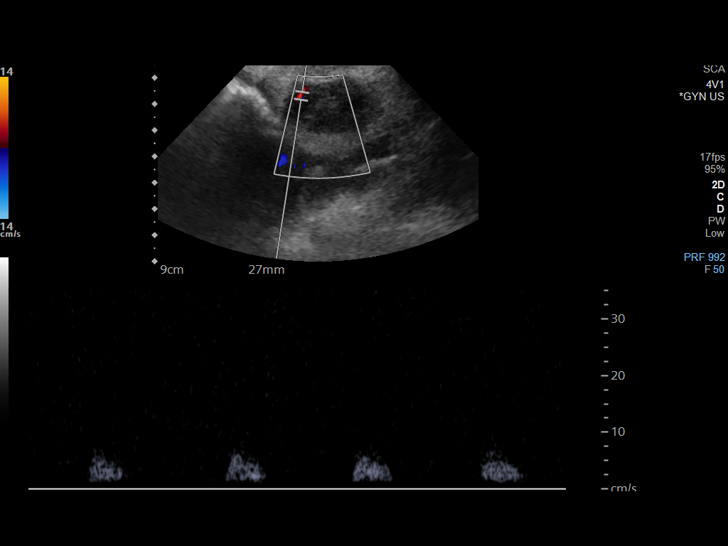
[im 6/33]
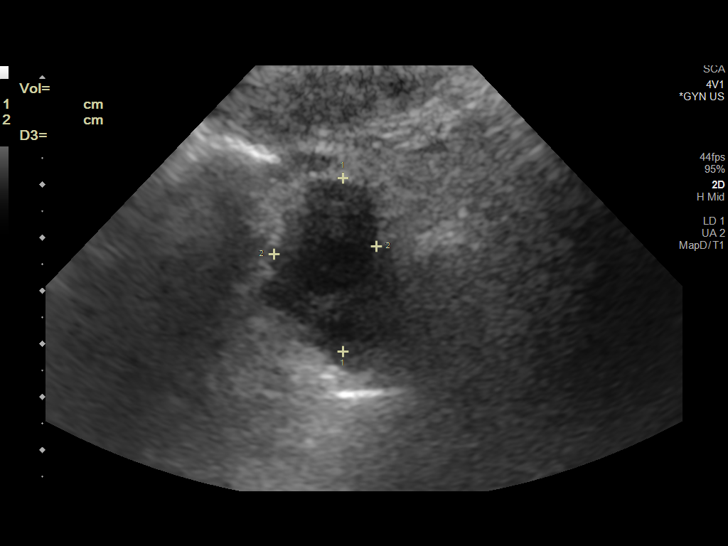
[im 9/33]
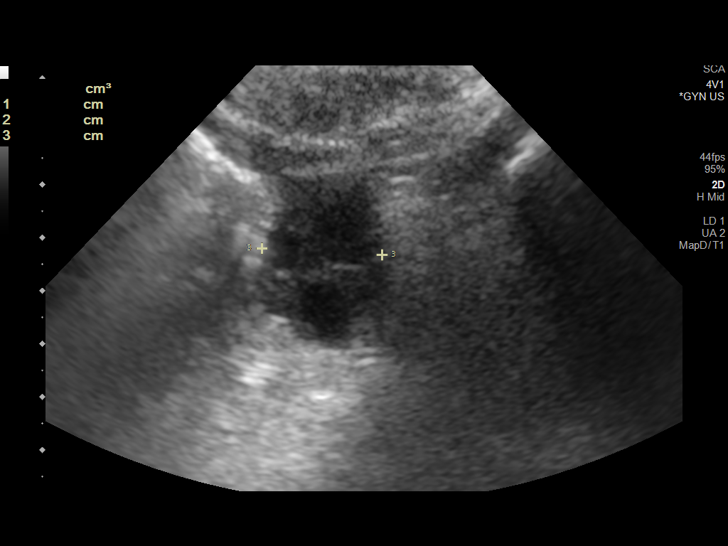
[im 11/33]
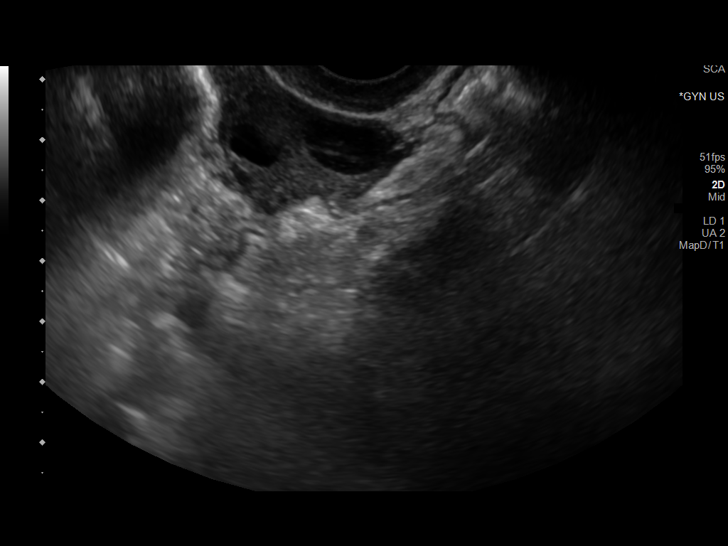
[im 13/33]
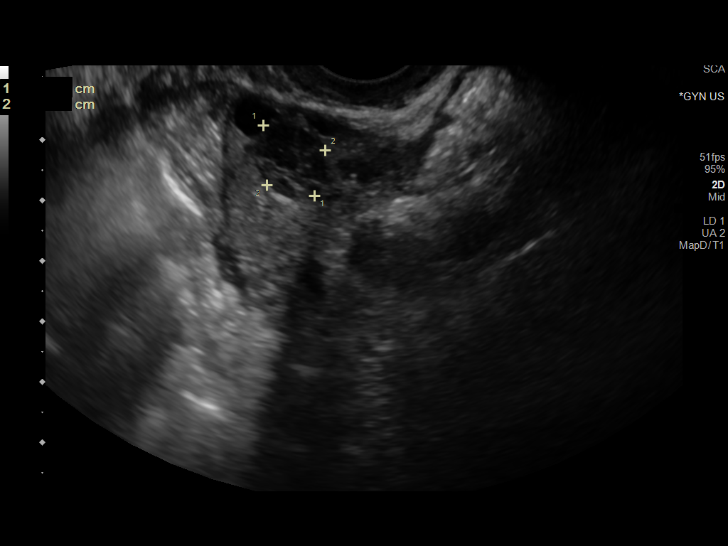
[im 15/33]
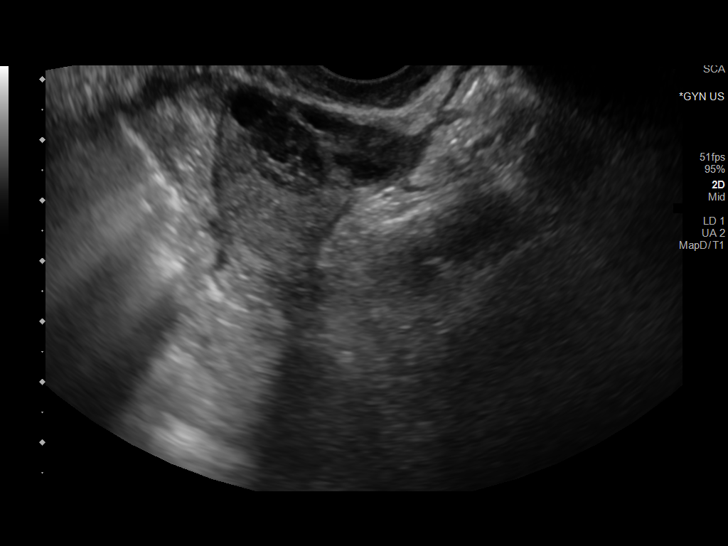
[im 18/33]
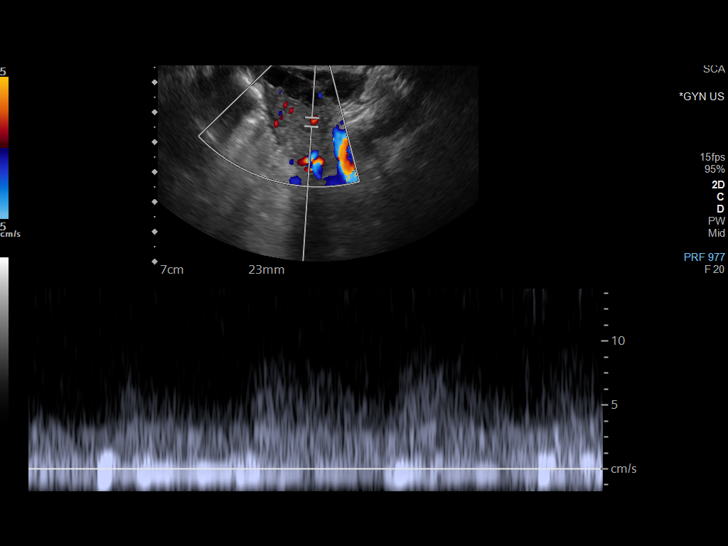
[im 21/33]
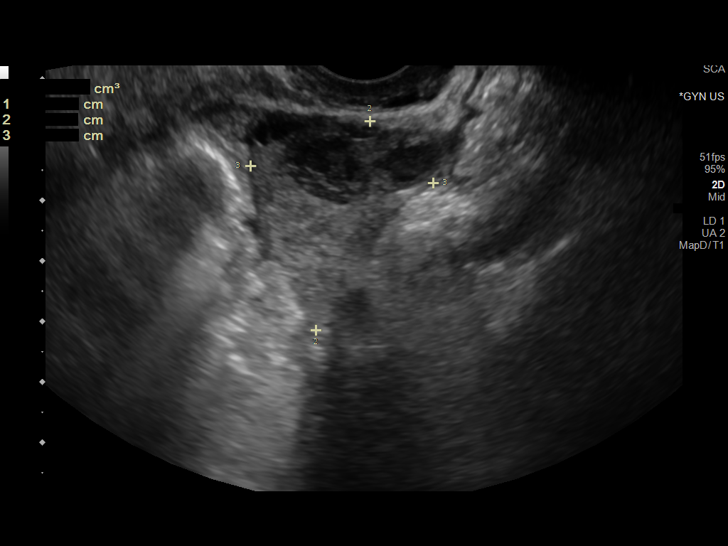
[im 22/33]
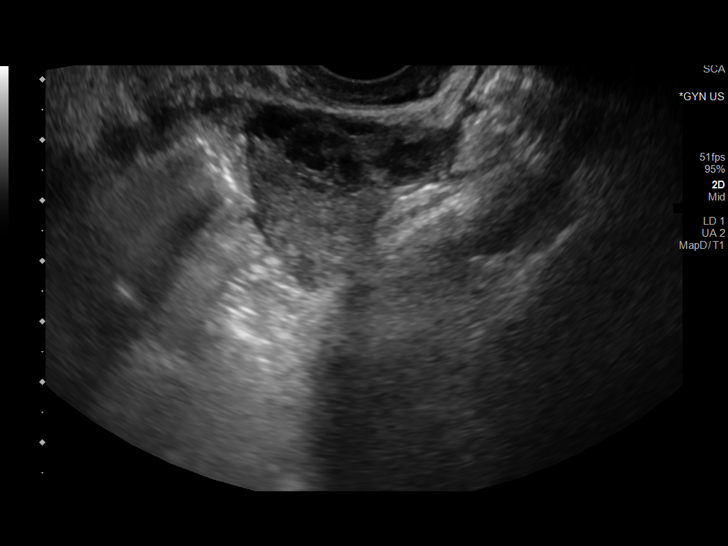
[im 25/33]
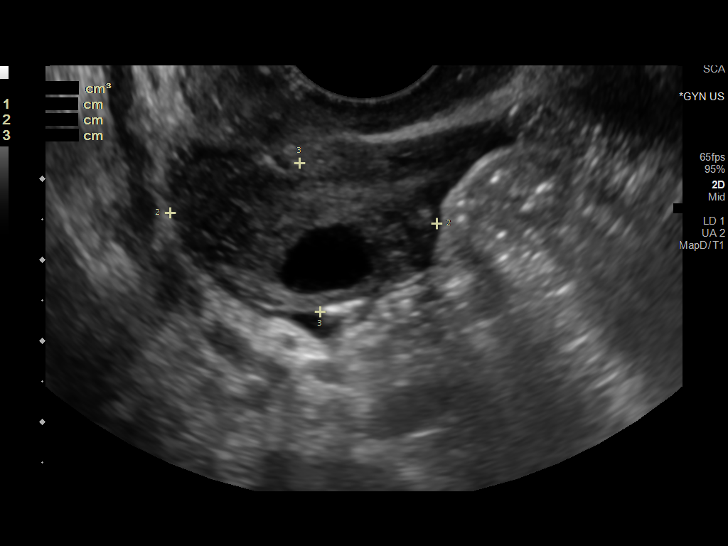
[im 27/33]
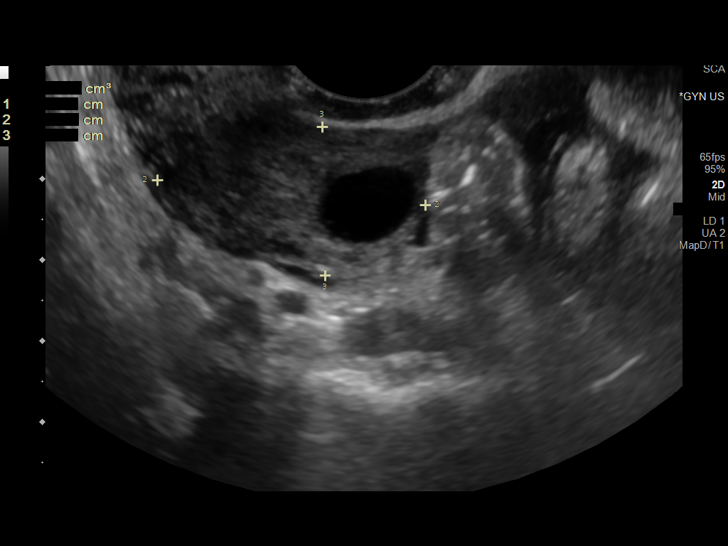
[im 30/33]
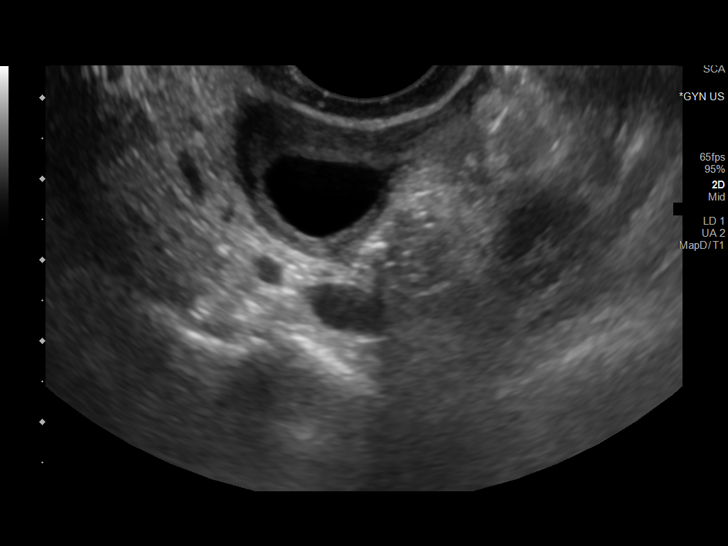
[im 33/33]
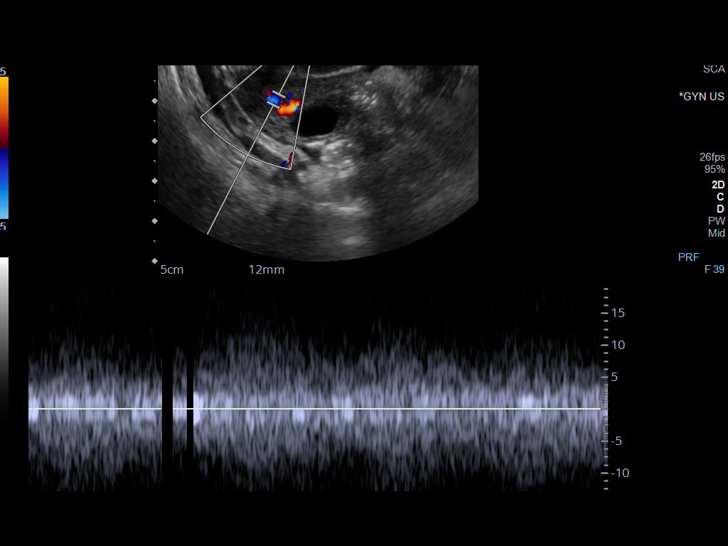

[14 of 25 positions shown; findings below may reference images not displayed]

FINDINGS: Uterus

Measurements: Prior hysterectomy

Endometrium

Thickness: Prior hysterectomy.

Right ovary

Measurements: 3.2 x 2.2 x 1.8 cm = volume: 7 mL. Normal
appearance/no adnexal mass. Small follicles.

Left ovary

Measurements: 2.2 x 3.5 x 3.0 cm = volume: 12.6 mL. Small follicles.
1.4 cm complex follicle or cyst.

Other findings

Small amount of free fluid.
IMPRESSION: Prior hysterectomy.

No ovarian/adnexal mass.  Small amount of free fluid.

## 2019-11-11 ENCOUNTER — Ambulatory Visit (INDEPENDENT_AMBULATORY_CARE_PROVIDER_SITE_OTHER): Payer: Self-pay | Admitting: Obstetrics and Gynecology

## 2019-11-11 ENCOUNTER — Other Ambulatory Visit: Payer: Self-pay

## 2019-11-11 ENCOUNTER — Other Ambulatory Visit: Payer: Self-pay | Admitting: Obstetrics and Gynecology

## 2019-11-11 ENCOUNTER — Encounter: Payer: Self-pay | Admitting: Obstetrics and Gynecology

## 2019-11-11 VITALS — BP 127/81 | Wt 114.0 lb

## 2019-11-11 DIAGNOSIS — N131 Hydronephrosis with ureteral stricture, not elsewhere classified: Secondary | ICD-10-CM

## 2019-11-11 DIAGNOSIS — N3289 Other specified disorders of bladder: Secondary | ICD-10-CM

## 2019-11-11 DIAGNOSIS — N3281 Overactive bladder: Secondary | ICD-10-CM

## 2019-11-11 DIAGNOSIS — R310 Gross hematuria: Secondary | ICD-10-CM

## 2019-11-11 DIAGNOSIS — R309 Painful micturition, unspecified: Secondary | ICD-10-CM

## 2019-11-11 LAB — POCT URINALYSIS DIPSTICK
Bilirubin, UA: NEGATIVE
Glucose, UA: NEGATIVE
Ketones, UA: NEGATIVE
Leukocytes, UA: NEGATIVE
Nitrite, UA: NEGATIVE
Odor: NEGATIVE
Protein, UA: NEGATIVE
Spec Grav, UA: 1.025 (ref 1.010–1.025)
Urobilinogen, UA: NEGATIVE E.U./dL — AB
pH, UA: 5 (ref 5.0–8.0)

## 2019-11-11 NOTE — Addendum Note (Signed)
Addended by: Richardson Chiquito on: 11/11/2019 04:35 PM   Modules accepted: Orders

## 2019-11-11 NOTE — Patient Instructions (Signed)
1. Schedule renal ultrasound  2. Once renal ultrasound is complete, I will call you to schedule the cystoscopy with bladder biopsy  3. Discuss medical management of endometriosis with your OBGYN (Dr Clearance Coots).

## 2019-11-11 NOTE — Progress Notes (Signed)
[place] [facility] New Patient Evaluation and Consultation  Referring Provider: Brock Bad, MD PCP: Patient, No Pcp Per Date of Service: 11/11/2019  SUBJECTIVE Chief Complaint: New Patient (Initial Visit) (Dr Clearance Coots referral )  History of Present Illness: Christine Brooks is a 39 y.o. Black or African-American female seen in consultation at the request of Dr. Clearance Coots for evaluation of bladder mass.    Review of records significant for: Patient has a history of endometriosis with TAH in 2019 (Dr Mikey Bussing that was complicated by post op bowel obstruction (required second surgery).   She was seen by [facility] for OAB and hematuria in 2020. Records in care everywhere report she had a cystoscopy, right ureteroscopy, dilation of the right distal ureteral stricture and TURBT on 05/19/18. Per office notes, pathology was positive for endometriosis with inflammatory response/ cystitis glandularis/ polypoid cystitis.   Repeat US was done by GYN for constant pelvic pain.  09/09/19 Pelvic US:  IMPRESSION: Post hysterectomy.  Unremarkable RIGHT ovary.  Complex cystic mass LEFT ovary 3.3 cm diameter with imaging characteristics consistent with a hemorrhagic cyst.  Posterior bladder wall mass 3.2 cm diameter with intraluminal extension, which could represent a bladder tumor or involvement of the bladder wall by an endometrioma as reported by history; recommend correlation with cystoscopy and tissue diagnosis.  Urinary Symptoms: Leaks urine with with a full bladder Leaks a few times a month Pad use: 3 liners/ mini-pads per day.   She is bothered by her UI symptoms.  Day time voids every 15 min.  Nocturia: several times per night to void. Voiding dysfunction: she does not empty her bladder well.  does not use a catheter to empty bladder.  When urinating, she feels the need to urinate multiple times in a row Drinks: 1 Flavored soda, plain water per day Tried Myrbetiq and  Oxybutynin for OAB. Myrbetiq helped but was not covered by insurance. The Oxybutynin caused severe constipation.   UTIs: 0 UTI's in the last year.   Denies history of bladder cancer. Reports history of gross blood in the urine for a few days per month. Had CT sone study last year which showed a 30mm stone at [facility].   Pelvic Organ Prolapse Symptoms:                  She Denies a feeling of a bulge the vaginal area.  Bowel Symptom: Bowel movements: a few time(s) per week Stool consistency: loose Straining: no.  Splinting: no.  Incomplete evacuation: no.  She Denies accidental bowel leakage / fecal incontinence Bowel regimen: none Last colonoscopy: n/a  Sexual Function Sexually active: no.  Pain with sex: No  Pelvic Pain Admits to pelvic pain Location: lower abdomen and left side  Pain occurs: when urinating Prior pain treatment: advil Improved by: advil Worsened by: nothing Has noticed some ? Vaginal spotting.   Was prescribed OCPs by [facility]BGYN in [facility] but never took it.   Past Medical History:  Past Medical History:  Diagnosis Date  . Bowel obstruction (HCC)   . Endometriosis   . ATFTDDUK(025.4)      Past Surgical History:   Past Surgical History:  Procedure Laterality Date  . CESAREAN SECTION    . DILATION AND CURETTAGE OF UTERUS    . LAPAROSCOPY    . PARTIAL HYSTERECTOMY       Past OB/GYN History: G3 P2 Vaginal deliveries: 0,  Forceps/ Vacuum deliveries: 0, Cesarean section: 2 Menopausal: No, s/p hyst Contraception: none.  Last pap smear was prior to hyst.  Any history of abnormal pap smears: no.   Medications: She has a current medication list which includes the following prescription(s): acetaminophen, fluticasone, ibuprofen, and cepacol sore throat.   Allergies: Patient has No Known Allergies.   Social History:  Social History   Tobacco Use  . Smoking status: Former Smoker    Years: 1.00    Types: Cigarettes  . Smokeless tobacco: Never Used   Vaping Use  . Vaping Use: Never used  Substance Use Topics  . Alcohol use: Not Currently    Alcohol/week: 0.0 standard drinks    Comment: rare  . Drug use: No    Relationship status: single She lives with a friend.   She is not employed. Regular exercise: Yes: occasional History of abuse: No  Family History:   Family History  Problem Relation Age of Onset  . Healthy Mother   . Ataxia Neg Hx   . Chorea Neg Hx   . Dementia Neg Hx   . Mental retardation Neg Hx   . Migraines Neg Hx   . Multiple sclerosis Neg Hx   . Neurofibromatosis Neg Hx   . Neuropathy Neg Hx   . Parkinsonism Neg Hx   . Seizures Neg Hx   . Stroke Neg Hx      Review of Systems: Review of Systems  Constitutional: Negative for fever, malaise/fatigue and weight loss.  Respiratory: Negative for cough, shortness of breath and wheezing.   Cardiovascular: Negative for chest pain and palpitations.  Gastrointestinal: Negative for abdominal pain and blood in stool.  Musculoskeletal: Negative for myalgias.  Skin: Negative for rash.  Neurological: Negative for dizziness and headaches.  Endo/Heme/Allergies: Does not bruise/bleed easily.  Psychiatric/Behavioral: Negative for depression. The patient is not nervous/anxious.      OBJECTIVE Physical Exam: Vitals:   11/11/19 1215  BP: 127/81  Weight: 114 lb (51.7 kg)    Physical Exam Constitutional:      General: She is not in acute distress. Pulmonary:     Effort: Pulmonary effort is normal.  Abdominal:     General: There is no distension.     Palpations: Abdomen is soft.     Tenderness: There is no abdominal tenderness. There is no rebound.  Musculoskeletal:        General: No swelling. Normal range of motion.  Skin:    General: Skin is warm and dry.     Findings: No rash.  Neurological:     Mental Status: She is alert and oriented to person, place, and time.  Psychiatric:        Mood and Affect: Mood normal.        Behavior: Behavior normal.       GU / Detailed Urogynecologic Evaluation:  Pelvic Exam: Normal external female genitalia; Bartholin's and Skene's glands normal in appearance; urethral meatus normal in appearance, no urethral masses or discharge.   s/p hysterectomy: Speculum exam reveals normal vaginal mucosa without  atrophy and normal vaginal cuff.  Adnexa 4cm palpable mass in mid to left suprapubic area. Tender to palpation. Unclear is this is bladder or adnexa. .     Pelvic floor musculature: Right levator non-tender, Right obturator non-tender, Left levator non-tender, Left obturator non-tender  POP-Q:  Not performed, no prolapse   Rectal Exam:  deferred  Post-Void Residual (PVR) by Bladder Scan: In order to evaluate bladder emptying, we discussed obtaining a postvoid residual and she agreed to this procedure.  Procedure: The ultrasound  unit was placed on the patient's abdomen in the suprapubic region after the patient had voided. A PVR of 0 ml was obtained by bladder scan.  Laboratory Results: POC urine: 3+ blood  I visualized the urine specimen, noting the specimen to be dark yellow    I personally reviewed images from the Pelvic US on 09/09/19, which show a 3.2cm posterior wall bladder mass that appears to invade into the bladder lumen. Hemorrhagic left ovarian cyst is also visualized.   ASSESSMENT AND PLAN Ms. Lauderbaugh is a 39 y.o. with:  1. Bladder mass   2. Pain with urination   3. Gross hematuria   4. Hydronephrosis with ureteral stricture, not elsewhere classified   5. Overactive bladder      1. Bladder mass/ hx of ureteral stricture/ gross hematuria - Most likely recurrent endometriosis based on her history and she has not been on suppressive therapy. However, since it has been over a year since her last cysto, we discussed risks/ benefits of repeat cystoscopy with biopsy.  - Per [facility] office records, last cystoscopy required ureteral dilation for distal obstruction. Unfortunately, I do not  have the operative notes, so it is unclear if the endometriosis was the cause of obstruction. Therefore, will proceed with Renal Ultrasound to evaluate for hydronephrosis. If this is the case, will need to refer to urology for further management.  - Discussed that if the biopsy confirms endo, then medical management with hormonal suppressive therapy would be first line for treatment. If suppressive therapy fails to control her symptoms, then would consider resection. Most of the mass is extraluminal so this would require laparoscopic/ robotic resection.  - symptoms of gross hematuria would be explained by endometriosis  2. Overactive bladder - We discussed the symptoms of overactive bladder (OAB), which include urinary urgency, urinary frequency, nocturia, with or without urge incontinence.  While we do not know the exact etiology of OAB, several treatment options exist. We discussed management including behavioral therapy (decreasing bladder irritants, urge suppression strategies, timed voids, bladder retraining), physical therapy, medication; for refractory cases posterior tibial nerve stimulation, sacral neuromodulation, and intravesical botulinum toxin injection.  - Symptoms are likely exacerbated by bladder mass.  - Has tried myrbetriq and oxybutynin in the past so would be a candidate for third line therapy. We briefly discussed options but will decide on further management after we confirm diagnosis of bladder mass. She may be a good candidate for SNM as this can also be used for pelvic pain.    3. Pain with urination - likely related to bladder endometriosis - POC with + blood, will send for urine culture to r/o infection    Christine Beards, MD   Medical Decision Making:   - Reviewed/ ordered a clinical laboratory test - Reviewed/ ordered a radiologic study - Review and summation of prior records - Independent review of ultrasound image, and urine specimen

## 2019-11-12 NOTE — Progress Notes (Signed)
I reviewed the Uro gynecologist's note and agree with the plan of care.  Thank-you for your assistance.  Brock Bad, MD 04/12/2017 10:46 AM

## 2019-11-15 LAB — URINE CULTURE

## 2019-11-17 ENCOUNTER — Telehealth: Payer: Self-pay | Admitting: *Deleted

## 2019-11-17 NOTE — Telephone Encounter (Signed)
Pt informed of Renal US apt 11/10 at 230pm arrive at 215pm at [facility].  Pt agreed to go to apt. KW CMA

## 2019-11-25 ENCOUNTER — Other Ambulatory Visit: Payer: Self-pay

## 2019-11-25 ENCOUNTER — Ambulatory Visit (HOSPITAL_COMMUNITY)
Admission: RE | Admit: 2019-11-25 | Discharge: 2019-11-25 | Disposition: A | Discharge status: Home / Self Care | Payer: Self-pay | Source: Ambulatory Visit | Attending: Obstetrics and Gynecology | Admitting: Obstetrics and Gynecology

## 2019-11-25 DIAGNOSIS — N131 Hydronephrosis with ureteral stricture, not elsewhere classified: Secondary | ICD-10-CM | POA: Insufficient documentation

## 2019-11-25 DIAGNOSIS — R310 Gross hematuria: Secondary | ICD-10-CM | POA: Insufficient documentation

## 2019-11-26 ENCOUNTER — Other Ambulatory Visit: Payer: Self-pay | Admitting: Obstetrics and Gynecology

## 2019-11-26 ENCOUNTER — Encounter: Payer: Self-pay | Admitting: Obstetrics and Gynecology

## 2019-11-26 DIAGNOSIS — N3289 Other specified disorders of bladder: Secondary | ICD-10-CM

## 2019-12-03 NOTE — Progress Notes (Signed)
Error

## 2020-01-27 ENCOUNTER — Telehealth (INDEPENDENT_AMBULATORY_CARE_PROVIDER_SITE_OTHER): Payer: Self-pay | Admitting: Obstetrics

## 2020-01-27 ENCOUNTER — Encounter: Payer: Self-pay | Admitting: Obstetrics

## 2020-01-27 DIAGNOSIS — R102 Pelvic and perineal pain: Secondary | ICD-10-CM

## 2020-01-27 DIAGNOSIS — Z8742 Personal history of other diseases of the female genital tract: Secondary | ICD-10-CM

## 2020-01-27 DIAGNOSIS — N3289 Other specified disorders of bladder: Secondary | ICD-10-CM

## 2020-01-27 DIAGNOSIS — Z90710 Acquired absence of both cervix and uterus: Secondary | ICD-10-CM

## 2020-01-27 DIAGNOSIS — N83202 Unspecified ovarian cyst, left side: Secondary | ICD-10-CM

## 2020-01-27 MED ORDER — OXYCODONE-ACETAMINOPHEN 5-325 MG PO TABS: 1.0000 | ORAL_TABLET | Freq: Four times a day (QID) | ORAL | 0 refills | Status: DC | PRN

## 2020-01-27 NOTE — Progress Notes (Signed)
GYNECOLOGY VIRTUAL VISIT ENCOUNTER NOTE  Provider location: Center for [facility] [place]   I connected with [name] on 01/27/20 at  3:00 PM EST by MyChart Video Encounter at home and verified that I am speaking with the correct person using two identifiers.   I discussed the limitations, risks, security and privacy concerns of performing an evaluation and management service virtually and the availability of in person appointments. I also discussed with the patient that there may be a patient responsible charge related to this service. The patient expressed understanding and agreed to proceed.   History:  Christine Brooks is a 40 y.o. [id number] female being evaluated today for pelvic pain.  Ultrasound revealed a posterior urinary bladder mass that may be c/w an endometrioma and also a 6 mm bladder nodule in upper anterior bladder that will need cystoscopic evaluation. She has been referred to urogynecologist for consultation and further management.She denies any abnormal vaginal discharge, bleeding, or other concerns.       Past Medical History:  Diagnosis Date  . Bowel obstruction (HCC)   . Endometriosis   . TKZSWFUX(323.5)    Past Surgical History:  Procedure Laterality Date  . CESAREAN SECTION    . DILATION AND CURETTAGE OF UTERUS    . LAPAROSCOPY    . PARTIAL HYSTERECTOMY     The following portions of the patient's history were reviewed and updated as appropriate: allergies, current medications, past family history, past medical history, past social history, past surgical history and problem list.   Health Maintenance:  Normal pap and negative HRHPV on 12-01-2015.    Review of Systems:  Pertinent items noted in HPI and remainder of comprehensive ROS otherwise negative.  Physical Exam:   General:  Alert, oriented and cooperative. Patient appears to be in no acute distress.  Mental Status: Normal mood and affect. Normal behavior. Normal judgment and thought  content.   Respiratory: Normal respiratory effort, no problems with respiration noted  Rest of physical exam deferred due to type of encounter  Labs and Imaging    US RENAL (Accession [id number]) (Order [id number]) Imaging Date: 11/25/2019 Department: [facility] ULTRASOUND Released By: Abelina Bachelor Authorizing: Marguerita Beards, MD    Exam Status  Status  Final [99]   PACS Intelerad Image Link  Show images for US RENAL  Study Result  Narrative & Impression  CLINICAL DATA:  Gross hematuria, known bladder mass, bladder endometriosis  EXAM: RENAL / URINARY TRACT ULTRASOUND COMPLETE  COMPARISON:  CT abdomen and pelvis 05/17/2018,  FINDINGS: Right Kidney:  Renal measurements: 10.9 x 4.7 x 5.0 cm = volume: 134 mL. Normal cortical thickness and echogenicity. No mass, hydronephrosis or shadowing calcification.  Left Kidney:  Renal measurements: 10.0 x 4.7 x 4.8 cm = volume: 119 mL. Normal cortical thickness and echogenicity. No mass, hydronephrosis or shadowing calcification.  Bladder:  Normally distended. BILATERAL renal jets present. Questionable small nodule 6 mm diameter versus artifact and anterior upper bladder. No other potential masses identified.  Other:  None.  IMPRESSION: Normal sonographic appearance of the kidneys.  Questionable 6 mm bladder nodule versus artifact at the anterior upper bladder, recommend correlation with cystoscopy to exclude tumor.   Electronically Signed   By: Ulyses Southward M.D.   On: 11/26/2019 09:15        Assessment and Plan:     1. Mass of urinary bladder determined by ultrasound - currently being evaluated by urogynecologist  2. Status post  total abdominal hysterectomy  3. History of endometriosis  4. Cyst of left ovary - small, hemorrhagic appearing on ultrasound - will probably resolve spontaneously - will follow clinically  5. Pelvic pain, unrelieved with  Ibuprofen Rx: - oxyCODONE-acetaminophen (PERCOCET/ROXICET) 5-325 MG tablet; Take 1-2 tablets by mouth every 6 (six) hours as needed for severe pain.  Dispense: 20 tablet; Refill: 0       I discussed the assessment and treatment plan with the patient. The patient was provided an opportunity to ask questions and all were answered. The patient agreed with the plan and demonstrated an understanding of the instructions.   The patient was advised to call back or seek an in-person evaluation/go to the ED if the symptoms worsen or if the condition fails to improve as anticipated.  I provided 15 minutes of face-to-face time during this encounter.   Coral Ceo, MD Center for [facility], [facility] 01/27/20

## 2020-01-27 NOTE — Progress Notes (Signed)
Patient presents for follow ovarian cyst. Patient has no other concerns.

## 2020-02-24 ENCOUNTER — Encounter: Payer: Self-pay | Admitting: Obstetrics and Gynecology

## 2020-02-24 ENCOUNTER — Telehealth: Payer: Self-pay | Admitting: Obstetrics and Gynecology

## 2020-02-24 DIAGNOSIS — N809 Endometriosis, unspecified: Secondary | ICD-10-CM

## 2020-02-24 DIAGNOSIS — N83209 Unspecified ovarian cyst, unspecified side: Secondary | ICD-10-CM

## 2020-02-24 NOTE — Telephone Encounter (Signed)
Patient called stating she has been having pain and was concerned that she needed surgery for her cyst. Had an appointment with Gyn last month but did not have a repeat ultrasound scheduled. She has not started any suppressive therapy for endometriosis.   Clarified about her situation with insurance as this has been the delaying factor for her scheduling cystoscopy. She is currently trying to get insurance but was denied Medicaid. She states the financial counselor stated she should have the procedure done first and then can get assistance with paying the bill from [facility].  Ordered a pelvic ultrasound to follow up on the cyst. We discussed that we would await the ultrasound results. Will have her schedule an appt with GYN. If she requires a surgery for her cyst, then we can potentially perform the cystoscopy and other procedure a the same time to help save on costs.   Patient expressed understanding. She will contact us if she has difficulty scheduling.   Marguerita Beards, MD

## 2020-03-08 ENCOUNTER — Other Ambulatory Visit: Payer: Self-pay

## 2020-03-08 ENCOUNTER — Ambulatory Visit
Admission: RE | Admit: 2020-03-08 | Discharge: 2020-03-08 | Disposition: A | Discharge status: Home / Self Care | Payer: Self-pay | Source: Ambulatory Visit | Attending: Obstetrics and Gynecology | Admitting: Obstetrics and Gynecology

## 2020-03-08 DIAGNOSIS — N83209 Unspecified ovarian cyst, unspecified side: Secondary | ICD-10-CM | POA: Insufficient documentation

## 2020-03-15 ENCOUNTER — Encounter (HOSPITAL_BASED_OUTPATIENT_CLINIC_OR_DEPARTMENT_OTHER): Payer: Self-pay | Admitting: Obstetrics and Gynecology

## 2020-03-15 ENCOUNTER — Telehealth: Payer: Self-pay | Admitting: Obstetrics and Gynecology

## 2020-03-15 ENCOUNTER — Encounter: Payer: Self-pay | Admitting: Obstetrics and Gynecology

## 2020-03-15 ENCOUNTER — Telehealth: Payer: Self-pay | Admitting: *Deleted

## 2020-03-15 NOTE — Telephone Encounter (Signed)
DOB verified. Informed pt that cysto with biopsy had been scheduled at [facility] on 03/21/20 at 10am. Advised that she would receive a phone call from the OR nurse a few days prior to with further instructions. Advised that her arrival time would be around 830. Advised that covid testing appt was made for 03/17/20 @ 250 and that she should quarantine after the test until the procedure is done. Advised to stop taking aspirin, nsaids, herbal supplements. Advised she could take Tylenol if needed. Advised that a virtual pre op appt was scheduled for 03/18/20 @ 330 and a post op for 04/05/20@ 1130.  Advised pt to call back with any other concerns or questions. Pt verbalized understanding.

## 2020-03-15 NOTE — Telephone Encounter (Signed)
Called patient to discuss results of ultrasound. Hemorrhagic cyst size has decreased. Bladder mass has mild enlargement since August. Therefore, will plan for biopsy in the operating room to confirm diagnosis of endometriosis. She has an appointment with GYN next week as well and we can discuss co-management once diagnosis is confirmed.   Will call patient to confirm OR date.   Marguerita Beards, MD

## 2020-03-16 ENCOUNTER — Encounter (HOSPITAL_BASED_OUTPATIENT_CLINIC_OR_DEPARTMENT_OTHER): Payer: Self-pay | Admitting: Obstetrics and Gynecology

## 2020-03-16 ENCOUNTER — Other Ambulatory Visit: Payer: Self-pay

## 2020-03-16 NOTE — Progress Notes (Signed)
Spoke w/ via phone for pre-op interview--- PT Lab needs dos---- no              Lab results------ no COVID test ------ 03-17-2020 @ 1450 Arrive at ------- 0800 on 03-21-2020 NPO after MN NO Solid Food.  Clear liquids from MN until--- 0700 Medications to take morning of surgery ----- NONE Diabetic medication ----- n/a Patient Special Instructions ----- n/a Pre-Op special Istructions ----- n/a Patient verbalized understanding of instructions that were given at this phone interview. Patient denies shortness of breath, chest pain, fever, cough at this phone interview.

## 2020-03-17 ENCOUNTER — Inpatient Hospital Stay (HOSPITAL_COMMUNITY): Admission: RE | Admit: 2020-03-17 | Payer: Self-pay | Source: Ambulatory Visit

## 2020-03-17 ENCOUNTER — Other Ambulatory Visit (HOSPITAL_COMMUNITY)
Admission: RE | Admit: 2020-03-17 | Discharge: 2020-03-17 | Disposition: A | Discharge status: Home / Self Care | Payer: HRSA Program | Source: Ambulatory Visit | Attending: Obstetrics and Gynecology | Admitting: Obstetrics and Gynecology

## 2020-03-17 DIAGNOSIS — Z01812 Encounter for preprocedural laboratory examination: Secondary | ICD-10-CM | POA: Insufficient documentation

## 2020-03-17 DIAGNOSIS — Z20822 Contact with and (suspected) exposure to covid-19: Secondary | ICD-10-CM | POA: Diagnosis not present

## 2020-03-17 NOTE — H&P (View-Only) (Signed)
[place] [facility] Pre-Operative visit- Video visit  This was a video visit. Patient was located at home in [place].   Subjective Chief Complaint: Christine Brooks presents for a preoperative encounter.   History of Present Illness: TIFFIANY Brooks is a 40 y.o. female who presents for preoperative visit.  She is scheduled to undergo cystoscopy with biopsy on 03/21/20.  Her symptoms include pelvic pain and endometriosis.   She had a recent pelvic ultrasound which showed:  Post hysterectomy with diminished size of hemorrhagic cyst of the LEFT ovary versus is formation of corpus luteum in this location since prior imaging.  Bladder mass with involvement of the posterior urinary bladder in a transmural fashion. Potentially mildly enlarged since August of 2021; by report the patient has bladder involvement from endometriosis which could certainly have this appearance. By ultrasound bladder neoplasm/urothelial carcinoma could have a similar appearance.    Past Medical History:  Diagnosis Date  . Bladder mass   . Dysuria   . Endometriosis   . Endometriosis, bladder   . Hematuria   . History of 2019 novel coronavirus disease (COVID-19) 08/24/2019   positive covid test result in epic, per pt mild symptoms that resolved  . History of small bowel obstruction 02/2017   2019 post op TAH  w/ surgical bowel resection  . Lower urinary tract symptoms (LUTS)   . Nephrolithiasis    per pt nonobstructive     Past Surgical History:  Procedure Laterality Date  . CESAREAN SECTION  1999;  07/ 2009  . CYSTOSCOPY WITH URETHRAL DILATATION Right 05/2018   W/  TURBT   (right ureteral stricture)  . DIAGNOSTIC LAPAROSCOPY  12/2016  . DILATION AND CURETTAGE OF UTERUS  yrs ago  . HEMICOLECTOMY  02/2017   fews days post op TAH for bowel obstruction  . TOTAL ABDOMINAL HYSTERECTOMY  02/2017    has No Known Allergies.   Family History  Problem Relation Age of Onset  . Healthy Mother   . Ataxia Neg  Hx   . Chorea Neg Hx   . Dementia Neg Hx   . Mental retardation Neg Hx   . Migraines Neg Hx   . Multiple sclerosis Neg Hx   . Neurofibromatosis Neg Hx   . Neuropathy Neg Hx   . Parkinsonism Neg Hx   . Seizures Neg Hx   . Stroke Neg Hx     Social History   Tobacco Use  . Smoking status: Former Smoker    Years: 2.00    Types: Cigarettes    Quit date: 03/17/2018    Years since quitting: 2.0  . Smokeless tobacco: Never Used  Vaping Use  . Vaping Use: Never used  Substance Use Topics  . Alcohol use: Not Currently    Alcohol/week: 0.0 standard drinks    Comment: rare  . Drug use: Never     Review of Systems was negative for a full 10 system review except as noted in the History of Present Illness.   Current Outpatient Medications:  .  Biotin w/ Vitamins C & E (HAIR/SKIN/NAILS) 1250-7.5-7.5 MCG-MG-UNT CHEW, Chew by mouth daily., Disp: , Rfl:  .  ibuprofen (ADVIL) 800 MG tablet, Take 1 tablet (800 mg total) by mouth every 8 (eight) hours as needed., Disp: 30 tablet, Rfl: 5 .  oxyCODONE-acetaminophen (PERCOCET/ROXICET) 5-325 MG tablet, Take 1-2 tablets by mouth every 6 (six) hours as needed for severe pain., Disp: 20 tablet, Rfl: 0   Objective Gen: No distress  Previous  Pelvic Exam showed: Pelvic Exam: Normal external female genitalia; Bartholin's and Skene's glands normal in appearance; urethral meatus normal in appearance, no urethral masses or discharge.   s/p hysterectomy: Speculum exam reveals normal vaginal mucosa without  atrophy and normal vaginal cuff.  Adnexa 4cm palpable mass in mid to left suprapubic area. Tender to palpation. Unclear is this is bladder or adnexa. .      Assessment/ Plan  Assessment: The patient is a 39 y.o. year old scheduled to undergo cystoscopy with biopsy. Verbal consent was obtained for these procedures.  Plan: General Surgical Consent: The patient has previously been counseled on alternative treatments, and the decision by the  patient and provider was to proceed with the procedure listed above. We reviewed that this procedure is to help with future planning and diagnosis of bladder mass, and that she will likely require another surgery in the future for resection of the mass as it is transmural on ultrasound.   For all procedures, there are risks of bleeding, infection, damage to surrounding organs including but not limited to bowel, bladder, blood vessels, ureters and nerves, and need for further surgery if an injury were to occur. These risks are all low with minimally invasive surgery.   Pre-operative instructions:  She was instructed to not take Aspirin/NSAIDs x 7days prior to surgery.  Antibiotic prophylaxis was ordered as indicated.  Post-operative instructions:  She was provided with specific post-operative instructions, including precautions and signs/symptoms for which we would recommend contacting us, in addition to daytime and after-hours contact phone numbers. This was provided on a handout.   Post-operative medications: Will provide prescription on day of surgery.   Laboratory testing:  We will check day of surgery UPT. COVID test returned negative.   Preoperative clearance:  She does not require surgical clearance.    Post-operative follow-up:  A post-operative appointment will be made for 2 weeks from the date of surgery. Patient will call the clinic or use MyChart should anything change or any new issues arise.   Lorelle Macaluso N Tamirra Sienkiewicz, MD    

## 2020-03-17 NOTE — Progress Notes (Signed)
[facility] Pre-Operative visit- Video visit  This was a video visit. Patient was located at home in [place].   Subjective Chief Complaint: Christine Brooks presents for a preoperative encounter.   History of Present Illness: Christine Brooks is a 40 y.o. female who presents for preoperative visit.  She is scheduled to undergo cystoscopy with biopsy on 03/21/20.  Her symptoms include pelvic pain and endometriosis.   She had a recent pelvic ultrasound which showed:  Post hysterectomy with diminished size of hemorrhagic cyst of the LEFT ovary versus is formation of corpus luteum in this location since prior imaging.  Bladder mass with involvement of the posterior urinary bladder in a transmural fashion. Potentially mildly enlarged since August of 2021; by report the patient has bladder involvement from endometriosis which could certainly have this appearance. By ultrasound bladder neoplasm/urothelial carcinoma could have a similar appearance.    Past Medical History:  Diagnosis Date  . Bladder mass   . Dysuria   . Endometriosis   . Endometriosis, bladder   . Hematuria   . History of 2019 novel coronavirus disease (COVID-19) 08/24/2019   positive covid test result in epic, per pt mild symptoms that resolved  . History of small bowel obstruction 02/2017   2019 post op TAH  w/ surgical bowel resection  . Lower urinary tract symptoms (LUTS)   . Nephrolithiasis    per pt nonobstructive     Past Surgical History:  Procedure Laterality Date  . CESAREAN SECTION  1999;  07/ 2009  . CYSTOSCOPY WITH URETHRAL DILATATION Right 05/2018   W/  TURBT   (right ureteral stricture)  . DIAGNOSTIC LAPAROSCOPY  12/2016  . DILATION AND CURETTAGE OF UTERUS  yrs ago  . HEMICOLECTOMY  02/2017   fews days post op TAH for bowel obstruction  . TOTAL ABDOMINAL HYSTERECTOMY  02/2017    has No Known Allergies.   Family History  Problem Relation Age of Onset  . Healthy Mother   . Ataxia Neg  Hx   . Chorea Neg Hx   . Dementia Neg Hx   . Mental retardation Neg Hx   . Migraines Neg Hx   . Multiple sclerosis Neg Hx   . Neurofibromatosis Neg Hx   . Neuropathy Neg Hx   . Parkinsonism Neg Hx   . Seizures Neg Hx   . Stroke Neg Hx     Social History   Tobacco Use  . Smoking status: Former Smoker    Years: 2.00    Types: Cigarettes    Quit date: 03/17/2018    Years since quitting: 2.0  . Smokeless tobacco: Never Used  Vaping Use  . Vaping Use: Never used  Substance Use Topics  . Alcohol use: Not Currently    Alcohol/week: 0.0 standard drinks    Comment: rare  . Drug use: Never     Review of Systems was negative for a full 10 system review except as noted in the History of Present Illness.   Current Outpatient Medications:  .  Biotin w/ Vitamins C & E (HAIR/SKIN/NAILS) 1250-7.5-7.5 MCG-MG-UNT CHEW, Chew by mouth daily., Disp: , Rfl:  .  ibuprofen (ADVIL) 800 MG tablet, Take 1 tablet (800 mg total) by mouth every 8 (eight) hours as needed., Disp: 30 tablet, Rfl: 5 .  oxyCODONE-acetaminophen (PERCOCET/ROXICET) 5-325 MG tablet, Take 1-2 tablets by mouth every 6 (six) hours as needed for severe pain., Disp: 20 tablet, Rfl: 0   Objective Gen: No distress  Previous  Pelvic Exam showed: Pelvic Exam: Normal external female genitalia; Bartholin's and Skene's glands normal in appearance; urethral meatus normal in appearance, no urethral masses or discharge.   s/p hysterectomy: Speculum exam reveals normal vaginal mucosa without  atrophy and normal vaginal cuff.  Adnexa 4cm palpable mass in mid to left suprapubic area. Tender to palpation. Unclear is this is bladder or adnexa. .      Assessment/ Plan  Assessment: The patient is a 40 y.o. year old scheduled to undergo cystoscopy with biopsy. Verbal consent was obtained for these procedures.  Plan: General Surgical Consent: The patient has previously been counseled on alternative treatments, and the decision by the  patient and provider was to proceed with the procedure listed above. We reviewed that this procedure is to help with future planning and diagnosis of bladder mass, and that she will likely require another surgery in the future for resection of the mass as it is transmural on ultrasound.   For all procedures, there are risks of bleeding, infection, damage to surrounding organs including but not limited to bowel, bladder, blood vessels, ureters and nerves, and need for further surgery if an injury were to occur. These risks are all low with minimally invasive surgery.   Pre-operative instructions:  She was instructed to not take Aspirin/NSAIDs x 7days prior to surgery.  Antibiotic prophylaxis was ordered as indicated.  Post-operative instructions:  She was provided with specific post-operative instructions, including precautions and signs/symptoms for which we would recommend contacting us, in addition to daytime and after-hours contact phone numbers. This was provided on a handout.   Post-operative medications: Will provide prescription on day of surgery.   Laboratory testing:  We will check day of surgery UPT. COVID test returned negative.   Preoperative clearance:  She does not require surgical clearance.    Post-operative follow-up:  A post-operative appointment will be made for 2 weeks from the date of surgery. Patient will call the clinic or use MyChart should anything change or any new issues arise.   Marguerita Beards, MD

## 2020-03-18 ENCOUNTER — Telehealth (INDEPENDENT_AMBULATORY_CARE_PROVIDER_SITE_OTHER): Payer: Self-pay | Admitting: Obstetrics and Gynecology

## 2020-03-18 DIAGNOSIS — N3289 Other specified disorders of bladder: Secondary | ICD-10-CM

## 2020-03-18 LAB — SARS CORONAVIRUS 2 (TAT 6-24 HRS): SARS Coronavirus 2: NEGATIVE

## 2020-03-18 NOTE — Patient Instructions (Signed)
Taking Care of Yourself after Urodynamics, Cystoscopy, Coaptite Injection, or Botox Injection    .   Drink plenty of water for a day or two following your procedure. Try to have about 8 ounces (one cup) at a time, and do this 6 times or more per day unless you have fluid restrictitons  .   AVOID irritative beverages such as coffee, tea, soda, alcoholic or citrus drinks for a day or two, as this may cause burning with urination.  . For the first 1-2 days after the procedure, your urine may be pink or red in color. You may have some blood in your urine as a normal side effect of the procedure. Large amounts of bleeding or difficulty urinating are NOT normal. Call the nurse line if this happens or go to the nearest Emergency Room if the bleeding is heavy or you cannot urinate at all and it is after hours.  Christine Brooks may experience some discomfort or a burning sensation with urination after having this procedure. You can use over the counter Azo or pyridium to help with burning and follow the instructions on the packaging. If it does not improve within 1-2 days, or other symptoms appear (fever, chills, or difficulty urinating) call the office to speak to a nurse.   .   You may return to normal daily activities such as work, school, driving, exercising and housework on the day after the procedure.

## 2020-03-19 ENCOUNTER — Encounter (HOSPITAL_COMMUNITY): Payer: Self-pay

## 2020-03-19 ENCOUNTER — Emergency Department (HOSPITAL_COMMUNITY)
Admission: EM | Admit: 2020-03-19 | Discharge: 2020-03-19 | Disposition: A | Discharge status: Home / Self Care | Payer: Self-pay | Attending: Emergency Medicine | Admitting: Emergency Medicine

## 2020-03-19 ENCOUNTER — Other Ambulatory Visit: Payer: Self-pay

## 2020-03-19 DIAGNOSIS — Z87891 Personal history of nicotine dependence: Secondary | ICD-10-CM | POA: Insufficient documentation

## 2020-03-19 DIAGNOSIS — Z8616 Personal history of COVID-19: Secondary | ICD-10-CM | POA: Insufficient documentation

## 2020-03-19 DIAGNOSIS — N809 Endometriosis, unspecified: Secondary | ICD-10-CM | POA: Insufficient documentation

## 2020-03-19 MED ORDER — OXYCODONE-ACETAMINOPHEN 5-325 MG PO TABS: 2.0000 | ORAL_TABLET | ORAL | 0 refills | Status: AC | PRN

## 2020-03-19 MED ORDER — OXYCODONE-ACETAMINOPHEN 5-325 MG PO TABS
2.0000 | ORAL_TABLET | ORAL | 0 refills | Status: DC | PRN
Start: 2020-03-19 — End: 2020-03-19

## 2020-03-19 NOTE — ED Provider Notes (Signed)
[facility]-EMERGENCY DEPT Provider Note   CSN: [id number] Arrival date & time: 03/19/20  1833     History Chief Complaint  Patient presents with  . Pelvic Pain    Christine Brooks is a 40 y.o. female.  HPI 40 year old female with history of endometriosis presents to the ER with complaints of pelvic pain.  Patient states that she has a cystoscopy scheduled on Monday, but has been having very bad pelvic pain which has been consistent with her endometriosis pain for the last several days.  She called the on-call nurse for her OB/GYN who recommended that she come to the ER for further pain management.  She was told to take Tylenol, but her pain is unrelieved with Tylenol.  She was instructed to not take any NSAIDs and thus is here for medications to hold her over until her surgery.  She denies any dysuria, hematuria, nausea, vomiting, or any other abdominal symptoms.  States this pain is very consistent with her endometriosis pain    Past Medical History:  Diagnosis Date  . Bladder mass   . Dysuria   . Endometriosis   . Endometriosis, bladder   . Hematuria   . History of 2019 novel coronavirus disease (COVID-19) 08/24/2019   positive covid test result in epic, per pt mild symptoms that resolved  . History of small bowel obstruction 02/2017   2019 post op TAH  w/ surgical bowel resection  . Lower urinary tract symptoms (LUTS)   . Nephrolithiasis    per pt nonobstructive    Patient Active Problem List   Diagnosis Date Noted  . Pelvic pressure in female 12/22/2015  . Episodic tension-type headache, not intractable 08/16/2015  . Occipital neuralgia of left side 04/20/2014  . Headache(784.0) 02/25/2013    Past Surgical History:  Procedure Laterality Date  . CESAREAN SECTION  1999;  07/ 2009  . CYSTOSCOPY WITH URETHRAL DILATATION Right 05/2018   W/  TURBT   (right ureteral stricture)  . DIAGNOSTIC LAPAROSCOPY  12/2016  . DILATION AND CURETTAGE OF UTERUS  yrs  ago  . HEMICOLECTOMY  02/2017   fews days post op TAH for bowel obstruction  . TOTAL ABDOMINAL HYSTERECTOMY  02/2017     OB History    Gravida  3   Para  2   Term  2   Preterm      AB  1   Living  2     SAB      IAB  1   Ectopic      Multiple      Live Births  2           Family History  Problem Relation Age of Onset  . Healthy Mother   . Ataxia Neg Hx   . Chorea Neg Hx   . Dementia Neg Hx   . Mental retardation Neg Hx   . Migraines Neg Hx   . Multiple sclerosis Neg Hx   . Neurofibromatosis Neg Hx   . Neuropathy Neg Hx   . Parkinsonism Neg Hx   . Seizures Neg Hx   . Stroke Neg Hx     Social History   Tobacco Use  . Smoking status: Former Smoker    Years: 2.00    Types: Cigarettes    Quit date: 03/17/2018    Years since quitting: 2.0  . Smokeless tobacco: Never Used  Vaping Use  . Vaping Use: Never used  Substance Use Topics  . Alcohol  use: Not Currently    Alcohol/week: 0.0 standard drinks    Comment: rare  . Drug use: Never    Home Medications Prior to Admission medications   Medication Sig Start Date End Date Taking? Authorizing Provider  Biotin w/ Vitamins C & E (HAIR/SKIN/NAILS) 1250-7.5-7.5 MCG-MG-UNT CHEW Chew by mouth daily.    [provider]  ibuprofen (ADVIL) 800 MG tablet Take 1 tablet (800 mg total) by mouth every 8 (eight) hours as needed. 09/02/19   Brock Bad, MD  oxyCODONE-acetaminophen (PERCOCET/ROXICET) 5-325 MG tablet Take 2 tablets by mouth every 4 (four) hours as needed for up to 3 days for severe pain. 03/19/20 03/22/20  Mare Ferrari, PA-C    Allergies    Patient has no known allergies.  Review of Systems   Review of Systems  Gastrointestinal: Negative for abdominal pain.  Genitourinary: Positive for pelvic pain. Negative for dysuria, flank pain, vaginal bleeding, vaginal discharge and vaginal pain.    Physical Exam Updated Vital Signs BP (!) 156/114 (BP Location: Right Arm)   Pulse 89   Temp  98.5 F (36.9 C) (Oral)   Resp 20   Ht 5\' 3"  (1.6 m)   Wt 49.9 kg   LMP 08/11/2016 (Exact Date)   SpO2 100%   BMI 19.49 kg/m   Physical Exam Vitals and nursing note reviewed.  Constitutional:      General: She is not in acute distress.    Appearance: She is well-developed and well-nourished. She is not ill-appearing or diaphoretic.  HENT:     Head: Normocephalic and atraumatic.  Eyes:     Conjunctiva/sclera: Conjunctivae normal.  Cardiovascular:     Rate and Rhythm: Normal rate and regular rhythm.     Heart sounds: No murmur heard.   Pulmonary:     Effort: Pulmonary effort is normal. No respiratory distress.     Breath sounds: Normal breath sounds.  Abdominal:     Palpations: Abdomen is soft.     Tenderness: There is no abdominal tenderness. There is no right CVA tenderness or left CVA tenderness.  Genitourinary:    Comments: GU exam deferred Musculoskeletal:        General: No edema. Normal range of motion.     Cervical back: Neck supple.  Skin:    General: Skin is warm and dry.  Neurological:     General: No focal deficit present.     Mental Status: She is alert and oriented to person, place, and time.  Psychiatric:        Mood and Affect: Mood and affect and mood normal.     ED Results / Procedures / Treatments   Labs (all labs ordered are listed, but only abnormal results are displayed) Labs Reviewed - No data to display  EKG None  Radiology No results found.  Procedures Procedures   Medications Ordered in ED Medications - No data to display  ED Course  I have reviewed the triage vital signs and the nursing notes.  Pertinent labs & imaging results that were available during my care of the patient were reviewed by me and considered in my medical decision making (see chart for details).    MDM Rules/Calculators/A&P                          40 year old female with complaints of endometriosis pain.  Vitals on arrival with mildly elevated blood  pressures likely secondary to pain, afebrile, not tachycardic him  to come to hypercapnic.  She has no significant pelvic tenderness on exam.  She has no flank tenderness on exam.  She reiterates that this pain is very consistent with her endometrial pain, she denies any other associated symptoms.  She is requesting pain management to hold her over until her procedure on Monday.  PDMP reviewed, appropriate, patient was prescribed  5mg  Percocet in January for 2 days.  States she had to take 2 of these pills to help manage her pain.  Unfortunately the patient is driving so I cannot provide her any medications here in the ER, but will send prescription to 24-hour pharmacy.  She was instructed to not drive after taking this medication.  Suspicion for TOA, ovarian torsion, PID at this time.  Return precautions discussed.  She was understanding and is agreeable.  Stable for discharge Final Clinical Impression(s) / ED Diagnoses Final diagnoses:  Endometriosis    Rx / DC Orders ED Discharge Orders         Ordered    oxyCODONE-acetaminophen (PERCOCET/ROXICET) 5-325 MG tablet  Every 4 hours PRN,   Status:  Discontinued        03/19/20 2123    oxyCODONE-acetaminophen (PERCOCET/ROXICET) 5-325 MG tablet  Every 4 hours PRN        03/19/20 2124           2125 03/19/20 2129    2130, MD 03/20/20 0002

## 2020-03-19 NOTE — ED Triage Notes (Signed)
Patient c/o pelvic pain and reports a history of endometriosis. patient states she has been taking Tylenol with no relief.

## 2020-03-19 NOTE — Discharge Instructions (Signed)
Do not take any additional Tylenol with this medicine.  Please take this for pain until your procedure on Monday.  Do not drink or drive on this medication.  Take at night as it can make you sleepy.  Return to the ER for any new or worsening symptoms.

## 2020-03-19 NOTE — ED Notes (Signed)
Pt verbalized understanding of d/c, medication, and follow up care. Ambulatory with steady gait.  

## 2020-03-21 ENCOUNTER — Ambulatory Visit (HOSPITAL_BASED_OUTPATIENT_CLINIC_OR_DEPARTMENT_OTHER): Payer: Self-pay | Admitting: Anesthesiology

## 2020-03-21 ENCOUNTER — Encounter (HOSPITAL_BASED_OUTPATIENT_CLINIC_OR_DEPARTMENT_OTHER)
Admission: RE | Disposition: A | Discharge status: Home / Self Care | Payer: Self-pay | Source: Home / Self Care | Attending: Obstetrics and Gynecology

## 2020-03-21 ENCOUNTER — Ambulatory Visit (HOSPITAL_BASED_OUTPATIENT_CLINIC_OR_DEPARTMENT_OTHER)
Admission: RE | Admit: 2020-03-21 | Discharge: 2020-03-21 | Disposition: A | Discharge status: Home / Self Care | Payer: Self-pay | Attending: Obstetrics and Gynecology | Admitting: Obstetrics and Gynecology

## 2020-03-21 ENCOUNTER — Encounter (HOSPITAL_BASED_OUTPATIENT_CLINIC_OR_DEPARTMENT_OTHER): Payer: Self-pay | Admitting: Obstetrics and Gynecology

## 2020-03-21 DIAGNOSIS — Z87891 Personal history of nicotine dependence: Secondary | ICD-10-CM | POA: Insufficient documentation

## 2020-03-21 DIAGNOSIS — Z8616 Personal history of COVID-19: Secondary | ICD-10-CM | POA: Insufficient documentation

## 2020-03-21 DIAGNOSIS — N3080 Other cystitis without hematuria: Secondary | ICD-10-CM | POA: Insufficient documentation

## 2020-03-21 HISTORY — DX: Unspecified symptoms and signs involving the genitourinary system: R39.9

## 2020-03-21 HISTORY — DX: Other specified disorders of bladder: N32.89

## 2020-03-21 HISTORY — PX: CYSTOSCOPY: SHX5120

## 2020-03-21 HISTORY — DX: Endometriosis of bladder, unspecified depth: N80.A0

## 2020-03-21 HISTORY — DX: Calculus of kidney: N20.0

## 2020-03-21 HISTORY — DX: Personal history of other diseases of the digestive system: Z87.19

## 2020-03-21 HISTORY — DX: Dysuria: R30.0

## 2020-03-21 HISTORY — DX: Other endometriosis: N80.8

## 2020-03-21 HISTORY — DX: Hematuria, unspecified: R31.9

## 2020-03-21 LAB — POCT PREGNANCY, URINE: Preg Test, Ur: NEGATIVE

## 2020-03-21 SURGERY — CYSTOSCOPY
Anesthesia: General | Site: Bladder

## 2020-03-21 MED ORDER — OXYCODONE HCL 5 MG PO TABS: 5.0000 mg | ORAL_TABLET | Freq: Once | ORAL | Status: DC | PRN

## 2020-03-21 MED ORDER — ONDANSETRON HCL 4 MG/2ML IJ SOLN
INTRAMUSCULAR | Status: DC | PRN
  Administered 2020-03-21: 4 mg via INTRAVENOUS

## 2020-03-21 MED ORDER — PROPOFOL 10 MG/ML IV BOLUS
INTRAVENOUS | Status: DC | PRN
  Administered 2020-03-21: 120 mg via INTRAVENOUS

## 2020-03-21 MED ORDER — WATER FOR IRRIGATION, STERILE IR SOLN
Status: DC | PRN
  Administered 2020-03-21: 3000 mL via URETHRAL

## 2020-03-21 MED ORDER — LACTATED RINGERS IV SOLN: INTRAVENOUS | Status: DC

## 2020-03-21 MED ORDER — PROPOFOL 10 MG/ML IV BOLUS
INTRAVENOUS | Status: AC
  Filled 2020-03-21: qty 20

## 2020-03-21 MED ORDER — PHENAZOPYRIDINE HCL 100 MG PO TABS
200.0000 mg | ORAL_TABLET | ORAL | Status: AC
  Administered 2020-03-21: 200 mg via ORAL

## 2020-03-21 MED ORDER — LIDOCAINE 2% (20 MG/ML) 5 ML SYRINGE
INTRAMUSCULAR | Status: DC | PRN
  Administered 2020-03-21: 60 mg via INTRAVENOUS

## 2020-03-21 MED ORDER — ONDANSETRON HCL 4 MG/2ML IJ SOLN
INTRAMUSCULAR | Status: AC
  Filled 2020-03-21: qty 2

## 2020-03-21 MED ORDER — MEPERIDINE HCL 25 MG/ML IJ SOLN: 6.2500 mg | INTRAMUSCULAR | Status: DC | PRN

## 2020-03-21 MED ORDER — OXYCODONE HCL 5 MG/5ML PO SOLN: 5.0000 mg | Freq: Once | ORAL | Status: DC | PRN

## 2020-03-21 MED ORDER — KETOROLAC TROMETHAMINE 30 MG/ML IJ SOLN
INTRAMUSCULAR | Status: AC
  Filled 2020-03-21: qty 1

## 2020-03-21 MED ORDER — MIDAZOLAM HCL 5 MG/5ML IJ SOLN
INTRAMUSCULAR | Status: DC | PRN
  Administered 2020-03-21: 2 mg via INTRAVENOUS

## 2020-03-21 MED ORDER — PHENAZOPYRIDINE HCL 100 MG PO TABS
ORAL_TABLET | ORAL | Status: AC
  Filled 2020-03-21: qty 2

## 2020-03-21 MED ORDER — POVIDONE-IODINE 10 % EX SWAB: 2.0000 "application " | Freq: Once | CUTANEOUS | Status: DC

## 2020-03-21 MED ORDER — CEFAZOLIN SODIUM-DEXTROSE 2-4 GM/100ML-% IV SOLN
INTRAVENOUS | Status: AC
  Filled 2020-03-21: qty 100

## 2020-03-21 MED ORDER — PROMETHAZINE HCL 25 MG/ML IJ SOLN: 6.2500 mg | INTRAMUSCULAR | Status: DC | PRN

## 2020-03-21 MED ORDER — LIDOCAINE 2% (20 MG/ML) 5 ML SYRINGE
INTRAMUSCULAR | Status: AC
  Filled 2020-03-21: qty 5

## 2020-03-21 MED ORDER — DEXAMETHASONE SODIUM PHOSPHATE 10 MG/ML IJ SOLN
INTRAMUSCULAR | Status: DC | PRN
  Administered 2020-03-21: 10 mg via INTRAVENOUS

## 2020-03-21 MED ORDER — FENTANYL CITRATE (PF) 100 MCG/2ML IJ SOLN
INTRAMUSCULAR | Status: AC
  Filled 2020-03-21: qty 2

## 2020-03-21 MED ORDER — FENTANYL CITRATE (PF) 100 MCG/2ML IJ SOLN: 25.0000 ug | INTRAMUSCULAR | Status: DC | PRN

## 2020-03-21 MED ORDER — DEXAMETHASONE SODIUM PHOSPHATE 10 MG/ML IJ SOLN
INTRAMUSCULAR | Status: AC
  Filled 2020-03-21: qty 1

## 2020-03-21 MED ORDER — CEFAZOLIN SODIUM-DEXTROSE 2-4 GM/100ML-% IV SOLN
2.0000 g | INTRAVENOUS | Status: AC
  Administered 2020-03-21: 2 g via INTRAVENOUS

## 2020-03-21 MED ORDER — KETOROLAC TROMETHAMINE 30 MG/ML IJ SOLN
INTRAMUSCULAR | Status: DC | PRN
  Administered 2020-03-21: 30 mg via INTRAVENOUS

## 2020-03-21 MED ORDER — KETOROLAC TROMETHAMINE 10 MG PO TABS: 10.0000 mg | ORAL_TABLET | Freq: Four times a day (QID) | ORAL | 2 refills | Status: DC | PRN

## 2020-03-21 MED ORDER — MIDAZOLAM HCL 2 MG/2ML IJ SOLN
INTRAMUSCULAR | Status: AC
  Filled 2020-03-21: qty 2

## 2020-03-21 MED ORDER — FENTANYL CITRATE (PF) 100 MCG/2ML IJ SOLN
INTRAMUSCULAR | Status: DC | PRN
  Administered 2020-03-21 (×2): 50 ug via INTRAVENOUS

## 2020-03-21 SURGICAL SUPPLY — 8 items
ELECT REM PT RETURN 9FT ADLT (ELECTROSURGICAL) ×2
ELECTRODE REM PT RTRN 9FT ADLT (ELECTROSURGICAL) ×1 IMPLANT
GLOVE SURG ENC MOIS LTX SZ6 (GLOVE) ×2 IMPLANT
GLOVE SURG UNDER POLY LF SZ6.5 (GLOVE) ×2 IMPLANT
KIT TURNOVER CYSTO (KITS) ×2 IMPLANT
MANIFOLD NEPTUNE II (INSTRUMENTS) ×2 IMPLANT
PACK CYSTO (CUSTOM PROCEDURE TRAY) ×2 IMPLANT
PENCIL BUTTON HOLSTER BLD 10FT (ELECTRODE) IMPLANT

## 2020-03-21 NOTE — Interval H&P Note (Signed)
History and Physical Interval Note:  03/21/2020 8:56 AM  Christine Brooks  has presented today for surgery, with the diagnosis of Bladder Mass.  The various methods of treatment have been discussed with the patient and family. After consideration of risks, benefits and other options for treatment, the patient has consented to  Procedure(s) with comments: CYSTOSCOPY with biopsy (N/A) - Total procedure time 1 hour as a surgical intervention.  The patient's history has been reviewed, patient examined, no change in status, stable for surgery.  I have reviewed the patient's chart and labs.  Questions were answered to the patient's satisfaction.     Marguerita Beards

## 2020-03-21 NOTE — Discharge Instructions (Signed)
Taking Care of Yourself after Cystoscopy\   .   Drink plenty of water for a day or two following your procedure. Try to have about 8 ounces (one cup) at a time, and do this 6 times or more per day unless you have fluid restrictitons  .   AVOID irritative beverages such as coffee, tea, soda, alcoholic or citrus drinks for a day or two, as this may cause burning with urination.  . For the first 1-2 days after the procedure, your urine may be pink or red in color. You may have some blood in your urine as a normal side effect of the procedure. Large amounts of bleeding or difficulty urinating are NOT normal. Call the nurse line if this happens or go to the nearest Emergency Room if the bleeding is heavy or you cannot urinate at all and it is after hours.  Bonita Quin may experience some discomfort or a burning sensation with urination after having this procedure. You can use over the counter Azo or pyridium to help with burning and follow the instructions on the packaging. If it does not improve within 1-2 days, or other symptoms appear (fever, chills, or difficulty urinating) call the office to speak to a nurse. Toradol has also been sent to your pharmacy to use for pain.   .   You may return to normal daily activities such as work, school, driving, exercising and housework on the day of the procedure.  CYSTOSCOPY HOME CARE INSTRUCTIONS  Activity: Rest for the remainder of the day.  Do not drive or operate equipment today.  You may resume normal activities in one to two days as instructed by your physician.   Meals: Drink plenty of liquids and eat light foods such as gelatin or soup this evening.  You may return to a normal meal plan tomorrow.  Return to Work: You may return to work in one to two days or as instructed by your physician.  Special Instructions / Symptoms: Call your physician if any of these symptoms occur:   -persistent or heavy bleeding  -bleeding which continues after first few  urination  -large blood clots that are difficult to pass  -urine stream diminishes or stops completely  -fever equal to or higher than 101 degrees Farenheit.  -cloudy urine with a strong, foul odor  -severe pain  Females should always wipe from front to back after elimination.  You may feel some burning pain when you urinate.  This should disappear with time.  Applying moist heat to the lower abdomen or a hot tub bath may help relieve the pain. \  Follow-Up / Date of Return Visit to Your Physician: as instructed Call for an appointment to arrange follow-up.   Post Anesthesia Home Care Instructions  Activity: Get plenty of rest for the remainder of the day. A responsible individual must stay with you for 24 hours following the procedure.  For the next 24 hours, DO NOT: -Drive a car -Advertising copywriter -Drink alcoholic beverages -Take any medication unless instructed by your physician -Make any legal decisions or sign important papers.  Meals: Start with liquid foods such as gelatin or soup. Progress to regular foods as tolerated. Avoid greasy, spicy, heavy foods. If nausea and/or vomiting occur, drink only clear liquids until the nausea and/or vomiting subsides. Call your physician if vomiting continues.  Special Instructions/Symptoms: Your throat may feel dry or sore from the anesthesia or the breathing tube placed in your throat during surgery. If  this causes discomfort, gargle with warm salt water. The discomfort should disappear within 24 hours.  If you had a scopolamine patch placed behind your ear for the management of post- operative nausea and/or vomiting:  1. The medication in the patch is effective for 72 hours, after which it should be removed.  Wrap patch in a tissue and discard in the trash. Wash hands thoroughly with soap and water. 2. You may remove the patch earlier than 72 hours if you experience unpleasant side effects which may include dry mouth, dizziness or visual  disturbances. 3. Avoid touching the patch. Wash your hands with soap and water after contact with the patch.

## 2020-03-21 NOTE — Op Note (Signed)
Operative Note  Preoperative Diagnosis: bladder mass  Postoperative Diagnosis: same  Procedures performed:  Cystourethroscopy with biopsy  Implants: None  Attending Surgeon: Lanetta Inch, MD   Anesthesia: General LMA  Findings: 1. Normal urethra with coaptation  2. Bladder with approximately 3cm mass on posterior bladder wall, about 2/3 to fundus. Mass appears lobular and cystic in some areas with dark hemorrhagic appearing areas.    Specimens:  ID Type Source Tests Collected by Time Destination  1 : posterior bladder wall mass Tissue PATH GU biopsy SURGICAL PATHOLOGY Marguerita Beards, MD 03/21/2020 [phone]     Estimated blood loss: 5 mL  IV fluids: 700 mL  Urine output: not recorded  Complications: none  Procedure in Detail:  After informed consent was obtained, the patient was taken to the operating room where anesthesia was induced. She was prepped and draped in the usual sterile fashion and given pre-operative antibiotics. A 30 degree cystoscope was introduced into urethral meatus with sterile water. Could not get complete visualization of the mass with the 30 degree scope so this was changed to a 70 degree cystoscope.  She had normal urethral coaptation and normal  urethral mucosa.  Approximately 3cm bladder mass was noted on the posterior bladder wall about 2/3 to the fundus and several centimeters from the ureteral orifices. The mass appeared lobular with cystic and dark hemorrhagic areas. See images below. She had brisk bilateral efflux from both ureteral orifices.  She had no squamous metaplasia at the trigone, no trabeculations, cellules or diverticuli.  Biopsies were taken of the mass with in 5 locations and sent to pathology. Bleeding was controlled with a bugbee and hemostasis was noted. The bladder was drained and cystoscope was removed. The patient tolerated the procedure well and was awakened and taken to the recovery area in stable condition.          Right and left ureteral orifices patent and free of disease.   Marguerita Beards, MD

## 2020-03-21 NOTE — Anesthesia Postprocedure Evaluation (Signed)
Anesthesia Post Note  Patient: Christine Brooks  Procedure(s) Performed: CYSTOSCOPY with biopsy (N/A Bladder)     Patient location during evaluation: PACU Anesthesia Type: General Level of consciousness: sedated and patient cooperative Pain management: pain level controlled Vital Signs Assessment: post-procedure vital signs reviewed and stable Respiratory status: spontaneous breathing Cardiovascular status: stable Anesthetic complications: no   No complications documented.  Last Vitals:  Vitals:   03/21/20 1045 03/21/20 1124  BP: 138/87 120/74  Pulse: 76 68  Resp: 13 16  Temp:  36.7 C  SpO2: 100% 100%    Last Pain:  Vitals:   03/21/20 1124  TempSrc:   PainSc: 0-No pain                 Lewie Loron

## 2020-03-21 NOTE — Transfer of Care (Signed)
Immediate Anesthesia Transfer of Care Note  Patient: Christine Brooks  Procedure(s) Performed: CYSTOSCOPY with biopsy (N/A Bladder)  Patient Location: PACU  Anesthesia Type:General  Level of Consciousness: awake, alert , oriented and patient cooperative  Airway & Oxygen Therapy: Patient Spontanous Breathing and Patient connected to face mask oxygen  Post-op Assessment: Report given to RN and Post -op Vital signs reviewed and stable  Post vital signs: Reviewed and stable  Last Vitals:  Vitals Value Taken Time  BP 124/71 03/21/20 1015  Temp    Pulse 93 03/21/20 1017  Resp 20 03/21/20 1017  SpO2 100 % 03/21/20 1017  Vitals shown include unvalidated device data.  Last Pain:  Vitals:   03/21/20 0830  TempSrc: Oral  PainSc: 4       Patients Stated Pain Goal: 4 (03/21/20 0830)  Complications: No complications documented.

## 2020-03-21 NOTE — Anesthesia Procedure Notes (Signed)
Procedure Name: LMA Insertion Date/Time: 03/21/2020 9:30 AM Performed by: Bishop Limbo, CRNA Pre-anesthesia Checklist: Patient identified, Emergency Drugs available, Suction available and Patient being monitored Patient Re-evaluated:Patient Re-evaluated prior to induction Oxygen Delivery Method: Circle System Utilized Preoxygenation: Pre-oxygenation with 100% oxygen Induction Type: IV induction Ventilation: Mask ventilation without difficulty LMA: LMA inserted LMA Size: 4.0 Number of attempts: 1 Placement Confirmation: positive ETCO2 Tube secured with: Tape Dental Injury: Teeth and Oropharynx as per pre-operative assessment

## 2020-03-21 NOTE — Anesthesia Preprocedure Evaluation (Addendum)
Anesthesia Evaluation  Patient identified by MRN, date of birth, ID band Patient awake    Reviewed: Allergy & Precautions, NPO status , Patient's Chart, lab work & pertinent test results  Airway Mallampati: II  TM Distance: >3 FB Neck ROM: Full    Dental  (+) Dental Advisory Given, Teeth Intact   Pulmonary neg pulmonary ROS, former smoker,    Pulmonary exam normal breath sounds clear to auscultation       Cardiovascular negative cardio ROS Normal cardiovascular exam Rhythm:Regular Rate:Normal     Neuro/Psych  Headaches,    GI/Hepatic negative GI ROS, Neg liver ROS,   Endo/Other  negative endocrine ROS  Renal/GU Renal disease     Musculoskeletal negative musculoskeletal ROS (+)   Abdominal   Peds  Hematology negative hematology ROS (+)   Anesthesia Other Findings   Reproductive/Obstetrics                            Anesthesia Physical Anesthesia Plan  ASA: II  Anesthesia Plan: General   Post-op Pain Management:    Induction: Intravenous  PONV Risk Score and Plan:   Airway Management Planned: LMA  Additional Equipment: None  Intra-op Plan:   Post-operative Plan: Extubation in OR  Informed Consent: I have reviewed the patients History and Physical, chart, labs and discussed the procedure including the risks, benefits and alternatives for the proposed anesthesia with the patient or authorized representative who has indicated his/her understanding and acceptance.     Dental advisory given  Plan Discussed with: CRNA  Anesthesia Plan Comments:        Anesthesia Quick Evaluation

## 2020-03-22 ENCOUNTER — Encounter (HOSPITAL_BASED_OUTPATIENT_CLINIC_OR_DEPARTMENT_OTHER): Payer: Self-pay | Admitting: Obstetrics and Gynecology

## 2020-03-22 ENCOUNTER — Telehealth: Payer: Self-pay | Admitting: Obstetrics and Gynecology

## 2020-03-22 LAB — SURGICAL PATHOLOGY

## 2020-03-22 NOTE — Telephone Encounter (Signed)
Spoke with patient about results of pathology from biopsy which showed:   BLADDER, POSTERIOR WALL MASS, BIOPSY:  Papillary polypoid cystitis   Comment:  The biopsy shows benign urothelium with reactive changes. Submucosa  shows hemosiderin deposit and reactive fibrosis, no definitive  endometriosis identified.    We discussed that although there was no definitive endometriosis identified, the hemosiderin and reactive fibrosis is consistent with the diagnosis of endometriosis. She has an appointment with Dr Erin Fulling tomorrow to further discuss options for ovarian suppressive therapy. She will likely then need monitoring with office cystoscopy to determine if mass has reduced then decide on need for surgical resection.   Patient expressed understanding.   Marguerita Beards, MD

## 2020-03-23 ENCOUNTER — Other Ambulatory Visit: Payer: Self-pay

## 2020-03-23 ENCOUNTER — Encounter: Payer: Self-pay | Admitting: Obstetrics and Gynecology

## 2020-03-23 ENCOUNTER — Encounter: Payer: Self-pay | Admitting: Obstetrics & Gynecology

## 2020-03-23 ENCOUNTER — Ambulatory Visit: Payer: Self-pay | Admitting: Obstetrics & Gynecology

## 2020-03-23 VITALS — BP 111/62 | HR 70 | Ht 60.0 in | Wt 119.0 lb

## 2020-03-23 DIAGNOSIS — N809 Endometriosis, unspecified: Secondary | ICD-10-CM

## 2020-03-23 DIAGNOSIS — R102 Pelvic and perineal pain: Secondary | ICD-10-CM

## 2020-03-23 DIAGNOSIS — G8929 Other chronic pain: Secondary | ICD-10-CM

## 2020-03-23 MED ORDER — NORETHINDRONE ACET-ETHINYL EST 1-20 MG-MCG PO TABS: 1.0000 | ORAL_TABLET | Freq: Every day | ORAL | 4 refills | Status: DC

## 2020-03-23 NOTE — Patient Instructions (Signed)
Oral Contraception Information Oral contraceptive pills (OCPs) are medicines taken by mouth to prevent pregnancy. They work by:  Preventing the ovaries from releasing eggs.  Thickening mucus in the lower part of the uterus (cervix). This prevents sperm from entering the uterus.  Thinning the lining of the uterus (endometrium). This prevents a fertilized egg from attaching to the endometrium. OCPs are highly effective when taken exactly as prescribed. However, OCPs do not prevent STIs (sexually transmitted infections). Using condoms while on an OCP can help prevent STIs. What happens before starting OCPs? Before you start taking OCPs:  You may have a physical exam, blood test, and Pap test.  Your health care provider will make sure you are a good candidate for oral contraception. OCPs are not a good option for certain women, such as: ? Women who smoke and are older than age 35. ? Women who have or have had certain conditions, such as:  A history of high blood pressure.  Deep vein thrombosis.  Pulmonary embolism.  Stroke.  Cardiovascular disease.  Peripheral vascular disease. Ask your health care provider about the possible side effects of the OCP you may be prescribed. Be aware that it can take 2-3 months for your body to adjust to changes in hormone levels. Types of oral contraception Birth control pills contain the hormones estrogen and progestin (synthetic progesterone) or progestin only. The combination pill This type of pill contains estrogen and progestin hormones.  Conventional contraception pills come in packs of 21 or 28 pills. ? Some packs with 28-day pills contain estrogen and progestin for the first 21-24 days. Hormone-free tablets, called placebos, are taken for the final 4-7 days. You should have menstrual bleeding during the time you take the placebos. ? In packs with 21 tablets, you take no pills for 7 days. Menstrual bleeding occurs during these days. (Some people  prefer taking a pill for 28 days to help establish a routine).  Extended-interval contraception pills come in packs of 91 pills. The first 84 tablets have both estrogen and progestin. The last 7 pills are placebos. Menstrual bleeding occurs during the placebo days. With this schedule, menstrual bleeding happens once every 3 months.  Continuous contraception pills come in packs of 28 pills. All pills in the pack contain estrogen and progestin. With this schedule, regular menstrual bleeding does not happen, but there may be spotting or irregular bleeding. Progestin-only pills This type of pill is often called the mini-pill and contains the progestin hormone only. It comes in packs of 28 pills. In some packs, the last 4 pills are placebos. The pill must be taken at the same time every day. This is very important to prevent pregnancy. Menstrual bleeding may not be regular or predictable.   What are the advantages? Oral contraception provides reliable and continuous contraception if taken as directed. It may treat or decrease symptoms of:  Menstrual period cramps.  Irregular menstrual cycle or bleeding.  Heavy menstrual flow.  Abnormal uterine bleeding.  Acne, depending on the type of pill.  Polycystic ovarian syndrome (POS).  Endometriosis.  Iron deficiency anemia.  Premenstrual symptoms, including severe irritability, depression, or anxiety. It also may:  Reduce the risk of endometrial and ovarian cancer.  Be used as emergency contraception.  Prevent ectopic pregnancies and infections of the fallopian tubes. What can make OCPs less effective? OCPs may be less effective if:  You forget to take the pill every day. For progestin-only pills, it is especially important to take the pill at the   same time each day. Even taking it 3 hours late can increase the risk of pregnancy.  You have a stomach or intestinal disease that reduces your body's ability to absorb the pill.  You take OCPs  with other medicines that make OCPs less effective, such as antibiotics, certain HIV medicines, and some seizure medicines.  You take expired OCPs.  You forget to restart the pill after 7 days of not taking it. This refers to the packs of 21 pills. What are the side effects and risks? OCPs can sometimes cause side effects, such as:  Headache.  Depression.  Trouble sleeping.  Nausea and vomiting.  Breast tenderness.  Irregular bleeding or spotting during the first several months.  Bloating or fluid retention.  Increase in blood pressure. Combination pills may slightly increase the risk of:  Blood clots.  Heart attack.  Stroke. Follow these instructions at home: Follow instructions from your health care provider about how to start taking your first cycle of OCPs. Depending on when you start the pill, you may need to use a backup form of birth control, such as condoms, during the first week. Make sure you know what steps to take if you forget to take the pill. Summary  Oral contraceptive pills (OCPs) are medicines taken by mouth to prevent pregnancy. They are highly effective when taken exactly as prescribed.  OCPs contain a combination of the hormones estrogen and progestin (synthetic progesterone) or progestin only.  Before you start taking the pill, you may have a physical exam, blood test, and Pap test. Your health care provider will make sure you are a good candidate for oral contraception.  The combination pill may come in a 21-day pack, a 28-day pack, or a 91-day pack. Progestin-only pills come in packs of 28 pills.  OCPs can sometimes cause side effects, such as headache, nausea, breast tenderness, or irregular bleeding. This information is not intended to replace advice given to you by your health care provider. Make sure you discuss any questions you have with your health care provider. Document Revised: 10/02/2019 Document Reviewed: 09/10/2019 Elsevier Patient  Education  2021 [employer]. Endometriosis  Endometriosis is a condition in which a tissue similar to the endometrium grows in places outside the uterus. The endometrium is a tissue that forms the lining of the uterus. This tissue can grow in the organs that create the eggs (ovaries), the tubes that carry the eggs to the uterus (fallopian tubes), the vagina, and the bowel. This tissue most often grows on the ovaries and inner lining of the pelvic cavity (peritoneum). When the uterus sheds the endometrium every menstrual cycle, there is bleeding wherever these types of tissue are located. This can cause pain because blood is irritating to tissues that are not normally exposed to it. Endometriosis can also make it harder for a woman to get pregnant. What are the causes? The cause of this condition is not known. What increases the risk? The following factors may make you more likely to develop this condition:  Having a family history of endometriosis.  Having never given birth.  Starting your period at 38 years of age or younger. What are the signs or symptoms? Often, there are no symptoms of this condition. If you do have symptoms, they may:  Vary depending on where the abnormal tissue is growing.  Occur during your menstrual period (most often) or at the middle of your cycle.  Come and go. You may have no symptoms during some months.  Stop when you no longer have your monthly periods (menopause). Symptoms may include:  Pain in the area between your hip bones (pelvis).  Heavier bleeding during periods.  Menstrual periods that happen more than once a month.  Pain during sex.  Pain in the back or abdomen.  Painful bowel movements.  Not being able to get pregnant. How is this diagnosed? This condition is diagnosed based on your symptoms and a physical exam. You may have tests, such as:  Blood tests and urine tests to help rule out other causes.  Ultrasound to look for  tissues that are not normal. This is often done over your skin. It is sometimes done through the vagina (transvaginal).  X-ray of the lower bowel (barium enema).  CT scan.  MRI. To confirm the diagnosis, your health care provider may use a device with a small camera to check tissue inside your abdomen (laparoscopy). Abnormal tissue may be removed and checked in a lab (biopsy). How is this treated? There is no cure for this condition. Treatment focuses on controlling your symptoms. The type of treatment also depends on whether you want to become pregnant in the future. This condition may be treated with:  Medicines. These may include: ? Medicines to relieve pain, including NSAIDs such as ibuprofen. ? Hormone therapy. This uses artificial hormones to slow the growth of the abnormal tissue. This may include hormonal birth control, such as pills.  Surgery to remove the abnormal tissue. During surgery: ? Tissue may be removed using a laparoscope and a laser (laparoscopic laser treatment). ? The fallopian tubes, uterus, and ovaries may be removed (hysterectomy). This is done in very severe cases. Follow these instructions at home:  Get regular exercise.  Limit alcohol use.  Eat a balanced diet.  Avoid caffeine.  Take over-the-counter and prescription medicines only as told by your health care provider.  Keep all follow-up visits as told by your health care provider. This is important. Where to find more information  [employer]: GrandLunch.it  Office on Women's Health: https://www.[place].net/ Contact a health care provider if:  You are having new pain or trouble controlling pain.  You have problems getting pregnant.  You have a fever. Get help right away if you have:  Severe pain that does not get better with medicine.  Severe nausea and vomiting, or if you cannot eat or drink without vomiting.  Pain that affects your  abdomen only on the lower, right side.  Pain in your abdomen that gets worse.  Swelling in your abdomen.  Blood in your stool (feces). Summary  Endometriosis is a condition in which a tissue similar to the endometrium grows in places outside the uterus. The endometrium is a tissue that forms the lining of the uterus.  The cause of this condition is not known.  This condition may be treated with medicines to relieve pain, hormone therapy, or surgery.  If you have this condition, get regular exercise, limit alcohol use, and avoid caffeine.  Get help right away if you have severe pain that does not get better with medicine, or if you have severe nausea and vomiting or blood in your stool. This information is not intended to replace advice given to you by your health care provider. Make sure you discuss any questions you have with your health care provider. Document Revised: 02/18/2019 Document Reviewed: 02/18/2019 Elsevier Patient Education  2021 [employer].

## 2020-03-23 NOTE — Progress Notes (Signed)
Patient referred from Dr. Clearance Coots [facility]). Patient had cystoscopy with Dr. Oleh Genin on 03-21-20. Per patient Dr. Oleh Genin sent her to Dr. Erin Fulling due to endometriosis. Armandina Stammer, RN  03/23/2020 9:34 AM

## 2020-03-23 NOTE — Progress Notes (Signed)
History:  40 y.o. [id number] here today for manamgemt of endometriosis. Pt was referred by Dr. Florian Buff. She is s/p recent cystocoyy for eval of mass on bladder.   Pt reports a history of several years of pelvic pain. She reports that there was a delay in diagnosis. She reports that she had a laparoscopy which was not diagnostic. This was followed by a abd hysterectomy (no BSO). Soon after the hysterectomy, she had a bowel obstruction which required laparotomy. She has also had c/s x 2.   Pts main sx at present are the pelvic pain and urinary frequency. She wants relief from the pain.  Pt reports that she had a cystoscopy following her hyst that led to the dx of endometriosis. She was started on Orilissa. She took it for about 2 weeks but, felt that she was not having sx so she stopped it. She does not think it helped with her sx because she was not having them at the time.  Pt had cystoscopy 2 days prev.      The following portions of the patient's history were reviewed and updated as appropriate: allergies, current medications, past family history, past medical history, past social history, past surgical history and problem list.  Review of Systems:  Pertinent items are noted in HPI.    Objective:  Physical Exam Blood pressure 111/62, pulse 70, height 5' (1.524 m), weight 119 lb (54 kg), last menstrual period 08/11/2016.  CONSTITUTIONAL: Well-developed, well-nourished female in no acute distress.  HENT:  Normocephalic, atraumatic EYES: Conjunctivae and EOM are normal. No scleral icterus.  NECK: Normal range of motion SKIN: Skin is warm and dry. No rash noted. Not diaphoretic.No pallor. NEUROLGIC: Alert and oriented to person, place, and time. Normal coordination.  Abd: Soft, nontender and nondistended. Well healed transverse incisions.   Pelvic: not repeated today   Labs and Imaging US PELVIC COMPLETE WITH TRANSVAGINAL  Result Date: 03/08/2020 CLINICAL DATA:  40 year old female with history  of endometriosis and known bladder invasion by report. EXAM: TRANSABDOMINAL AND TRANSVAGINAL ULTRASOUND OF PELVIS TECHNIQUE: Both transabdominal and transvaginal ultrasound examinations of the pelvis were performed. Transabdominal technique was performed for global imaging of the pelvis including uterus, ovaries, adnexal regions, and pelvic cul-de-sac. It was necessary to proceed with endovaginal exam following the transabdominal exam to visualize the RIGHT and LEFT adnexa. COMPARISON:  September 09, 2019 FINDINGS: Uterus Surgically absent. Right ovary Measurements: 3.2 x 1.9 x 2.0 cm = volume: 6.3 mL. Small cyst arising from the RIGHT ovary 1.1 x 1.0 x 0.6 cm. Left ovary Measurements: 4.2 x 2.2 x 3.3 cm = volume: 15.8 mL. Small hemorrhagic cyst diminished from previous exam 2.0 x 1.9 x 1.8 cm on today's study, previously 3.3 x 2.9 x 3.0 cm. Other findings Small free fluid in the pelvis. Partially collapsed urinary bladder displays a 3.0 x 2.1 x 2.9 cm irregular mass with internal vascularity which appears to involve the bladder wall in a transmural fashion. This previously measured approximately 3.1 x 1.6 x 3.2 cm IMPRESSION: Post hysterectomy with diminished size of hemorrhagic cyst of the LEFT ovary versus is formation of corpus luteum in this location since prior imaging. Bladder mass with involvement of the posterior urinary bladder in a transmural fashion. Potentially mildly enlarged since August of 2021; by report the patient has bladder involvement from endometriosis which could certainly have this appearance. By ultrasound bladder neoplasm/urothelial carcinoma could have a similar appearance. Cystoscopic correlation may be helpful if not yet performed to establish diagnosis.  Electronically Signed   By: Donzetta Kohut M.D.   On: 03/08/2020 17:38   03/21/2020 A. BLADDER, POSTERIOR WALL MASS, BIOPSY:  Papillary polypoid cystitis   Comment:  The biopsy shows benign urothelium with reactive changes. Submucosa   shows hemosiderin deposit and reactive fibrosis, no definitive  endometriosis identified.   Assessment & Plan:  Biopsy proven endometriosis. Referred for suppression of endometriosis. I reviewed with pt risk vs benefits of suppression vs BSO.  I am cautious about surgery in this pt with the plethora of abd surgeries that she has already had and the known scar tissue. I have reviewed with her the risks of surgery. I discussed with her the options for suppressive therapy including continuous OCPs, Orilissa and Depo Lupron. Pt wants to start with COCPs.   Cont COCPs. No contraindications. (prev tobacco use. None for 2 years.  F/u in 3 months or sooner prn\ Records reviewed All questions answered.   Total face-to-face time with patient was 30 min.  Greater than 50% was spent in counseling and coordination of care with the patient.   Carolyn L. Harraway-Smith, M.D., [place]

## 2020-03-30 ENCOUNTER — Other Ambulatory Visit: Payer: Self-pay | Admitting: Obstetrics and Gynecology

## 2020-03-30 DIAGNOSIS — N3281 Overactive bladder: Secondary | ICD-10-CM

## 2020-03-30 MED ORDER — OXYBUTYNIN CHLORIDE ER 5 MG PO TB24: 5.0000 mg | ORAL_TABLET | Freq: Every day | ORAL | 11 refills | Status: DC

## 2020-03-30 NOTE — Progress Notes (Signed)
Called patient to schedule her for follow up cystoscopy in May. She reports that she has been having increasing urgency and frequency and loss of urine. Has taken Myrbetriq in the past but is expensive (used samples before). Will prescribe Oxybutynin 5mg  XL tablets daily. She can use [facility] for discount. She will let know if there are any issues obtaining the prescription or side effects.   [place], MD

## 2020-04-05 ENCOUNTER — Ambulatory Visit: Payer: Self-pay | Admitting: Obstetrics and Gynecology

## 2020-04-28 ENCOUNTER — Telehealth: Payer: Self-pay | Admitting: General Practice

## 2020-04-28 NOTE — Telephone Encounter (Signed)
Called patient to schedule 3 month follow up with Dr. Erin Fulling in June.  Patient stated that she will call office next week to schedule.

## 2020-05-09 ENCOUNTER — Other Ambulatory Visit: Payer: Self-pay

## 2020-05-09 ENCOUNTER — Encounter: Payer: Self-pay | Admitting: Obstetrics and Gynecology

## 2020-05-09 ENCOUNTER — Ambulatory Visit (INDEPENDENT_AMBULATORY_CARE_PROVIDER_SITE_OTHER): Payer: Self-pay | Admitting: Obstetrics and Gynecology

## 2020-05-09 VITALS — BP 114/77 | HR 106 | Ht 60.0 in | Wt 119.0 lb

## 2020-05-09 DIAGNOSIS — N3289 Other specified disorders of bladder: Secondary | ICD-10-CM

## 2020-05-09 DIAGNOSIS — R310 Gross hematuria: Secondary | ICD-10-CM

## 2020-05-09 LAB — POCT URINALYSIS DIPSTICK
Appearance: NORMAL
Bilirubin, UA: NEGATIVE
Blood, UA: NEGATIVE
Glucose, UA: NEGATIVE
Ketones, UA: NEGATIVE
Leukocytes, UA: NEGATIVE
Nitrite, UA: NEGATIVE
Protein, UA: NEGATIVE
Spec Grav, UA: 1.025 (ref 1.010–1.025)
Urobilinogen, UA: 0.2 E.U./dL
pH, UA: 5 (ref 5.0–8.0)

## 2020-05-09 MED ORDER — LIDOCAINE HCL URETHRAL/MUCOSAL 2 % EX GEL
1.0000 "application " | Freq: Once | CUTANEOUS | Status: AC
  Administered 2020-05-09: 1 via URETHRAL

## 2020-05-09 NOTE — Patient Instructions (Signed)
Taking Care of Yourself after Urodynamics, Cystoscopy, Coaptite Injection, or Botox Injection   Drink plenty of water for a day or two following your procedure. Try to have about 8 ounces (one cup) at a time, and do this 6 times or more per day unless you have fluid restrictitons AVOID irritative beverages such as coffee, tea, soda, alcoholic or citrus drinks for a day or two, as this may cause burning with urination.  For the first 1-2 days after the procedure, your urine may be pink or red in color. You may have some blood in your urine as a normal side effect of the procedure. Large amounts of bleeding or difficulty urinating are NOT normal. Call the nurse line if this happens or go to the nearest Emergency Room if the bleeding is heavy or you cannot urinate at all and it is after hours.  You may experience some discomfort or a burning sensation with urination after having this procedure. You can use over the counter Azo or pyridium to help with burning and follow the instructions on the packaging. If it does not improve within 1-2 days, or other symptoms appear (fever, chills, or difficulty urinating) call the office to speak to a nurse.  You may return to normal daily activities such as work, school, driving, exercising and housework on the day of the procedure. If your doctor gave you a prescription, take it as ordered.  If you need a return appointment, the front desk staff will arrange it when you check out.   

## 2020-05-09 NOTE — Progress Notes (Signed)
CYSTOSCOPY  CC:  This is a 40 y.o. with bladder mass who presents today for cystoscopy.  Has felt her symptoms have greatly improved. She no longer has pain after starting the COCs. Her bladder urgency has also improved with the oxybutynin. Denies any side effects of dry mouth, dry eyes.   POC urine: negative  BP 114/77   Pulse (!) 106   Ht 5' (1.524 m)   Wt 119 lb (54 kg)   LMP 08/11/2016 (Exact Date)   BMI 23.24 kg/m   CYSTOSCOPY: A time out was performed.  The periurethral area was prepped and draped in a sterile manner.  2% lidocaine jetpack was inserted at the urethral meatus and the urethra and bladder visualized with 0- and 70-degree scopes.  She had normal urethral coaptation and normal urethral mucosa.  Bladder mass identified at bladder dome and noted to be similar in size and appearance to last cystoscopy. Hemosiderin deposits noted. See images below. She had bilateral clear efflux from both ureteral orifices.  She had no squamous metaplasia at the trigone, no trabeculations, cellules or diverticuli.           ASSESSMENT:  40 y.o. with bladder mass consistent with endometriosis on prior biopsy. Cystoscopy today is stable.   PLAN:  - Continue treatment with COCs, has follow up with Dr Burnice LoganKatrinka Blazing. Emphasized the importance of compliance with this medication for suppression of endometriosis.  - Continue oxybutynin 5mg  XL. Has enough refills provided.  - We discussed that she will not likely need any surgical intervention if her pain and other symptoms are controlled. We would then intermittently monitor the bladder mass.   Follow up 6 months  , MD

## 2020-05-10 ENCOUNTER — Encounter: Payer: Self-pay | Admitting: Obstetrics

## 2020-05-10 ENCOUNTER — Ambulatory Visit (INDEPENDENT_AMBULATORY_CARE_PROVIDER_SITE_OTHER): Payer: Self-pay | Admitting: Obstetrics

## 2020-05-10 VITALS — BP 124/82 | HR 69 | Wt 116.0 lb

## 2020-05-10 DIAGNOSIS — N6325 Unspecified lump in the left breast, overlapping quadrants: Secondary | ICD-10-CM

## 2020-05-10 NOTE — Progress Notes (Signed)
Pt states left breast lump, hard marble size x couple months.  Pt states breast is achy and heavy.   Pt states no family hx of breast issues.

## 2020-05-10 NOTE — Progress Notes (Signed)
Patient ID: Christine Brooks, female   DOB: [birth year], 40 y.o.   MRN: [id number]  HPI Christine Brooks is a 40 y.o. female.  Complains of mass in left breast, tender. HPI  Past Medical History:  Diagnosis Date  . Bladder mass   . Dysuria   . Endometriosis   . Endometriosis, bladder   . Hematuria   . History of 2019 novel coronavirus disease (COVID-19) 08/24/2019   positive covid test result in epic, per pt mild symptoms that resolved  . History of small bowel obstruction 02/2017   2019 post op TAH  w/ surgical bowel resection  . Lower urinary tract symptoms (LUTS)   . Nephrolithiasis    per pt nonobstructive    Past Surgical History:  Procedure Laterality Date  . CESAREAN SECTION  1999;  07/ 2009  . CYSTOSCOPY N/A 03/21/2020   Procedure: CYSTOSCOPY with biopsy;  Surgeon: Marguerita Beards, MD;  Location: [facility];  Service: Gynecology;  Laterality: N/A;  Total procedure time 1 hour  . CYSTOSCOPY WITH URETHRAL DILATATION Right 05/2018   W/  TURBT   (right ureteral stricture)  . DIAGNOSTIC LAPAROSCOPY  12/2016  . DILATION AND CURETTAGE OF UTERUS  yrs ago  . HEMICOLECTOMY  02/2017   fews days post op TAH for bowel obstruction  . TOTAL ABDOMINAL HYSTERECTOMY  02/2017    Family History  Problem Relation Age of Onset  . Healthy Mother   . Ataxia Neg Hx   . Chorea Neg Hx   . Dementia Neg Hx   . Mental retardation Neg Hx   . Migraines Neg Hx   . Multiple sclerosis Neg Hx   . Neurofibromatosis Neg Hx   . Neuropathy Neg Hx   . Parkinsonism Neg Hx   . Seizures Neg Hx   . Stroke Neg Hx     Social History Social History   Tobacco Use  . Smoking status: Former Smoker    Years: 2.00    Types: Cigarettes    Quit date: 03/17/2018    Years since quitting: 2.1  . Smokeless tobacco: Never Used  Vaping Use  . Vaping Use: Never used  Substance Use Topics  . Alcohol use: Not Currently    Alcohol/week: 0.0 standard drinks    Comment: rare  . Drug use: Never     No Known Allergies  Current Outpatient Medications  Medication Sig Dispense Refill  . norethindrone-ethinyl estradiol (LOESTRIN 1/20, 21,) 1-20 MG-MCG tablet Take 1 tablet by mouth daily. Do NOT take inactive pills. 3 tablet 4  . oxybutynin (DITROPAN-XL) 5 MG 24 hr tablet Take 1 tablet (5 mg total) by mouth daily. 30 tablet 11   No current facility-administered medications for this visit.    Review of Systems Review of Systems Constitutional: negative for fatigue and weight loss Respiratory: negative for cough and wheezing Cardiovascular: negative for chest pain, fatigue and palpitations Gastrointestinal: negative for abdominal pain and change in bowel habits Genitourinary:negative Integument/breast: positive for left breast mass.  negative for nipple discharge Musculoskeletal:negative for myalgias Neurological: negative for gait problems and tremors Behavioral/Psych: negative for abusive relationship, depression Endocrine: negative for temperature intolerance      Blood pressure 124/82, pulse 69, weight 116 lb (52.6 kg), last menstrual period 08/11/2016.  Physical Exam Physical Exam General:   alert and no distress  Skin:   no rash or abnormalities  Lungs:   clear to auscultation bilaterally  Heart:   regular rate and rhythm, S1, S2  normal, no murmur, click, rub or gallop  Breasts:    Left:  Soft mass at 3 o'clock, mobile, non tender    I have spent a total of 20 minutes of face-to-face time, excluding clinical staff time, reviewing notes and preparing to see patient, ordering tests and/or medications, and counseling the patient.   Data Reviewed Labs Previous Breast Imaging  Assessment     1. Breast lump on left side at 3 o'clock position Rx: - MM DIAG BREAST TOMO BILATERAL; Future - US BREAST LTD UNI LEFT INC AXILLA; Future    Plan   Follow up in 2 weeks, MyChart  Orders Placed This Encounter  Procedures  . MM DIAG BREAST TOMO BILATERAL    Standing  Status:   Future    Standing Expiration Date:   05/10/2021    Order Specific Question:   Reason for Exam (SYMPTOM  OR DIAGNOSIS REQUIRED)    Answer:   Breast lump - left breast    Order Specific Question:   Is the patient pregnant?    Answer:   No    Order Specific Question:   Preferred imaging location?    Answer:   [facility] [place]  . US BREAST LTD UNI LEFT INC AXILLA    Standing Status:   Future    Standing Expiration Date:   05/10/2021    Order Specific Question:   Reason for Exam (SYMPTOM  OR DIAGNOSIS REQUIRED)    Answer:   Breast lump - left breast    Order Specific Question:   Preferred imaging location?    Answer:   [id number] [address] Heir, MD 05/10/2020 12:06 PM

## 2020-05-16 ENCOUNTER — Other Ambulatory Visit: Payer: Self-pay

## 2020-05-16 DIAGNOSIS — N6320 Unspecified lump in the left breast, unspecified quadrant: Secondary | ICD-10-CM

## 2020-05-19 ENCOUNTER — Ambulatory Visit: Payer: Self-pay | Admitting: *Deleted

## 2020-05-19 ENCOUNTER — Other Ambulatory Visit: Payer: Self-pay

## 2020-05-19 VITALS — BP 126/74 | Wt 116.7 lb

## 2020-05-19 DIAGNOSIS — N6321 Unspecified lump in the left breast, upper outer quadrant: Secondary | ICD-10-CM

## 2020-05-19 DIAGNOSIS — Z1239 Encounter for other screening for malignant neoplasm of breast: Secondary | ICD-10-CM

## 2020-05-19 NOTE — Progress Notes (Signed)
[name] is a 40 y.o. female who presents to [facility] clinic today with complaint of left breast lump since December 2021 that is painful. Patient states the pain comes and goes. Patient rates the pain at a 8 out of 10.    Pap Smear: Pap smear not completed today. Last Pap smear was 12/01/2015 at [facility] clinic and was normal with negative HPV. Per patient has no history of an abnormal Pap smear. Patient has a history of a hysterectomy in 2019 due to endometriosis. Patient doesn't need any further Pap smears due to her history of a hysterectomy for benign reasons per BCCCP and ASCCP guidelines. Last Pap smear result is available in Epic.   Physical exam: Breasts Breasts symmetrical. No skin abnormalities bilateral breasts. No nipple retraction bilateral breasts. No nipple discharge bilateral breasts. No lymphadenopathy. No lumps palpated right breast. Palpated a pea sized mobile lump within the left breast at 1 o'clock 6 cm from the nipple. No complaints of pain or tenderness on exam.   Pelvic/Bimanual Pap is not indicated today per BCCCP guidelines.   Smoking History: Patient is a former smoker that quit 03/17/2018.   Patient Navigation: Patient education provided. Access to services provided for patient through BCCCP program.    Breast and Cervical Cancer Risk Assessment: Patient does not have family history of breast cancer, known genetic mutations, or radiation treatment to the chest before age 85. Patient does not have history of cervical dysplasia, immunocompromised, or DES exposure in-utero.  Risk Assessment    Risk Scores      05/19/2020   Last edited by: Christine Rutherford, LPN   5-year risk: 0.4 %   Lifetime risk: 7.5 %          A: BCCCP exam without pap smear Complaint of left breast lump and pain.  P: Referred patient to the [facility] for a diagnostic mammogram. Appointment scheduled Tuesday, Jun 09, 2020 at  1520.  Christine Heidelberg, RN 05/19/2020 3:01 PM

## 2020-05-19 NOTE — Patient Instructions (Signed)
Explained breast self awareness with [name]. Patient did not need a Pap smear today due to patient has a history of a hysterectomy for benign reasons. Let patient know that she doesn't need any further Pap smears due to her history of a hysterectomy for benign reasons. Referred patient to the [facility] for a diagnostic mammogram. Appointment scheduled Tuesday, Jun 09, 2020 at 1520. Patient aware of appointment and will be there. [name] verbalized understanding.  Mansur Patti, Kathaleen Maser, RN 3:01 PM

## 2020-05-20 ENCOUNTER — Ambulatory Visit: Payer: Self-pay | Admitting: Obstetrics and Gynecology

## 2020-05-24 ENCOUNTER — Other Ambulatory Visit: Payer: Self-pay

## 2020-06-09 ENCOUNTER — Other Ambulatory Visit: Payer: Self-pay

## 2020-06-09 ENCOUNTER — Ambulatory Visit
Admission: RE | Admit: 2020-06-09 | Discharge: 2020-06-09 | Disposition: A | Discharge status: Home / Self Care | Payer: No Typology Code available for payment source | Source: Ambulatory Visit | Attending: Obstetrics and Gynecology | Admitting: Obstetrics and Gynecology

## 2020-06-09 DIAGNOSIS — N6320 Unspecified lump in the left breast, unspecified quadrant: Secondary | ICD-10-CM

## 2020-07-01 ENCOUNTER — Ambulatory Visit (HOSPITAL_COMMUNITY)
Admission: EM | Admit: 2020-07-01 | Discharge: 2020-07-01 | Disposition: A | Discharge status: Home / Self Care | Payer: Self-pay | Attending: Emergency Medicine | Admitting: Emergency Medicine

## 2020-07-01 ENCOUNTER — Ambulatory Visit (HOSPITAL_COMMUNITY)
Admission: EM | Admit: 2020-07-01 | Discharge: 2020-07-01 | Disposition: A | Discharge status: Home / Self Care | Payer: BLUE CROSS/BLUE SHIELD | Attending: Internal Medicine | Admitting: Internal Medicine

## 2020-07-01 ENCOUNTER — Encounter (HOSPITAL_COMMUNITY): Payer: Self-pay

## 2020-07-01 ENCOUNTER — Telehealth (HOSPITAL_COMMUNITY): Payer: Self-pay | Admitting: Emergency Medicine

## 2020-07-01 ENCOUNTER — Other Ambulatory Visit: Payer: Self-pay

## 2020-07-01 DIAGNOSIS — Z20822 Contact with and (suspected) exposure to covid-19: Secondary | ICD-10-CM | POA: Diagnosis present

## 2020-07-01 DIAGNOSIS — U071 COVID-19: Secondary | ICD-10-CM | POA: Diagnosis not present

## 2020-07-01 DIAGNOSIS — J029 Acute pharyngitis, unspecified: Secondary | ICD-10-CM | POA: Insufficient documentation

## 2020-07-01 LAB — POCT RAPID STREP A, ED / UC: Streptococcus, Group A Screen (Direct): NEGATIVE

## 2020-07-01 MED ORDER — DEXAMETHASONE 10 MG/ML FOR PEDIATRIC ORAL USE
10.0000 mg | Freq: Once | INTRAMUSCULAR | Status: AC
  Administered 2020-07-01: 10:00:00 10 mg via ORAL

## 2020-07-01 MED ORDER — DEXAMETHASONE SODIUM PHOSPHATE 10 MG/ML IJ SOLN
INTRAMUSCULAR | Status: AC
  Filled 2020-07-01: qty 1

## 2020-07-01 NOTE — Discharge Instructions (Addendum)
Your strep test was negative. Strep culture is pending. Your Decadron given today should be active in 1 to 2 hours and will help with your sore throat. Please rest and hydrate at home. You can check your COVID-19 results through MyChart.

## 2020-07-01 NOTE — Telephone Encounter (Signed)
Lab unable to run patient's COVID test, cancelled.  This Rn notified patient of need for recollect, staff on site notified

## 2020-07-01 NOTE — ED Provider Notes (Signed)
[facility]  ____________________________________________  Time seen: Approximately 9:21 AM  I have reviewed the triage vital signs and the nursing notes.   HISTORY  Chief Complaint Headache, Chills, and Sore Throat   Historian Patient    HPI Christine Brooks is a 40 y.o. female presents to the urgent care with headache, pharyngitis, nasal congestion and body aches for the past 2 days.  Patient has had chills and is unsure of fever at home.  No dysuria, hematuria or increased urinary frequency.  She denies known sick contacts in the home.  No chest pain, chest tightness or abdominal pain.   Past Medical History:  Diagnosis Date   Bladder mass    Dysuria    Endometriosis    Endometriosis, bladder    Hematuria    History of 2019 novel coronavirus disease (COVID-19) 08/24/2019   positive covid test result in epic, per pt mild symptoms that resolved   History of small bowel obstruction 02/2017   2019 post op TAH  w/ surgical bowel resection   Lower urinary tract symptoms (LUTS)    Nephrolithiasis    per pt nonobstructive     Immunizations up to date:  Yes.     Past Medical History:  Diagnosis Date   Bladder mass    Dysuria    Endometriosis    Endometriosis, bladder    Hematuria    History of 2019 novel coronavirus disease (COVID-19) 08/24/2019   positive covid test result in epic, per pt mild symptoms that resolved   History of small bowel obstruction 02/2017   2019 post op TAH  w/ surgical bowel resection   Lower urinary tract symptoms (LUTS)    Nephrolithiasis    per pt nonobstructive    Patient Active Problem List   Diagnosis Date Noted   Pelvic pressure in female 12/22/2015   Episodic tension-type headache, not intractable 08/16/2015   Occipital neuralgia of left side 04/20/2014   Headache(784.0) 02/25/2013    Past Surgical History:  Procedure Laterality Date   CESAREAN SECTION  1999;  07/ 2009   CYSTOSCOPY N/A 03/21/2020   Procedure:  CYSTOSCOPY with biopsy;  Surgeon: Marguerita Beards, MD;  Location: [facility];  Service: Gynecology;  Laterality: N/A;  Total procedure time 1 hour   CYSTOSCOPY WITH URETHRAL DILATATION Right 05/2018   W/  TURBT   (right ureteral stricture)   DIAGNOSTIC LAPAROSCOPY  12/2016   DILATION AND CURETTAGE OF UTERUS  yrs ago   HEMICOLECTOMY  02/2017   fews days post op TAH for bowel obstruction   TOTAL ABDOMINAL HYSTERECTOMY  02/2017    Prior to Admission medications   Medication Sig Start Date End Date Taking? Authorizing Provider  norethindrone-ethinyl estradiol (LOESTRIN 1/20, 21,) 1-20 MG-MCG tablet Take 1 tablet by mouth daily. Do NOT take inactive pills. 03/23/20   Willodean Rosenthal, MD  oxybutynin (DITROPAN-XL) 5 MG 24 hr tablet Take 1 tablet (5 mg total) by mouth daily. 03/30/20   Marguerita Beards, MD    Allergies Patient has no known allergies.  Family History  Problem Relation Age of Onset   Healthy Mother    Ataxia Neg Hx    Chorea Neg Hx    Dementia Neg Hx    Mental retardation Neg Hx    Migraines Neg Hx    Multiple sclerosis Neg Hx    Neurofibromatosis Neg Hx    Neuropathy Neg Hx    Parkinsonism Neg Hx    Seizures Neg Hx  Stroke Neg Hx     Social History Social History   Tobacco Use   Smoking status: Former    Years: 2.00    Pack years: 0.00    Types: Cigarettes    Quit date: 03/17/2018    Years since quitting: 2.2   Smokeless tobacco: Never  Vaping Use   Vaping Use: Never used  Substance Use Topics   Alcohol use: Not Currently    Alcohol/week: 0.0 standard drinks    Comment: rare   Drug use: Never     Review of Systems  Constitutional: Patient has fever.  Eyes:  No discharge ENT: No upper respiratory complaints. Respiratory: Patient has sporadic cough.  Gastrointestinal:   No nausea, no vomiting.  No diarrhea.  No constipation. Musculoskeletal: Negative for musculoskeletal pain. Neuro: Patient has headache. Skin:  Negative for rash, abrasions, lacerations, ecchymosis.   ____________________________________________   PHYSICAL EXAM:  VITAL SIGNS: ED Triage Vitals  Enc Vitals Group     BP 07/01/20 0837 120/67     Pulse Rate 07/01/20 0837 100     Resp 07/01/20 0837 20     Temp 07/01/20 0837 99.5 F (37.5 C)     Temp Source 07/01/20 0837 Oral     SpO2 07/01/20 0837 100 %     Weight --      Height --      Head Circumference --      Peak Flow --      Pain Score 07/01/20 0838 10     Pain Loc --      Pain Edu? --      Excl. in GC? --      Constitutional: Alert and oriented. Patient is lying supine. Eyes: Conjunctivae are normal. PERRL. EOMI. Head: Atraumatic. ENT:      Ears: Tympanic membranes are mildly injected with mild effusion bilaterally.       Nose: No congestion/rhinnorhea.      Mouth/Throat: Mucous membranes are moist. Posterior pharynx is mildly erythematous.  Hematological/Lymphatic/Immunilogical: No cervical lymphadenopathy.  Cardiovascular: Normal rate, regular rhythm. Normal S1 and S2.  Good peripheral circulation. Respiratory: Normal respiratory effort without tachypnea or retractions. Lungs CTAB. Good air entry to the bases with no decreased or absent breath sounds. Gastrointestinal: Bowel sounds 4 quadrants. Soft and nontender to palpation. No guarding or rigidity. No palpable masses. No distention. No CVA tenderness. Musculoskeletal: Full range of motion to all extremities. No gross deformities appreciated. Neurologic:  Normal speech and language. No gross focal neurologic deficits are appreciated.  Skin:  Skin is warm, dry and intact. No rash noted. Psychiatric: Mood and affect are normal. Speech and behavior are normal. Patient exhibits appropriate insight and judgement.   ____________________________________________   LABS (all labs ordered are listed, but only abnormal results are displayed)  Labs Reviewed  SARS CORONAVIRUS 2 (TAT 6-24 HRS)  CULTURE, GROUP A  STREP [facility])  POCT RAPID STREP A, ED / UC   ____________________________________________  EKG   ____________________________________________  RADIOLOGY   No results found.  ____________________________________________    PROCEDURES  Procedure(s) performed:     Procedures     Medications  dexamethasone (DECADRON) 10 MG/ML injection for Pediatric ORAL use 10 mg (has no administration in time range)     ____________________________________________   INITIAL IMPRESSION / ASSESSMENT AND PLAN / ED COURSE  Pertinent labs & imaging results that were available during my care of the patient were reviewed by me and considered in my medical decision making (see chart for  details).      Assessment and plan Body aches Malaise Fever 40 year old female presents to the urgent care with chills, pharyngitis, body aches and occasional cough.  Vital signs are reassuring at triage.  Group A strep and COVID-19.  Group A strep testing was negative.  Patient was given Decadron in the urgent care for pharyngitis.  Recommended rest and hydration at home.  Tylenol and ibuprofen alternating were recommended for fever and body aches.  All patient questions were answered.   ____________________________________________  FINAL CLINICAL IMPRESSION(S) / ED DIAGNOSES  Final diagnoses:  Pharyngitis, unspecified etiology      NEW MEDICATIONS STARTED DURING THIS VISIT:  ED Discharge Orders     None           This chart was dictated using voice recognition software/Dragon. Despite best efforts to proofread, errors can occur which can change the meaning. Any change was purely unintentional.     Orvil Feil, [place] 07/01/20 [phone]

## 2020-07-01 NOTE — ED Triage Notes (Signed)
Pt in with c/o headache, chills, ST x 2 days   Pt has been taking ibuprofen with no relief

## 2020-07-01 NOTE — ED Notes (Signed)
Pt presents for COVID re swab per lab.

## 2020-07-02 LAB — SARS CORONAVIRUS 2 (TAT 6-24 HRS): SARS Coronavirus 2: POSITIVE — AB

## 2020-07-03 LAB — CULTURE, GROUP A STREP (THRC)

## 2020-09-12 ENCOUNTER — Ambulatory Visit (INDEPENDENT_AMBULATORY_CARE_PROVIDER_SITE_OTHER): Payer: BLUE CROSS/BLUE SHIELD | Admitting: Obstetrics

## 2020-09-12 ENCOUNTER — Other Ambulatory Visit (HOSPITAL_COMMUNITY)
Admission: RE | Admit: 2020-09-12 | Discharge: 2020-09-12 | Disposition: A | Discharge status: Home / Self Care | Payer: BLUE CROSS/BLUE SHIELD | Source: Ambulatory Visit | Attending: Obstetrics | Admitting: Obstetrics

## 2020-09-12 ENCOUNTER — Encounter: Payer: Self-pay | Admitting: Obstetrics

## 2020-09-12 ENCOUNTER — Other Ambulatory Visit: Payer: Self-pay

## 2020-09-12 VITALS — BP 120/81 | HR 85 | Wt 124.0 lb

## 2020-09-12 DIAGNOSIS — N3289 Other specified disorders of bladder: Secondary | ICD-10-CM

## 2020-09-12 DIAGNOSIS — B373 Candidiasis of vulva and vagina: Secondary | ICD-10-CM | POA: Diagnosis not present

## 2020-09-12 DIAGNOSIS — L309 Dermatitis, unspecified: Secondary | ICD-10-CM

## 2020-09-12 DIAGNOSIS — Z113 Encounter for screening for infections with a predominantly sexual mode of transmission: Secondary | ICD-10-CM | POA: Diagnosis not present

## 2020-09-12 DIAGNOSIS — Z0000 Encounter for general adult medical examination without abnormal findings: Secondary | ICD-10-CM

## 2020-09-12 DIAGNOSIS — R300 Dysuria: Secondary | ICD-10-CM

## 2020-09-12 DIAGNOSIS — R102 Pelvic and perineal pain: Secondary | ICD-10-CM

## 2020-09-12 DIAGNOSIS — N3281 Overactive bladder: Secondary | ICD-10-CM

## 2020-09-12 DIAGNOSIS — Z131 Encounter for screening for diabetes mellitus: Secondary | ICD-10-CM

## 2020-09-12 DIAGNOSIS — N898 Other specified noninflammatory disorders of vagina: Secondary | ICD-10-CM | POA: Diagnosis present

## 2020-09-12 DIAGNOSIS — B3731 Acute candidiasis of vulva and vagina: Secondary | ICD-10-CM

## 2020-09-12 DIAGNOSIS — N3001 Acute cystitis with hematuria: Secondary | ICD-10-CM

## 2020-09-12 DIAGNOSIS — Z1239 Encounter for other screening for malignant neoplasm of breast: Secondary | ICD-10-CM

## 2020-09-12 LAB — POCT URINALYSIS DIPSTICK
Bilirubin, UA: NEGATIVE
Glucose, UA: NEGATIVE
Ketones, UA: NEGATIVE
Leukocytes, UA: NEGATIVE
Nitrite, UA: NEGATIVE
Protein, UA: NEGATIVE
Spec Grav, UA: 1.02 (ref 1.010–1.025)
Urobilinogen, UA: 0.2 E.U./dL
pH, UA: 6.5 (ref 5.0–8.0)

## 2020-09-12 MED ORDER — PNV-SELECT 27-0.6-0.4 MG PO TABS: 1.0000 | ORAL_TABLET | Freq: Every day | ORAL | 11 refills | Status: DC

## 2020-09-12 MED ORDER — OXYCODONE-ACETAMINOPHEN 5-325 MG PO TABS: 1.0000 | ORAL_TABLET | Freq: Four times a day (QID) | ORAL | 0 refills | Status: DC | PRN

## 2020-09-12 MED ORDER — CLOTRIMAZOLE-BETAMETHASONE 1-0.05 % EX CREA: 1.0000 "application " | TOPICAL_CREAM | Freq: Two times a day (BID) | CUTANEOUS | 1 refills | Status: DC

## 2020-09-12 MED ORDER — FLUCONAZOLE 200 MG PO TABS: 200.0000 mg | ORAL_TABLET | ORAL | 2 refills | Status: DC

## 2020-09-12 NOTE — Progress Notes (Addendum)
Patient ID: Christine Brooks, female   DOB: 1980-03-14, 40 y.o.   MRN: [id number]  Chief Complaint  Patient presents with   Vaginitis    HPI Christine Brooks is a 40 y.o. female.  Complains of vaginal discharge with itching.  Also c/o rash on buttocks.  HPI  Past Medical History:  Diagnosis Date   Bladder mass    Dysuria    Endometriosis    Endometriosis, bladder    Hematuria    History of 2019 novel coronavirus disease (COVID-19) 08/24/2019   positive covid test result in epic, per pt mild symptoms that resolved   History of small bowel obstruction 02/2017   2019 post op TAH  w/ surgical bowel resection   Lower urinary tract symptoms (LUTS)    Nephrolithiasis    per pt nonobstructive    Past Surgical History:  Procedure Laterality Date   CESAREAN SECTION  1999;  07/ 2009   CYSTOSCOPY N/A 03/21/2020   Procedure: CYSTOSCOPY with biopsy;  Surgeon: Marguerita Beards, MD;  Location: [facility];  Service: Gynecology;  Laterality: N/A;  Total procedure time 1 hour   CYSTOSCOPY WITH URETHRAL DILATATION Right 05/2018   W/  TURBT   (right ureteral stricture)   DIAGNOSTIC LAPAROSCOPY  12/2016   DILATION AND CURETTAGE OF UTERUS  yrs ago   HEMICOLECTOMY  02/2017   fews days post op TAH for bowel obstruction   TOTAL ABDOMINAL HYSTERECTOMY  02/2017    Family History  Problem Relation Age of Onset   Healthy Mother    Ataxia Neg Hx    Chorea Neg Hx    Dementia Neg Hx    Mental retardation Neg Hx    Migraines Neg Hx    Multiple sclerosis Neg Hx    Neurofibromatosis Neg Hx    Neuropathy Neg Hx    Parkinsonism Neg Hx    Seizures Neg Hx    Stroke Neg Hx     Social History Social History   Tobacco Use   Smoking status: Former    Years: 2.00    Types: Cigarettes    Quit date: 03/17/2018    Years since quitting: 2.4   Smokeless tobacco: Never  Vaping Use   Vaping Use: Never used  Substance Use Topics   Alcohol use: Not Currently    Alcohol/week: 0.0 standard  drinks    Comment: rare   Drug use: Never    No Known Allergies  Current Outpatient Medications  Medication Sig Dispense Refill   norethindrone-ethinyl estradiol (LOESTRIN 1/20, 21,) 1-20 MG-MCG tablet Take 1 tablet by mouth daily. Do NOT take inactive pills. 3 tablet 4   oxybutynin (DITROPAN-XL) 5 MG 24 hr tablet Take 1 tablet (5 mg total) by mouth daily. 30 tablet 11   No current facility-administered medications for this visit.    Review of Systems Review of Systems Constitutional: negative for fatigue and weight loss Respiratory: negative for cough and wheezing Cardiovascular: negative for chest pain, fatigue and palpitations Gastrointestinal: negative for abdominal pain and change in bowel habits Genitourinary: positive for vaginal discharge and itching, and rash on buttocks Integument/breast: negative for nipple discharge Musculoskeletal:negative for myalgias Neurological: negative for gait problems and tremors Behavioral/Psych: negative for abusive relationship, depression Endocrine: negative for temperature intolerance      Blood pressure 120/81, pulse 85, weight 124 lb (56.2 kg), last menstrual period 08/11/2016.  Physical Exam Physical Exam General:   Alert and no distres  Skin:   no rash  or abnormalities  Lungs:   clear to auscultation bilaterally  Heart:   regular rate and rhythm, S1, S2 normal, no murmur, click, rub or gallop  Breasts:   normal without suspicious masses, skin or nipple changes or axillary nodes  Abdomen:  normal findings: no organomegaly, soft, non-tender and no hernia  Pelvis:  External genitalia: normal general appearance Urinary system: urethral meatus normal and bladder without fullness, nontender Vaginal: normal without tenderness, induration or masses Cervix: absent Adnexa: normal bimanual exam Uterus: absent                Buttocks: fungal type rash on inner buttocks-perianal area   I have spent a total of 20 minutes of face-to-face  time, excluding clinical staff time, reviewing notes and preparing to see patient, ordering tests and/or medications, and counseling the patient.   Data Reviewed Labs  Assessment     1. Vaginal discharge Rx: - Cervicovaginal ancillary only( [place])  2. Vaginal irritation - probable candida  3. Candida vagin:itis Rx: - fluconazole (DIFLUCAN) 200 MG tablet; Take 1 tablet (200 mg total) by mouth every 3 (three) days.  Dispense: 3 tablet; Refill: 2  4. Screening for STD (sexually transmitted disease) Rx: - HIV Antibody (routine testing w rflx) - Hepatitis B surface antigen - RPR - Hepatitis C antibody  5. Dermatitis Rx: - clotrimazole-betamethasone (LOTRISONE) cream; Apply 1 application topically 2 (two) times daily.  Dispense: 45 g; Refill: 1  6. Routine adult health maintenance Rx: - Comprehensive metabolic panel - TSH - Ferritin - CBC - Prenatal Vit w/Fe-Methylfol-FA (PNV-SELECT) 27-0.6-0.4 MG TABS; Take 1 tablet by mouth daily before breakfast.  Dispense: 30 tablet; Refill: 11  7. Screening for diabetes mellitus Rx: - Hemoglobin A1c  8. Screening breast examination Rx: - MM Digital Screening; Future  9. Overactive bladder - follow up with UroGyn  10. Hematuria due to acute cystitis Rx: - Urine Culture  11. Dysuria Rx: - POCT urinalysis dipstick  12. Pelvic pain Rx: - oxyCODONE-acetaminophen (PERCOCET/ROXICET) 5-325 MG tablet; Take 1-2 tablets by mouth every 6 (six) hours as needed for severe pain.  Dispense: 20 tablet; Refill: 0  13. Mass of urinary bladder determined by ultrasound Rx: - US PELVIC COMPLETE WITH TRANSVAGINAL; Future     Plan   Follow up in 3 months  Orders Placed This Encounter  Procedures   HIV Antibody (routine testing w rflx)   Hepatitis B surface antigen   RPR   Hepatitis C antibody   Comprehensive metabolic panel   TSH   Hemoglobin A1c   Ferritin   CBC      Brock Bad, MD 09/12/2020 2:20 PM

## 2020-09-12 NOTE — Progress Notes (Signed)
Pt states she may have yeast infection and rash on buttock area.

## 2020-09-13 LAB — HEPATITIS C ANTIBODY: Hep C Virus Ab: <0.1 s/co ratio (ref 0.0–0.9)

## 2020-09-13 LAB — COMPREHENSIVE METABOLIC PANEL
ALT: 12 IU/L (ref 0–32)
AST: 15 IU/L (ref 0–40)
Albumin/Globulin Ratio: 1.4 (ref 1.2–2.2)
Albumin: 4 g/dL (ref 3.8–4.8)
Alkaline Phosphatase: 46 IU/L (ref 44–121)
BUN/Creatinine Ratio: 16 (ref 9–23)
BUN: 13 mg/dL (ref 6–24)
Bilirubin Total: 0.2 mg/dL (ref 0.0–1.2)
CO2: 19 mmol/L — ABNORMAL LOW (ref 20–29)
Calcium: 8.9 mg/dL (ref 8.7–10.2)
Chloride: 103 mmol/L (ref 96–106)
Creatinine, Ser: 0.82 mg/dL (ref 0.57–1.00)
Globulin, Total: 2.9 g/dL (ref 1.5–4.5)
Glucose: 74 mg/dL (ref 65–99)
Potassium: 4 mmol/L (ref 3.5–5.2)
Sodium: 135 mmol/L (ref 134–144)
Total Protein: 6.9 g/dL (ref 6.0–8.5)
eGFR: 93 mL/min/{1.73_m2} (ref >59)

## 2020-09-13 LAB — RPR: RPR Ser Ql: NONREACTIVE

## 2020-09-13 LAB — CBC
Hematocrit: 34.3 % (ref 34.0–46.6)
Hemoglobin: 11.1 g/dL (ref 11.1–15.9)
MCH: 27.4 pg (ref 26.6–33.0)
MCHC: 32.4 g/dL (ref 31.5–35.7)
MCV: 85 fL (ref 79–97)
Platelets: 210 10*3/uL (ref 150–450)
RBC: 4.05 x10E6/uL (ref 3.77–5.28)
RDW: 13.1 % (ref 11.7–15.4)
WBC: 6.4 10*3/uL (ref 3.4–10.8)

## 2020-09-13 LAB — CERVICOVAGINAL ANCILLARY ONLY
Bacterial Vaginitis (gardnerella): NEGATIVE
Candida Glabrata: NEGATIVE
Candida Vaginitis: NEGATIVE
Comment: NEGATIVE
Comment: NEGATIVE
Comment: NEGATIVE
Comment: NEGATIVE
Trichomonas: NEGATIVE

## 2020-09-13 LAB — HEMOGLOBIN A1C
Est. average glucose Bld gHb Est-mCnc: 103 mg/dL
Hgb A1c MFr Bld: 5.2 % (ref 4.8–5.6)

## 2020-09-13 LAB — HIV ANTIBODY (ROUTINE TESTING W REFLEX): HIV Screen 4th Generation wRfx: NONREACTIVE

## 2020-09-13 LAB — TSH: TSH: 1.62 u[IU]/mL (ref 0.450–4.500)

## 2020-09-13 LAB — HEPATITIS B SURFACE ANTIGEN: Hepatitis B Surface Ag: NEGATIVE

## 2020-09-13 LAB — FERRITIN: Ferritin: 85 ng/mL (ref 15–150)

## 2020-09-15 LAB — URINE CULTURE

## 2020-09-21 ENCOUNTER — Ambulatory Visit
Admission: RE | Admit: 2020-09-21 | Discharge: 2020-09-21 | Disposition: A | Discharge status: Home / Self Care | Payer: BLUE CROSS/BLUE SHIELD | Source: Ambulatory Visit | Attending: Obstetrics | Admitting: Obstetrics

## 2020-09-21 ENCOUNTER — Other Ambulatory Visit: Payer: Self-pay

## 2020-09-21 ENCOUNTER — Telehealth: Payer: Self-pay | Admitting: Obstetrics and Gynecology

## 2020-09-21 ENCOUNTER — Other Ambulatory Visit: Payer: Self-pay | Admitting: Obstetrics and Gynecology

## 2020-09-21 DIAGNOSIS — N3289 Other specified disorders of bladder: Secondary | ICD-10-CM | POA: Diagnosis not present

## 2020-09-21 DIAGNOSIS — N3281 Overactive bladder: Secondary | ICD-10-CM

## 2020-09-21 MED ORDER — MIRABEGRON ER 25 MG PO TB24
25.0000 mg | ORAL_TABLET | Freq: Every day | ORAL | 5 refills | Status: DC
Start: 2020-09-21 — End: 2020-09-22

## 2020-09-21 NOTE — Telephone Encounter (Signed)
Patient feels that oxybutynin has not been helping. Requested Myrbetriq which has worked better in the past. Myrbetriq 25mg  daily prescribed. Will provide samples in the meantime until it is approved.   , MD

## 2020-09-22 ENCOUNTER — Other Ambulatory Visit: Payer: Self-pay | Admitting: Obstetrics

## 2020-09-22 DIAGNOSIS — N3289 Other specified disorders of bladder: Secondary | ICD-10-CM

## 2020-10-05 ENCOUNTER — Encounter: Payer: Self-pay | Admitting: Obstetrics

## 2020-10-05 ENCOUNTER — Telehealth: Payer: BLUE CROSS/BLUE SHIELD | Admitting: Obstetrics

## 2020-10-05 DIAGNOSIS — Z90710 Acquired absence of both cervix and uterus: Secondary | ICD-10-CM | POA: Diagnosis not present

## 2020-10-05 DIAGNOSIS — N809 Endometriosis, unspecified: Secondary | ICD-10-CM | POA: Diagnosis not present

## 2020-10-05 DIAGNOSIS — R102 Pelvic and perineal pain: Secondary | ICD-10-CM | POA: Diagnosis not present

## 2020-10-05 DIAGNOSIS — N3289 Other specified disorders of bladder: Secondary | ICD-10-CM | POA: Diagnosis not present

## 2020-10-05 MED ORDER — IBUPROFEN 800 MG PO TABS: 800.0000 mg | ORAL_TABLET | Freq: Three times a day (TID) | ORAL | 5 refills | Status: DC | PRN

## 2020-10-05 MED ORDER — OXYCODONE-ACETAMINOPHEN 5-325 MG PO TABS: 1.0000 | ORAL_TABLET | Freq: Four times a day (QID) | ORAL | 0 refills | Status: DC | PRN

## 2020-10-05 NOTE — Progress Notes (Signed)
GYNECOLOGY VIRTUAL VISIT ENCOUNTER NOTE  Provider location: [facility] for [facility]   Patient location: Home  I connected with Christine Brooks on 10/05/20 at  3:30 PM EDT by MyChart Video Encounter and verified that I am speaking with the correct person using two identifiers.   I discussed the limitations, risks, security and privacy concerns of performing an evaluation and management service virtually and the availability of in person appointments. I also discussed with the patient that there may be a patient responsible charge related to this service. The patient expressed understanding and agreed to proceed.   History:  Christine Brooks is a 40 y.o. [id number] female being evaluated today for follow up for pelvic pain.  History of endometriosis, and has a mass in urinary bladder on ultrasound exam.  She states that the pelvic pain is worsening.  She denies any abnormal vaginal discharge, bleeding, or other concerns.       Past Medical History:  Diagnosis Date   Bladder mass    Dysuria    Endometriosis    Endometriosis, bladder    Hematuria    History of 2019 novel coronavirus disease (COVID-19) 08/24/2019   positive covid test result in epic, per pt mild symptoms that resolved   History of small bowel obstruction 02/2017   2019 post op TAH  w/ surgical bowel resection   Lower urinary tract symptoms (LUTS)    Nephrolithiasis    per pt nonobstructive   Past Surgical History:  Procedure Laterality Date   CESAREAN SECTION  1999;  07/ 2009   CYSTOSCOPY N/A 03/21/2020   Procedure: CYSTOSCOPY with biopsy;  Surgeon: Marguerita Beards, MD;  Location: [facility];  Service: Gynecology;  Laterality: N/A;  Total procedure time 1 hour   CYSTOSCOPY WITH URETHRAL DILATATION Right 05/2018   W/  TURBT   (right ureteral stricture)   DIAGNOSTIC LAPAROSCOPY  12/2016   DILATION AND CURETTAGE OF UTERUS  yrs ago   HEMICOLECTOMY  02/2017   fews days post op TAH for  bowel obstruction   TOTAL ABDOMINAL HYSTERECTOMY  02/2017   The following portions of the patient's history were reviewed and updated as appropriate: allergies, current medications, past family history, past medical history, past social history, past surgical history and problem list.   Health Maintenance:  Normal pap and negative HRHPV on 2017.  Normal mammogram on 2019.   Review of Systems:  Pertinent items noted in HPI and remainder of comprehensive ROS otherwise negative.  Physical Exam:   General:  Alert, oriented and cooperative. Patient appears to be in no acute distress.  Mental Status: Normal mood and affect. Normal behavior. Normal judgment and thought content.   Respiratory: Normal respiratory effort, no problems with respiration noted  Rest of physical exam deferred due to type of encounter  Labs and Imaging No results found for this or any previous visit (from the past 336 hour(s)). US PELVIC COMPLETE WITH TRANSVAGINAL  Result Date: 09/21/2020 CLINICAL DATA:  Recurrent dysuria, history of bladder mass, hysterectomy, history of endometriosis invading the urinary bladder, hemicolectomy, Caesarean section, D&C EXAM: TRANSABDOMINAL AND TRANSVAGINAL ULTRASOUND OF PELVIS TECHNIQUE: Both transabdominal and transvaginal ultrasound examinations of the pelvis were performed. Transabdominal technique was performed for global imaging of the pelvis including uterus, ovaries, adnexal regions, and pelvic cul-de-sac. It was necessary to proceed with endovaginal exam following the transabdominal exam to visualize the ovaries. COMPARISON:  03/08/2020 FINDINGS: Uterus Surgically absent Endometrium Surgically absent Right ovary  Measurements: 3.3 x 1.8 x 1.6 cm = volume: 5.0 mL. Normal morphology without mass Left ovary Measurements: 2.9 x 1.9 x 2.4 cm = volume: 6.9 mL. Normal morphology without mass Other findings Trace free pelvic fluid. No adnexal masses. Incidentally noted mass at the posterior  aspect of the urinary bladder with polypoid margins, 3.2 x 2.0 x 2.0 cm, epicenter is external to the bladder wall and appearing to invade, could represent a neoplasm or endometrioma invading the urinary bladder. IMPRESSION: Post hysterectomy with normal appearing ovaries. 3.2 x 2.0 x 2.0 cm diameter mass appearing to invade the posterior aspect of the urinary bladder consistent with endometrioma as reported in history though tumors other than endometriosis could have a similar appearance; characterization and full extent of this lesion could be better characterized MR imaging with and without contrast. Electronically Signed   By: Ulyses Southward M.D.   On: 09/21/2020 13:10       Assessment and Plan:     1. Pelvic pain Rx: - oxyCODONE-acetaminophen (PERCOCET/ROXICET) 5-325 MG tablet; Take 1-2 tablets by mouth every 6 (six) hours as needed for severe pain.  Dispense: 20 tablet; Refill: 0 - ibuprofen (ADVIL) 800 MG tablet; Take 1 tablet (800 mg total) by mouth every 8 (eight) hours as needed.  Dispense: 30 tablet; Refill: 5  2. S/P TAH (total abdominal hysterectomy)  3. Mass of urinary bladder determined by ultrasound - followed by UroGyn - has appointment for follow up  4. Endometriosis determined by laparoscopy        I discussed the assessment and treatment plan with the patient. The patient was provided an opportunity to ask questions and all were answered. The patient agreed with the plan and demonstrated an understanding of the instructions.   The patient was advised to call back or seek an in-person evaluation/go to the ED if the symptoms worsen or if the condition fails to improve as anticipated.  I have spent a total of 15 minutes of non-face-to-face time, excluding clinical staff time, reviewing notes and preparing to see patient, ordering tests and/or medications, and counseling the patient.    Coral Ceo, MD [facility] for [facility], [facility]  10/05/20

## 2020-10-05 NOTE — Progress Notes (Signed)
Needs a note for work, needs physical restrictions.   Sharp pains in left side of stomach. Patient has endometriosis.   Requesting pain medication refill.

## 2020-10-06 ENCOUNTER — Telehealth: Payer: Self-pay

## 2020-10-06 NOTE — Telephone Encounter (Signed)
Pt called requesting letter for work accommodations. Per Dr. Clearance Coots no pushing, pulling, or lifting more than 15 pounds for 8 weeks.

## 2020-10-11 ENCOUNTER — Ambulatory Visit (HOSPITAL_COMMUNITY)
Admission: EM | Admit: 2020-10-11 | Discharge: 2020-10-11 | Disposition: A | Discharge status: Home / Self Care | Payer: BLUE CROSS/BLUE SHIELD | Attending: Student | Admitting: Student

## 2020-10-11 ENCOUNTER — Other Ambulatory Visit: Payer: Self-pay

## 2020-10-11 ENCOUNTER — Encounter (HOSPITAL_COMMUNITY): Payer: Self-pay

## 2020-10-11 DIAGNOSIS — L500 Allergic urticaria: Secondary | ICD-10-CM

## 2020-10-11 MED ORDER — TRIAMCINOLONE ACETONIDE 0.5 % EX OINT: 1.0000 "application " | TOPICAL_OINTMENT | Freq: Two times a day (BID) | CUTANEOUS | 0 refills | Status: AC

## 2020-10-11 MED ORDER — METHYLPREDNISOLONE SODIUM SUCC 125 MG IJ SOLR
INTRAMUSCULAR | Status: AC
  Filled 2020-10-11: qty 2

## 2020-10-11 MED ORDER — METHYLPREDNISOLONE SODIUM SUCC 125 MG IJ SOLR
60.0000 mg | Freq: Once | INTRAMUSCULAR | Status: AC
  Administered 2020-10-11: 60 mg via INTRAMUSCULAR

## 2020-10-11 MED ORDER — PREDNISONE 20 MG PO TABS: 40.0000 mg | ORAL_TABLET | Freq: Every day | ORAL | 0 refills | Status: AC

## 2020-10-11 NOTE — Discharge Instructions (Addendum)
-  Prednisone, 2 pills taken at the same time for 3 days in a row.  Try taking this earlier in the day as it can give you energy.  -Triamcinolone ointment 2x daily while symptoms persist -Benedryl (diphenhydramine) 25-50mg  (1-2 pills) as needed for itching, up to every 6 hours.  This medication will cause drowsiness.

## 2020-10-11 NOTE — ED Triage Notes (Signed)
Pt presents with allergic reaction allover body from a new bath soak she used a few days ago.

## 2020-10-11 NOTE — ED Provider Notes (Signed)
[facility]    CSN: [id number] Arrival date & time: 10/11/20  8299      History   Chief Complaint Chief Complaint  Patient presents with   Allergic Reaction    HPI Christine Brooks is a 40 y.o. female presenting with allergic urticaria for 3 days following new bath bomb.  Medical history noncontributory.  States she has been trying cortisone cream and Benadryl with minimal relief.  Describes rash as "everywhere", and very pruritic.  Denies shortness of breath, trouble swallowing, sensation of throat closing, dizziness.  HPI  Past Medical History:  Diagnosis Date   Bladder mass    Dysuria    Endometriosis    Endometriosis, bladder    Hematuria    History of 2019 novel coronavirus disease (COVID-19) 08/24/2019   positive covid test result in epic, per pt mild symptoms that resolved   History of small bowel obstruction 02/2017   2019 post op TAH  w/ surgical bowel resection   Lower urinary tract symptoms (LUTS)    Nephrolithiasis    per pt nonobstructive    Patient Active Problem List   Diagnosis Date Noted   Pelvic pressure in female 12/22/2015   Episodic tension-type headache, not intractable 08/16/2015   Occipital neuralgia of left side 04/20/2014   Headache(784.0) 02/25/2013    Past Surgical History:  Procedure Laterality Date   CESAREAN SECTION  1999;  07/ 2009   CYSTOSCOPY N/A 03/21/2020   Procedure: CYSTOSCOPY with biopsy;  Surgeon: Marguerita Beards, MD;  Location: [facility];  Service: Gynecology;  Laterality: N/A;  Total procedure time 1 hour   CYSTOSCOPY WITH URETHRAL DILATATION Right 05/2018   W/  TURBT   (right ureteral stricture)   DIAGNOSTIC LAPAROSCOPY  12/2016   DILATION AND CURETTAGE OF UTERUS  yrs ago   HEMICOLECTOMY  02/2017   fews days post op TAH for bowel obstruction   TOTAL ABDOMINAL HYSTERECTOMY  02/2017    OB History     Gravida  3   Para  2   Term  2   Preterm      AB  1   Living  2      SAB       IAB  1   Ectopic      Multiple      Live Births  2            Home Medications    Prior to Admission medications   Medication Sig Start Date End Date Taking? Authorizing Provider  predniSONE (DELTASONE) 20 MG tablet Take 2 tablets (40 mg total) by mouth daily for 3 days. Take with breakfast or lunch. Avoid NSAIDs (ibuprofen, etc) while taking this medication. 10/11/20 10/14/20 Yes Rhys Martini, PA-C  triamcinolone ointment (KENALOG) 0.5 % Apply 1 application topically 2 (two) times daily for 7 days. Avoid face and genitals 10/11/20 10/18/20 Yes Rhys Martini, PA-C  ibuprofen (ADVIL) 800 MG tablet Take 1 tablet (800 mg total) by mouth every 8 (eight) hours as needed. 10/05/20   Brock Bad, MD  norethindrone-ethinyl estradiol (LOESTRIN 1/20, 21,) 1-20 MG-MCG tablet Take 1 tablet by mouth daily. Do NOT take inactive pills. 03/23/20   Willodean Rosenthal, MD  oxyCODONE-acetaminophen (PERCOCET/ROXICET) 5-325 MG tablet Take 1-2 tablets by mouth every 6 (six) hours as needed for severe pain. 10/05/20   Brock Bad, MD  Prenatal Vit w/Fe-Methylfol-FA (PNV-SELECT) 27-0.6-0.4 MG TABS Take 1 tablet by mouth daily before breakfast. 09/12/20  Brock Bad, MD  Vibegron (GEMTESA) 75 MG TABS Take 1 tablet by mouth daily. 09/22/20   Marguerita Beards, MD    Family History Family History  Problem Relation Age of Onset   Healthy Mother    Ataxia Neg Hx    Chorea Neg Hx    Dementia Neg Hx    Mental retardation Neg Hx    Migraines Neg Hx    Multiple sclerosis Neg Hx    Neurofibromatosis Neg Hx    Neuropathy Neg Hx    Parkinsonism Neg Hx    Seizures Neg Hx    Stroke Neg Hx     Social History Social History   Tobacco Use   Smoking status: Former    Years: 2.00    Types: Cigarettes    Quit date: 03/17/2018    Years since quitting: 2.5   Smokeless tobacco: Never  Vaping Use   Vaping Use: Never used  Substance Use Topics   Alcohol use: Not Currently     Alcohol/week: 0.0 standard drinks    Comment: rare   Drug use: Never     Allergies   Patient has no known allergies.   Review of Systems Review of Systems  Skin:  Positive for rash.  All other systems reviewed and are negative.   Physical Exam Triage Vital Signs ED Triage Vitals  Enc Vitals Group     BP 10/11/20 0838 125/64     Pulse Rate 10/11/20 0838 96     Resp 10/11/20 0838 20     Temp 10/11/20 0838 98.4 F (36.9 C)     Temp Source 10/11/20 0838 Oral     SpO2 10/11/20 0838 94 %     Weight --      Height --      Head Circumference --      Peak Flow --      Pain Score 10/11/20 0841 0     Pain Loc --      Pain Edu? --      Excl. in GC? --    No data found.  Updated Vital Signs BP 125/64 (BP Location: Left Arm)   Pulse 96   Temp 98.4 F (36.9 C) (Oral)   Resp 20   LMP 08/11/2016 (Exact Date)   SpO2 94%   Visual Acuity Right Eye Distance:   Left Eye Distance:   Bilateral Distance:    Right Eye Near:   Left Eye Near:    Bilateral Near:     Physical Exam Vitals reviewed.  Constitutional:      General: She is not in acute distress.    Appearance: Normal appearance. She is not ill-appearing or diaphoretic.  HENT:     Head: Normocephalic and atraumatic.  Cardiovascular:     Rate and Rhythm: Normal rate and regular rhythm.     Heart sounds: Normal heart sounds.  Pulmonary:     Effort: Pulmonary effort is normal.     Breath sounds: Normal breath sounds.  Skin:    General: Skin is warm.     Comments: Diffuse urticarial rash on arms, legs, torso. Few patches on face, without facial, lip, tongue, pharyngeal swelling. Patient oxygenating comfortably.  Neurological:     General: No focal deficit present.     Mental Status: She is alert and oriented to person, place, and time.  Psychiatric:        Mood and Affect: Mood normal.        Behavior: Behavior  normal.        Thought Content: Thought content normal.        Judgment: Judgment normal.     UC  Treatments / Results  Labs (all labs ordered are listed, but only abnormal results are displayed) Labs Reviewed - No data to display  EKG   Radiology No results found.  Procedures Procedures (including critical care time)  Medications Ordered in UC Medications  methylPREDNISolone sodium succinate (SOLU-MEDROL) 125 mg/2 mL injection 60 mg (has no administration in time range)    Initial Impression / Assessment and Plan / UC Course  I have reviewed the triage vital signs and the nursing notes.  Pertinent labs & imaging results that were available during my care of the patient were reviewed by me and considered in my medical decision making (see chart for details).     This patient is a very pleasant 40 y.o. year old female presenting with allergic urticaria x3 days. IM solumedrol, benedryl, prednisone, triamcinolone. ED return precautions discussed. Patient verbalizes understanding and agreement.     Final Clinical Impressions(s) / UC Diagnoses   Final diagnoses:  Allergic urticaria     Discharge Instructions      -Prednisone, 2 pills taken at the same time for 3 days in a row.  Try taking this earlier in the day as it can give you energy.  -Triamcinolone ointment 2x daily while symptoms persist -Benedryl (diphenhydramine) 25-50mg  (1-2 pills) as needed for itching, up to every 6 hours.  This medication will cause drowsiness.     ED Prescriptions     Medication Sig Dispense Auth. Provider   triamcinolone ointment (KENALOG) 0.5 % Apply 1 application topically 2 (two) times daily for 7 days. Avoid face and genitals 30 g Rhys Martini, PA-C   predniSONE (DELTASONE) 20 MG tablet Take 2 tablets (40 mg total) by mouth daily for 3 days. Take with breakfast or lunch. Avoid NSAIDs (ibuprofen, etc) while taking this medication. 6 tablet Rhys Martini, PA-C      PDMP not reviewed this encounter.   Rhys Martini, PA-C 10/11/20 [phone]

## 2020-10-26 ENCOUNTER — Ambulatory Visit: Payer: BLUE CROSS/BLUE SHIELD | Admitting: Obstetrics & Gynecology

## 2020-11-08 ENCOUNTER — Encounter: Payer: Self-pay | Admitting: Obstetrics and Gynecology

## 2020-11-08 ENCOUNTER — Other Ambulatory Visit: Payer: Self-pay

## 2020-11-08 ENCOUNTER — Ambulatory Visit (INDEPENDENT_AMBULATORY_CARE_PROVIDER_SITE_OTHER): Payer: BLUE CROSS/BLUE SHIELD | Admitting: Obstetrics and Gynecology

## 2020-11-08 VITALS — BP 139/93 | HR 102 | Wt 124.0 lb

## 2020-11-08 DIAGNOSIS — N3281 Overactive bladder: Secondary | ICD-10-CM

## 2020-11-08 DIAGNOSIS — N809 Endometriosis, unspecified: Secondary | ICD-10-CM

## 2020-11-08 NOTE — Progress Notes (Signed)
[facility] Return Visit  SUBJECTIVE  History of Present Illness: Christine Brooks is a 40 y.o. female seen in follow-up for endometriosis of the bladder and overactive bladder. Has been on OCP suppressive therapy. For several months she had relief of her symptoms but they returned again recently. Needs to have restrictions at work due to pain.   Has been taking Gemtesa for her urinary symptoms. Stopped it for a short time due to headaches but started back up again. Still urinating every hour.    Past Medical History: Patient  has a past medical history of Bladder mass, Dysuria, Endometriosis, Endometriosis, bladder, Hematuria, History of 2019 novel coronavirus disease (COVID-19) (08/24/2019), History of small bowel obstruction (02/2017), Lower urinary tract symptoms (LUTS), and Nephrolithiasis.   Past Surgical History: She  has a past surgical history that includes Cesarean section (1999;  07/ 2009); Cystoscopy with urethral dilatation (Right, 05/2018); Total abdominal hysterectomy (02/2017); Diagnostic laparoscopy (12/2016); Hemicolectomy (02/2017); Dilation and curettage of uterus (yrs ago); and Cystoscopy (N/A, 03/21/2020).   Medications: She has a current medication list which includes the following prescription(s): ibuprofen, norethindrone-ethinyl estradiol, oxycodone-acetaminophen, pnv-select, and gemtesa.   Allergies: Patient has No Known Allergies.   Social History: Patient  reports that she quit smoking about 2 years ago. Her smoking use included cigarettes. She has never used smokeless tobacco. She reports that she does not currently use alcohol. She reports that she does not use drugs.      OBJECTIVE     Physical Exam: Vitals:   11/08/20 1135  BP: (!) 139/93  Pulse: (!) 102  Weight: 124 lb (56.2 kg)   Gen: No apparent distress, A&O x 3.  Detailed Urogynecologic Evaluation:  Deferred.    ASSESSMENT AND PLAN    Ms. Goldwasser is a 40 y.o. with:  1.  Endometriosis   2. Overactive bladder    Endometriosis - We discussed that since her symptoms returned with hormonal suppression that she may want to consider surgical resection. It is likely she has endo implants in other areas other than her bladder.  - Will refer to [facility] (Dr Everlene Other) for further treatment, possible surgical resection. We also discussed the possibility of oophorectomy.   2. OAB - Some improvement with Gemtesa when she was on it previously, recently restarted again. Refill provided in Sept. Continue for now.  - We discussed that some of her symptoms may improve if she has resection of endo.   Return as needed  Marguerita Beards, MD   Time spent: I spent 20 minutes dedicated to the care of this patient on the date of this encounter to include pre-visit review of records, face-to-face time with the patient and post visit documentation.

## 2020-11-16 ENCOUNTER — Ambulatory Visit (INDEPENDENT_AMBULATORY_CARE_PROVIDER_SITE_OTHER): Payer: BLUE CROSS/BLUE SHIELD | Admitting: Obstetrics

## 2020-11-16 ENCOUNTER — Other Ambulatory Visit: Payer: Self-pay

## 2020-11-16 ENCOUNTER — Encounter: Payer: Self-pay | Admitting: Obstetrics

## 2020-11-16 ENCOUNTER — Other Ambulatory Visit (HOSPITAL_COMMUNITY)
Admission: RE | Admit: 2020-11-16 | Discharge: 2020-11-16 | Disposition: A | Discharge status: Home / Self Care | Payer: BLUE CROSS/BLUE SHIELD | Source: Ambulatory Visit | Attending: Obstetrics | Admitting: Obstetrics

## 2020-11-16 VITALS — BP 125/82 | HR 92 | Wt 125.1 lb

## 2020-11-16 DIAGNOSIS — N809 Endometriosis, unspecified: Secondary | ICD-10-CM | POA: Diagnosis not present

## 2020-11-16 DIAGNOSIS — N3289 Other specified disorders of bladder: Secondary | ICD-10-CM

## 2020-11-16 DIAGNOSIS — N898 Other specified noninflammatory disorders of vagina: Secondary | ICD-10-CM | POA: Diagnosis not present

## 2020-11-16 DIAGNOSIS — Z1239 Encounter for other screening for malignant neoplasm of breast: Secondary | ICD-10-CM

## 2020-11-16 DIAGNOSIS — Z01419 Encounter for gynecological examination (general) (routine) without abnormal findings: Secondary | ICD-10-CM | POA: Diagnosis not present

## 2020-11-16 DIAGNOSIS — Z90710 Acquired absence of both cervix and uterus: Secondary | ICD-10-CM | POA: Diagnosis not present

## 2020-11-16 NOTE — Progress Notes (Signed)
Subjective:        Christine Brooks is a 39 y.o. female here for a routine exam.  Current complaints: Pelvic pain and vaginal discharge.    Personal health questionnaire:  Is patient [name], have a family history of breast and/or ovarian cancer: no Is there a family history of uterine cancer diagnosed at age < 19, gastrointestinal cancer, urinary tract cancer, family member who is a Field seismologist syndrome-associated carrier: no Is the patient overweight and hypertensive, family history of diabetes, personal history of gestational diabetes, preeclampsia or PCOS: no Is patient over 61, have PCOS,  family history of premature CHD under age 81, diabetes, smoke, have hypertension or peripheral artery disease:  no At any time, has a partner hit, kicked or otherwise hurt or frightened you?: no Over the past 2 weeks, have you felt down, depressed or hopeless?: no Over the past 2 weeks, have you felt little interest or pleasure in doing things?:no   Gynecologic History Patient's last menstrual period was 08/11/2016 (exact date). Contraception: status post hysterectomy Last Pap: 2019. Results were: normal Last mammogram: n/a. Results were: n/a  Obstetric History OB History  Gravida Para Term Preterm AB Living  3 2 2   1 2   SAB IAB Ectopic Multiple Live Births    1     2    # Outcome Date GA Lbr Len/2nd Weight Sex Delivery Anes PTL Lv  3 IAB 06/16/14          2 Term 07/29/07    M CS-LTranv   LIV  1 Term 12/07/97 [redacted]w[redacted]d   M CS-LTranv   LIV    Past Medical History:  Diagnosis Date   Bladder mass    Dysuria    Endometriosis    Endometriosis, bladder    Hematuria    History of 2019 novel coronavirus disease (COVID-19) 08/24/2019   positive covid test result in epic, per pt mild symptoms that resolved   History of small bowel obstruction 02/2017   2019 post op TAH  w/ surgical bowel resection   Lower urinary tract symptoms (LUTS)    Nephrolithiasis    per pt nonobstructive     Past Surgical History:  Procedure Laterality Date   [place];  07/ 2009   CYSTOSCOPY N/A 03/21/2020   Procedure: CYSTOSCOPY with biopsy;  Surgeon: Jaquita Folds, MD;  Location: [facility];  Service: Gynecology;  Laterality: N/A;  Total procedure time 1 hour   CYSTOSCOPY WITH URETHRAL DILATATION Right 05/2018   W/  TURBT   (right ureteral stricture)   DIAGNOSTIC LAPAROSCOPY  12/2016   DILATION AND CURETTAGE OF UTERUS  yrs ago   HEMICOLECTOMY  02/2017   fews days post op TAH for bowel obstruction   TOTAL ABDOMINAL HYSTERECTOMY  02/2017     Current Outpatient Medications:    ibuprofen (ADVIL) 800 MG tablet, Take 1 tablet (800 mg total) by mouth every 8 (eight) hours as needed., Disp: 30 tablet, Rfl: 5   norethindrone-ethinyl estradiol (LOESTRIN 1/20, 21,) 1-20 MG-MCG tablet, Take 1 tablet by mouth daily. Do NOT take inactive pills., Disp: 3 tablet, Rfl: 4   Prenatal Vit w/Fe-Methylfol-FA (PNV-SELECT) 27-0.6-0.4 MG TABS, Take 1 tablet by mouth daily before breakfast., Disp: 30 tablet, Rfl: 11   Vibegron (GEMTESA) 75 MG TABS, Take 1 tablet by mouth daily., Disp: 30 tablet, Rfl: 5   oxyCODONE-acetaminophen (PERCOCET/ROXICET) 5-325 MG tablet, Take 1-2 tablets by mouth every 6 (six) hours as needed  for severe pain. (Patient not taking: Reported on 11/16/2020), Disp: 20 tablet, Rfl: 0 No Known Allergies  Social History   Tobacco Use   Smoking status: Former    Years: 2.00    Types: Cigarettes    Quit date: 03/17/2018    Years since quitting: 2.6   Smokeless tobacco: Never  Substance Use Topics   Alcohol use: Not Currently    Alcohol/week: 0.0 standard drinks    Comment: rare    Family History  Problem Relation Age of Onset   Healthy Mother    Ataxia Neg Hx    Chorea Neg Hx    Dementia Neg Hx    Mental retardation Neg Hx    Migraines Neg Hx    Multiple sclerosis Neg Hx    Neurofibromatosis Neg Hx    Neuropathy Neg Hx    Parkinsonism Neg Hx     Seizures Neg Hx    Stroke Neg Hx       Review of Systems  Constitutional: negative for fatigue and weight loss Respiratory: negative for cough and wheezing Cardiovascular: negative for chest pain, fatigue and palpitations Gastrointestinal: negative for abdominal pain and change in bowel habits Musculoskeletal:negative for myalgias Neurological: negative for gait problems and tremors Behavioral/Psych: negative for abusive relationship, depression Endocrine: negative for temperature intolerance    Genitourinary: positive for vaginal discharge and pelvic pain.  negative for abnormal menstrual periods, genital lesions, hot flashes, sexual problems  Integument/breast: negative for breast lump, breast tenderness, nipple discharge and skin lesion(s)    Objective:       BP 125/82   Pulse 92   Wt 125 lb 1.6 oz (56.7 kg)   LMP 08/11/2016 (Exact Date)   BMI 24.43 kg/m  General:   Alert and no distress  Skin:   no rash or abnormalities  Lungs:   clear to auscultation bilaterally  Heart:   regular rate and rhythm, S1, S2 normal, no murmur, click, rub or gallop  Breasts:   normal without suspicious masses, skin or nipple changes or axillary nodes  Abdomen:  normal findings: no organomegaly, soft, non-tender and no hernia  Pelvis:  External genitalia: normal general appearance Urinary system: urethral meatus normal and bladder without fullness, nontender Vaginal: normal without tenderness, induration or masses Cervix: absent Adnexa: bilateral adnexal tenderness, no masses Uterus: absent   Lab Review Urine pregnancy test Labs reviewed yes Radiologic studies reviewed yes  I have spent a total of 20 minutes of face-to-face time, excluding clinical staff time, reviewing notes and preparing to see patient, ordering tests and/or medications, and counseling the patient.   Assessment:    1. Encounter for annual routine gynecological examination  2. S/P TAH (total abdominal  hysterectomy)  3. Endometriosis determined by laparoscopy  4. Mass of urinary bladder determined by ultrasound - cystoscopic biopsies revealed endometriosis  5. Vaginal discharge - Cervicovaginal ancillary only  6. Screening breast examination Rx: - MM Digital Screening; Future     Plan:    Education reviewed: calcium supplements, depression evaluation, low fat, low cholesterol diet, safe sex/STD prevention, self breast exams, and weight bearing exercise. Mammogram ordered. Follow up in: 1 year.    Orders Placed This Encounter  Procedures   MM Digital Screening    Standing Status:   Future    Standing Expiration Date:   11/16/2021    Order Specific Question:   Reason for Exam (SYMPTOM  OR DIAGNOSIS REQUIRED)    Answer:   Screening    Order Specific  Question:   Is the patient pregnant?    Answer:   No    Order Specific Question:   Preferred imaging location?    Answer:   [facility]     Brock Bad, MD 11/16/2020 12:37 PM

## 2020-11-16 NOTE — Progress Notes (Signed)
Patient is here for annual

## 2020-11-17 ENCOUNTER — Other Ambulatory Visit: Payer: Self-pay

## 2020-11-17 DIAGNOSIS — N809 Endometriosis, unspecified: Secondary | ICD-10-CM

## 2020-11-17 LAB — CERVICOVAGINAL ANCILLARY ONLY
Bacterial Vaginitis (gardnerella): NEGATIVE
Candida Glabrata: NEGATIVE
Candida Vaginitis: NEGATIVE
Comment: NEGATIVE
Comment: NEGATIVE
Comment: NEGATIVE
Comment: NEGATIVE
Trichomonas: NEGATIVE

## 2020-11-17 MED ORDER — NORETHINDRONE ACET-ETHINYL EST 1-20 MG-MCG PO TABS
1.0000 | ORAL_TABLET | Freq: Every day | ORAL | 4 refills | Status: DC
Start: 2020-11-17 — End: 2021-12-14

## 2020-11-17 NOTE — Progress Notes (Signed)
Patient called for refill of birth control. Patient states that she needs refill on birth control. Patient was just evaluated by Dr. Clearance Coots yesterday at [facility]. Sent in birth control refill and made patient aware that she will need to call them for future refills since she is a patient of [facility]. Armandina Stammer RN

## 2020-11-24 ENCOUNTER — Telehealth: Payer: Self-pay | Admitting: *Deleted

## 2020-11-24 ENCOUNTER — Encounter: Payer: Self-pay | Admitting: Obstetrics

## 2020-11-24 NOTE — Telephone Encounter (Signed)
TC from patient requesting letter to return to work without restriction. Previous restrictions on lifting and pulling due to pain from endometriosis. Patient states her employer is unable to accommodate previous restrictions due to type of employment status. Support offered to patient. Letter for return to work without restrictions sent via MyChart per patient's request.

## 2020-11-28 ENCOUNTER — Telehealth: Payer: Self-pay

## 2020-11-28 ENCOUNTER — Other Ambulatory Visit: Payer: Self-pay | Admitting: Obstetrics and Gynecology

## 2020-11-28 DIAGNOSIS — N80A0 Endometriosis of bladder, unspecified depth: Secondary | ICD-10-CM

## 2020-11-28 NOTE — Telephone Encounter (Signed)
Pt called requesting referral to pain management from Dr. Clearance Coots for endometriosis. Advised patient Dr. Clearance Coots was out of the office and would not be back until next week. Will send to Dr. Jolayne Panther to see if she is ok with referral. Please advise.

## 2020-12-19 ENCOUNTER — Telehealth: Payer: Self-pay | Admitting: Obstetrics

## 2020-12-19 NOTE — Telephone Encounter (Signed)
Patient called to check on the status of her pain management referral.  In checking the referral was denied by the receiving provider stating they do not treat endometriosis.   Patient offered a MyChart visit with Dr. Clearance Coots. Patient is requesting a refill.   Patient has an appointment with [facility] in January for surgery consultation, she is requesting one last refill of pain medication to get her thru til her January appointment.   Routing to provider for approval.

## 2020-12-21 ENCOUNTER — Other Ambulatory Visit: Payer: Self-pay | Admitting: Obstetrics

## 2020-12-21 DIAGNOSIS — R102 Pelvic and perineal pain: Secondary | ICD-10-CM

## 2020-12-21 MED ORDER — OXYCODONE-ACETAMINOPHEN 5-325 MG PO TABS: 1.0000 | ORAL_TABLET | Freq: Four times a day (QID) | ORAL | 0 refills | Status: DC | PRN

## 2021-01-10 ENCOUNTER — Encounter: Payer: Self-pay | Admitting: Physical Medicine and Rehabilitation

## 2021-02-02 ENCOUNTER — Other Ambulatory Visit: Payer: Self-pay

## 2021-02-02 ENCOUNTER — Encounter
Payer: BLUE CROSS/BLUE SHIELD | Attending: Physical Medicine and Rehabilitation | Admitting: Physical Medicine and Rehabilitation

## 2021-02-02 VITALS — BP 113/76 | HR 84 | Temp 98.8°F | Ht 60.0 in | Wt 127.0 lb

## 2021-02-02 DIAGNOSIS — Z5181 Encounter for therapeutic drug level monitoring: Secondary | ICD-10-CM

## 2021-02-02 DIAGNOSIS — R1013 Epigastric pain: Secondary | ICD-10-CM | POA: Diagnosis present

## 2021-02-02 DIAGNOSIS — G894 Chronic pain syndrome: Secondary | ICD-10-CM | POA: Diagnosis present

## 2021-02-02 DIAGNOSIS — Z79891 Long term (current) use of opiate analgesic: Secondary | ICD-10-CM

## 2021-02-02 DIAGNOSIS — G8929 Other chronic pain: Secondary | ICD-10-CM

## 2021-02-02 NOTE — Patient Instructions (Signed)
-  Foods that may reduce pain 1) Ginger (especially studied for arthritis)- reduce leukotriene production to decrease inflammation 2) Blueberries- high in phytonutrients that decrease inflammation 3) Salmon- marine omega-3s reduce joint swelling and pain 4) Pumpkin seeds- reduce inflammation 5) dark chocolate- reduces inflammation 6) turmeric- reduces inflammation 7) tart cherries - reduce pain and stiffness 8) extra virgin olive oil - its compound olecanthal helps to block prostaglandins  9) chili peppers- can be eaten or applied topically via capsaicin 10) mint- helpful for headache, muscle aches, joint pain, and itching 11) garlic- reduces inflammation 

## 2021-02-02 NOTE — Progress Notes (Signed)
Subjective:    Patient ID: Christine Brooks, female    DOB: 02/23/1980, 41 y.o.   MRN: [id number]  HPI Christine Brooks is a 41 year old woman who presents with chronic abdominal pain.  1) Chronic abdominal pain -her pain started 4 years ago -she was diagnosed with endometriosis. -it took a long time for her to be diagnosed -she was taking birth control to stop the spread of the endometriosis and she is not sure whether it helped. -on her last cystoscopy she had no lesions.  -she was taking ibuprofen and oxycodone. She takes both together. She takes this combination about one per day. She tries not to take this.  -the pain feels sharp, like contractions.  -she has MRI scheduled -she has 2 children and cannot have any more due to her partial hysterectomy.  -has not been sexually active for years due to the pain. -it just came up on her all of a sudden, she had never even heard of endometriosis.  -her goal is pain control -she has a lot of scar tissue  2) Depression -she developed depression due to her pain and how it is effecting her quality of life. -she finds that now her mind is in a better place since she gave her life to [facility]  Pain Inventory Average Pain 10 Pain Right Now 0 My pain is intermittent, sharp, burning, and stabbing  In the last 24 hours, has pain interfered with the following? General activity 8 Relation with others 8 Enjoyment of life 8 What TIME of day is your pain at its worst? varies Sleep (in general) Fair  Pain is worse with: unsure Pain improves with: medication Relief from Meds: 10  how many minutes can you walk? unlimited ability to climb steps?  yes do you drive?  yes  employed # of hrs/week 40  bladder control problems  New pt  New pt    Family History  Problem Relation Age of Onset   Healthy Mother    Ataxia Neg Hx    Chorea Neg Hx    Dementia Neg Hx    Mental retardation Neg Hx    Migraines Neg Hx    Multiple sclerosis Neg Hx     Neurofibromatosis Neg Hx    Neuropathy Neg Hx    Parkinsonism Neg Hx    Seizures Neg Hx    Stroke Neg Hx    Social History   Socioeconomic History   Marital status: Single    Spouse name: Not on file   Number of children: 2   Years of education: Not on file   Highest education level: Some college, no degree  Occupational History   Not on file  Tobacco Use   Smoking status: Former    Years: 2.00    Types: Cigarettes    Quit date: 03/17/2018    Years since quitting: 2.8   Smokeless tobacco: Never  Vaping Use   Vaping Use: Never used  Substance and Sexual Activity   Alcohol use: Not Currently    Alcohol/week: 0.0 standard drinks    Comment: rare   Drug use: Never   Sexual activity: Not Currently    Partners: Male  Other Topics Concern   Not on file  Social History Narrative   Not on file   Social Determinants of Health   Financial Resource Strain: Not on file  Food Insecurity: Not on file  Transportation Needs: No Transportation Needs   Lack of Transportation (Medical): No  Lack of Transportation (Non-Medical): No  Physical Activity: Not on file  Stress: Not on file  Social Connections: Not on file   Past Surgical History:  Procedure Laterality Date   CESAREAN SECTION  1999;  07/ 2009   CYSTOSCOPY N/A 03/21/2020   Procedure: CYSTOSCOPY with biopsy;  Surgeon: Marguerita Beards, MD;  Location: [facility];  Service: Gynecology;  Laterality: N/A;  Total procedure time 1 hour   CYSTOSCOPY WITH URETHRAL DILATATION Right 05/2018   W/  TURBT   (right ureteral stricture)   DIAGNOSTIC LAPAROSCOPY  12/2016   DILATION AND CURETTAGE OF UTERUS  yrs ago   HEMICOLECTOMY  02/2017   fews days post op TAH for bowel obstruction   TOTAL ABDOMINAL HYSTERECTOMY  02/2017   Past Medical History:  Diagnosis Date   Bladder mass    Dysuria    Endometriosis    Endometriosis, bladder    Hematuria    History of 2019 novel coronavirus disease (COVID-19) 08/24/2019    positive covid test result in epic, per pt mild symptoms that resolved   History of small bowel obstruction 02/2017   2019 post op TAH  w/ surgical bowel resection   Lower urinary tract symptoms (LUTS)    Nephrolithiasis    per pt nonobstructive   BP 113/76    Pulse 84    Temp 98.8 F (37.1 C)    Ht 5' (1.524 m)    Wt 127 lb (57.6 kg)    LMP 08/11/2016 (Exact Date)    SpO2 99%    BMI 24.80 kg/m   Opioid Risk Score:   Fall Risk Score:  `1  Depression screen PHQ 2/9  Depression screen PHQ 2/9 02/02/2021  Decreased Interest 3  Down, Depressed, Hopeless 0  PHQ - 2 Score 3  Altered sleeping 3  Tired, decreased energy 2  Change in appetite 0  Feeling bad or failure about yourself  0  Trouble concentrating 2  Moving slowly or fidgety/restless 0  Suicidal thoughts 0  PHQ-9 Score 10  Difficult doing work/chores Very difficult      Review of Systems  Gastrointestinal:  Positive for abdominal pain.  Genitourinary:  Positive for pelvic pain.  All other systems reviewed and are negative.     Objective:   Physical Exam  Gen: no distress, normal appearing HEENT: oral mucosa pink and moist, NCAT Cardio: Reg rate Chest: normal effort, normal rate of breathing Abd: abdominal tenderness Ext: no edema Psych: pleasant, normal affect Skin: intact Neuro: Alert and oriented x3 Musculoskeletal: Normal ambulation    Assessment & Plan:  1) Chronic abdominal pain secondary to endometriosis -Discussed current symptoms of pain and history of pain.  -Discussed benefits of exercise in reducing pain. -Pain contact and urine sample signed today -continue with plan for MRI -If urine sample contains expected metabolities, will prescribe oxycodone 1 tab per day prn for severe pain -Discussed following foods that may reduce pain: 1) Ginger (especially studied for arthritis)- reduce leukotriene production to decrease inflammation 2) Blueberries- high in phytonutrients that decrease  inflammation 3) Salmon- marine omega-3s reduce joint swelling and pain 4) Pumpkin seeds- reduce inflammation 5) dark chocolate- reduces inflammation 6) turmeric- reduces inflammation 7) tart cherries - reduce pain and stiffness 8) extra virgin olive oil - its compound olecanthal helps to block prostaglandins  9) chili peppers- can be eaten or applied topically via capsaicin 10) mint- helpful for headache, muscle aches, joint pain, and itching 11) garlic- reduces inflammation  Link  to further information on diet for chronic pain: [email or web address]   2) Depression: -discussed her prior depression, she has been able to get over this.  -encouraged her religious practice which has greatly helped her.

## 2021-02-07 LAB — TOXASSURE SELECT,+ANTIDEPR,UR

## 2021-02-08 ENCOUNTER — Other Ambulatory Visit: Payer: Self-pay | Admitting: Physical Medicine and Rehabilitation

## 2021-02-08 DIAGNOSIS — R102 Pelvic and perineal pain: Secondary | ICD-10-CM

## 2021-02-08 MED ORDER — OXYCODONE-ACETAMINOPHEN 5-325 MG PO TABS: 1.0000 | ORAL_TABLET | Freq: Every day | ORAL | 0 refills | Status: DC | PRN

## 2021-02-09 ENCOUNTER — Telehealth: Payer: Self-pay

## 2021-02-09 NOTE — Telephone Encounter (Signed)
PA submitted for Oxycodone/APAP 

## 2021-02-13 NOTE — Telephone Encounter (Signed)
Need documentation that patient has tried at least one non-opioid medication that did not adequately control pain.  Request has been denied until getting that information.

## 2021-02-13 NOTE — Telephone Encounter (Signed)
Left message for patient to call back with the information.

## 2021-02-14 ENCOUNTER — Telehealth: Payer: Self-pay | Admitting: *Deleted

## 2021-02-14 NOTE — Telephone Encounter (Signed)
Patient states she has tried Ibuprofen but she pick up the Oxycodone and insurance paid for it.

## 2021-02-14 NOTE — Telephone Encounter (Signed)
Urine drug screen for this encounter is consistent for prescribed medication 

## 2021-03-02 ENCOUNTER — Encounter: Payer: Self-pay | Admitting: Registered Nurse

## 2021-03-02 ENCOUNTER — Encounter: Payer: BLUE CROSS/BLUE SHIELD | Attending: Physical Medicine and Rehabilitation | Admitting: Registered Nurse

## 2021-03-02 ENCOUNTER — Other Ambulatory Visit: Payer: Self-pay

## 2021-03-02 VITALS — BP 114/80 | HR 92 | Ht 60.0 in | Wt 125.2 lb

## 2021-03-02 DIAGNOSIS — G894 Chronic pain syndrome: Secondary | ICD-10-CM | POA: Diagnosis not present

## 2021-03-02 DIAGNOSIS — G8929 Other chronic pain: Secondary | ICD-10-CM | POA: Diagnosis present

## 2021-03-02 DIAGNOSIS — Z79891 Long term (current) use of opiate analgesic: Secondary | ICD-10-CM | POA: Insufficient documentation

## 2021-03-02 DIAGNOSIS — R1013 Epigastric pain: Secondary | ICD-10-CM | POA: Insufficient documentation

## 2021-03-02 DIAGNOSIS — R102 Pelvic and perineal pain: Secondary | ICD-10-CM | POA: Diagnosis not present

## 2021-03-02 DIAGNOSIS — Z5181 Encounter for therapeutic drug level monitoring: Secondary | ICD-10-CM | POA: Diagnosis not present

## 2021-03-02 MED ORDER — OXYCODONE-ACETAMINOPHEN 5-325 MG PO TABS: 1.0000 | ORAL_TABLET | Freq: Every day | ORAL | 0 refills | Status: DC | PRN

## 2021-03-02 NOTE — Progress Notes (Signed)
Subjective:    Patient ID: Christine Brooks, female    DOB: [birth year], 41 y.o.   MRN: [id number]  HPI: Christine Brooks is a 41 y.o. female who returns for follow up appointment for chronic pain and medication refill. She states her  pain is located in her pelvis and reports chric abdominal pain. She  rated her pain 0. Her current exercise regime is walking and performing stretching exercises.  Ms. Luedke Morphine equivalent is 7.5 MME.   Last UDS was Performed on 02/02/2021, it was consistent.    Pain Inventory Average Pain 8 Pain Right Now 0 My pain is intermittent, sharp, and stabbing  In the last 24 hours, has pain interfered with the following? General activity 6 Relation with others 6 Enjoyment of life 6 What TIME of day is your pain at its worst? varies Sleep (in general) Good  Pain is worse with: walking, bending, sitting, inactivity, standing, and some activites Pain improves with: medication Relief from Meds: 10  Family History  Problem Relation Age of Onset   Healthy Mother    Ataxia Neg Hx    Chorea Neg Hx    Dementia Neg Hx    Mental retardation Neg Hx    Migraines Neg Hx    Multiple sclerosis Neg Hx    Neurofibromatosis Neg Hx    Neuropathy Neg Hx    Parkinsonism Neg Hx    Seizures Neg Hx    Stroke Neg Hx    Social History   Socioeconomic History   Marital status: Single    Spouse name: Not on file   Number of children: 2   Years of education: Not on file   Highest education level: Some college, no degree  Occupational History   Not on file  Tobacco Use   Smoking status: Former    Years: 2.00    Types: Cigarettes    Quit date: 03/17/2018    Years since quitting: 2.9   Smokeless tobacco: Never  Vaping Use   Vaping Use: Never used  Substance and Sexual Activity   Alcohol use: Not Currently    Alcohol/week: 0.0 standard drinks    Comment: rare   Drug use: Never   Sexual activity: Not Currently    Partners: Male  Other Topics Concern   Not on  file  Social History Narrative   Not on file   Social Determinants of Health   Financial Resource Strain: Not on file  Food Insecurity: Not on file  Transportation Needs: No Transportation Needs   Lack of Transportation (Medical): No   Lack of Transportation (Non-Medical): No  Physical Activity: Not on file  Stress: Not on file  Social Connections: Not on file   Past Surgical History:  Procedure Laterality Date   [place];  07/ 2009   CYSTOSCOPY N/A 03/21/2020   Procedure: CYSTOSCOPY with biopsy;  Surgeon: Jaquita Folds, MD;  Location: [facility];  Service: Gynecology;  Laterality: N/A;  Total procedure time 1 hour   CYSTOSCOPY WITH URETHRAL DILATATION Right 05/2018   W/  TURBT   (right ureteral stricture)   DIAGNOSTIC LAPAROSCOPY  12/2016   DILATION AND CURETTAGE OF UTERUS  yrs ago   HEMICOLECTOMY  02/2017   fews days post op TAH for bowel obstruction   TOTAL ABDOMINAL HYSTERECTOMY  02/2017   Past Surgical History:  Procedure Laterality Date   CESAREAN SECTION  1999;  07/ 2009   CYSTOSCOPY N/A 03/21/2020  Procedure: CYSTOSCOPY with biopsy;  Surgeon: Jaquita Folds, MD;  Location: [facility];  Service: Gynecology;  Laterality: N/A;  Total procedure time 1 hour   CYSTOSCOPY WITH URETHRAL DILATATION Right 05/2018   W/  TURBT   (right ureteral stricture)   DIAGNOSTIC LAPAROSCOPY  12/2016   DILATION AND CURETTAGE OF UTERUS  yrs ago   HEMICOLECTOMY  02/2017   fews days post op TAH for bowel obstruction   TOTAL ABDOMINAL HYSTERECTOMY  02/2017   Past Medical History:  Diagnosis Date   Bladder mass    Dysuria    Endometriosis    Endometriosis, bladder    Hematuria    History of 2019 novel coronavirus disease (COVID-19) 08/24/2019   positive covid test result in epic, per pt mild symptoms that resolved   History of small bowel obstruction 02/2017   2019 post op TAH  w/ surgical bowel resection   Lower urinary tract  symptoms (LUTS)    Nephrolithiasis    per pt nonobstructive   BP 114/80    Pulse 92    Ht 5' (1.524 m)    Wt 125 lb 3.2 oz (56.8 kg)    LMP 08/11/2016 (Exact Date)    SpO2 99%    BMI 24.45 kg/m   Opioid Risk Score:   Fall Risk Score:  `1  Depression screen PHQ 2/9  Depression screen PHQ 2/9 02/02/2021  Decreased Interest 3  Down, Depressed, Hopeless 0  PHQ - 2 Score 3  Altered sleeping 3  Tired, decreased energy 2  Change in appetite 0  Feeling bad or failure about yourself  0  Trouble concentrating 2  Moving slowly or fidgety/restless 0  Suicidal thoughts 0  PHQ-9 Score 10  Difficult doing work/chores Very difficult      Review of Systems  Gastrointestinal:  Positive for abdominal pain.  Genitourinary:  Positive for flank pain and pelvic pain.  All other systems reviewed and are negative.     Objective:   Physical Exam Vitals and nursing note reviewed.  Constitutional:      Appearance: Normal appearance.  Cardiovascular:     Rate and Rhythm: Normal rate and regular rhythm.     Pulses: Normal pulses.     Heart sounds: Normal heart sounds.  Pulmonary:     Effort: Pulmonary effort is normal.     Breath sounds: Normal breath sounds.  Musculoskeletal:     Cervical back: Normal range of motion and neck supple.     Comments: Normal Muscle Bulk and Muscle Testing Reveals:  Upper Extremities: Full ROM and Muscle Strength 5/5 r Lower Extremities : Full ROM and Muscle Strength 5/5 Arises from Table with Ease Narrow Based Gait     Skin:    General: Skin is warm and dry.  Neurological:     Mental Status: She is alert and oriented to person, place, and time.  Psychiatric:        Mood and Affect: Mood normal.        Behavior: Behavior normal.         Assessment & Plan:  Chronic abdominal pain secondary to endometriosis/ Pelvic Pain: Continue current medication regimen. Refilled : Oxycodone 5mg /325 mg one tablet daily as needed for pain. #30. We will continue the  opioid monitoring program, this consists of regular clinic visits, examinations, urine drug screen, pill counts as well as use of [place] Controlled Substance Reporting system. A 12 month History has been reviewed on the [place]  Controlled Substance Reporting System on 03/02/2021 Chronic Pain Syndrome: Continue current medication . Continue HEP as Tolerated . Continue to Monitor.   F/U in 1 month

## 2021-03-20 ENCOUNTER — Telehealth: Payer: Self-pay

## 2021-03-20 NOTE — Telephone Encounter (Signed)
Christine Brooks is a 40 y.o. female called in for a new OAB medication.  ?Pt said her vaginal pain has increased and the Leslye Peer is no longer working.  ?Pt also said she has experienced vaginal bleeding on 2 occasions recently. ? ?Pt has been booked for an evaluation with Dr. Florian Buff on 03/21/2021 at 11am. ?

## 2021-03-20 NOTE — Progress Notes (Signed)
[facility] ?Return Visit ? ?SUBJECTIVE  ?History of Present Illness: ?Christine Brooks is a 41 y.o. female seen in follow-up for endometriosis of the bladder and overactive bladder.  ? ?Has burning and sharp pain in the bladder with urination. Has been taking Gemtesa for her urinary symptoms but no longer working well for her. The pain and urgency is making her feel like she always has to go.  ?Previously tried: oxybutynin and myrbetriq.  ? ?Recently seen by [facility] at [facility] (Dr Elpidio Galea) and had a pelvic MRI which showed:  ?Findings suggestive of deep infiltrating endometriosis with involvement of the left lateral bladder dome, inferior left rectus abdominus, and fascia along the right hemipelvis, as detailed above.  ? ?She is still on the OCPs. She missed a few days and needed refills. She noticed some spotting and blood in the urine. Also having increasing pain on the left side pelvis. Also started seeing pain management and taking oxycodone for pain.  ? ?Past Medical History: ?Patient  has a past medical history of Bladder mass, Dysuria, Endometriosis, Endometriosis, bladder, Hematuria, History of 2019 novel coronavirus disease (COVID-19) (08/24/2019), History of small bowel obstruction (02/2017), Lower urinary tract symptoms (LUTS), and Nephrolithiasis.  ? ?Past Surgical History: ?She  has a past surgical history that includes Cesarean section (1999;  07/ 2009); Cystoscopy with urethral dilatation (Right, 05/2018); Total abdominal hysterectomy (02/2017); Diagnostic laparoscopy (12/2016); Hemicolectomy (02/2017); Dilation and curettage of uterus (yrs ago); and Cystoscopy (N/A, 03/21/2020).  ? ?Medications: ?She has a current medication list which includes the following prescription(s): ibuprofen, norethindrone-ethinyl estradiol, oxycodone-acetaminophen, pnv-select, and gemtesa.  ? ?Allergies: ?Patient has No Known Allergies.  ? ?Social History: ?Patient  reports that she quit smoking about 3 years ago. Her  smoking use included cigarettes. She has never used smokeless tobacco. She reports that she does not currently use alcohol. She reports that she does not use drugs.  ?  ?  ?OBJECTIVE  ?  ? ?Physical Exam: ?Vitals:  ? 03/21/21 1112  ?BP: 128/84  ?Pulse: 82  ? ? ?Gen: No apparent distress, A&O x 3. ? ?Detailed Urogynecologic Evaluation:  ?Deferred.   ? ?POC urine: trace blood ?ASSESSMENT AND PLAN  ?  ?Ms. [name] is a 41 y.o. with:  ?1. Urinary frequency   ?2. Urinary urgency   ?3. Bladder pain   ? ?-We discussed she is likely having a pain flare from her endometriosis since she missed a few days of her OCPs. This is likely exacerbating her bladder pain and urgency.  ?- Tried a bladder instillation today with 85ml 2% lidocaine, 29ml 0.25% marcaine, 10,000 units heparin and 33ml sodium bicarb. If this works well for her, she can come weekly for instillations up to 6 weeks.  ?- Continue with the Gemtesa 75mg  daily.  ?- Has follow up with [facility] next week ? ?[facility], MD ? ? ?Time spent: I spent 25 minutes dedicated to the care of this patient on the date of this encounter to include pre-visit review of records, face-to-face time with the patient and post visit documentation.  ? ?

## 2021-03-21 ENCOUNTER — Other Ambulatory Visit: Payer: Self-pay

## 2021-03-21 ENCOUNTER — Encounter: Payer: Self-pay | Admitting: Obstetrics and Gynecology

## 2021-03-21 ENCOUNTER — Ambulatory Visit: Payer: BLUE CROSS/BLUE SHIELD | Admitting: Obstetrics and Gynecology

## 2021-03-21 VITALS — BP 128/84 | HR 82

## 2021-03-21 DIAGNOSIS — R3915 Urgency of urination: Secondary | ICD-10-CM | POA: Diagnosis not present

## 2021-03-21 DIAGNOSIS — R350 Frequency of micturition: Secondary | ICD-10-CM | POA: Diagnosis not present

## 2021-03-21 DIAGNOSIS — R3989 Other symptoms and signs involving the genitourinary system: Secondary | ICD-10-CM | POA: Diagnosis not present

## 2021-03-21 LAB — POCT URINALYSIS DIPSTICK
Appearance: NORMAL
Bilirubin, UA: NEGATIVE
Glucose, UA: NEGATIVE
Ketones, UA: NEGATIVE
Leukocytes, UA: NEGATIVE
Nitrite, UA: NEGATIVE
Protein, UA: NEGATIVE
Spec Grav, UA: >=1.03 — AB (ref 1.010–1.025)
Urobilinogen, UA: 0.2 E.U./dL
pH, UA: 6 (ref 5.0–8.0)

## 2021-03-21 MED ORDER — BUPIVACAINE HCL 0.25 % IJ SOLN
20.0000 mL | Freq: Once | INTRAMUSCULAR | Status: AC
  Administered 2021-03-21: 20 mL

## 2021-03-21 MED ORDER — SODIUM BICARBONATE 4.2 % IV SOLN: 2.5000 meq | Freq: Once | INTRAVENOUS | Status: AC

## 2021-03-21 MED ORDER — HEPARIN SODIUM (PORCINE) 1000 UNIT/ML IJ SOLN: 10000.0000 [IU] | Freq: Once | INTRAMUSCULAR | Status: DC

## 2021-03-21 MED ORDER — LIDOCAINE HCL 2 % IJ SOLN
20.0000 mL | Freq: Once | INTRAMUSCULAR | Status: AC
  Administered 2021-03-21: 400 mg

## 2021-03-21 MED ORDER — SODIUM BICARBONATE 8.4 % IV SOLN: 50.0000 meq | Freq: Once | INTRAVENOUS | Status: DC

## 2021-03-21 MED ORDER — HEPARIN SODIUM (PORCINE) 10000 UNIT/ML IJ SOLN
10000.0000 [IU] | Freq: Once | INTRAMUSCULAR | Status: AC
  Administered 2021-03-21: 10000 [IU] via INTRAVESICAL

## 2021-03-21 NOTE — Addendum Note (Signed)
Addended by: Marguerita Beards on: 03/21/2021 03:36 PM ? ? Modules accepted: Orders ? ?

## 2021-03-22 ENCOUNTER — Telehealth: Payer: Self-pay

## 2021-03-22 NOTE — Telephone Encounter (Signed)
PA for Oxycodone-APAP sent to insurance through CoverMyMeds ?

## 2021-03-23 NOTE — Telephone Encounter (Addendum)
this request is approved for the following time period: 03/22/2021 - 09/22/2021. Pharmacy notified. ?

## 2021-04-01 ENCOUNTER — Encounter (HOSPITAL_COMMUNITY): Payer: Self-pay | Admitting: Emergency Medicine

## 2021-04-01 ENCOUNTER — Ambulatory Visit (HOSPITAL_COMMUNITY)
Admission: EM | Admit: 2021-04-01 | Discharge: 2021-04-01 | Disposition: A | Discharge status: Home / Self Care | Payer: BLUE CROSS/BLUE SHIELD | Attending: Emergency Medicine | Admitting: Emergency Medicine

## 2021-04-01 ENCOUNTER — Other Ambulatory Visit: Payer: Self-pay

## 2021-04-01 DIAGNOSIS — J069 Acute upper respiratory infection, unspecified: Secondary | ICD-10-CM

## 2021-04-01 DIAGNOSIS — Z20822 Contact with and (suspected) exposure to covid-19: Secondary | ICD-10-CM | POA: Insufficient documentation

## 2021-04-01 MED ORDER — GUAIFENESIN ER 600 MG PO TB12: 1200.0000 mg | ORAL_TABLET | Freq: Two times a day (BID) | ORAL | 0 refills | Status: DC

## 2021-04-01 MED ORDER — FLUTICASONE PROPIONATE 50 MCG/ACT NA SUSP: 1.0000 | Freq: Every day | NASAL | 1 refills | Status: DC

## 2021-04-01 NOTE — Discharge Instructions (Signed)
Your symptoms today are most likely being caused by a virus and should steadily improve in time it can take up to 7 to 10 days before you truly start to see a turnaround however things will get better ? ?Your COVID test is pending, you will be notified of positive results ? ?Begin use of Mucinex twice daily, this medication is to help thin your secretions and allow them to drain it ? ?Begin use of Flonase twice daily, this medication is to help to reduce secretions from your sinus cavities ?   ?You can take Tylenol and/or Ibuprofen as needed for fever reduction and pain relief. ?  ?For cough: honey 1/2 to 1 teaspoon (you can dilute the honey in water or another fluid).  You can also use dextromethorphan for cough. You can use a humidifier for chest congestion and cough.  If you don't have a humidifier, you can sit in the bathroom with the hot shower running.    ?  ?For sore throat: try warm salt water gargles, cepacol lozenges, throat spray, warm tea or water with lemon/honey, popsicles or ice, or OTC cold relief medicine for throat discomfort. ?  ?For congestion: take a daily anti-histamine like Zyrtec, Claritin, and a oral decongestant, such as pseudoephedrine.  ?  ?It is important to stay hydrated: drink plenty of fluids (water, gatorade/powerade/pedialyte, juices, or teas) to keep your throat moisturized and help further relieve irritation/discomfort.  ?

## 2021-04-01 NOTE — ED Triage Notes (Signed)
Patient c/o headache and nasal congestion x 2 days.  ? ?Patient denies fever or N/V/D at home.  ? ?Patient endorses nonproductive cough at times.  ? ?Patient has taken Nyquil with some relief of symptoms.  ?

## 2021-04-01 NOTE — ED Provider Notes (Signed)
?[facility] ? ? ? ?CSN: [id number] ?Arrival date & time: 04/01/21  1004 ? ? ?  ? ?History   ?Chief Complaint ?Chief Complaint  ?Patient presents with  ? Headache  ? Nasal Congestion  ? ? ?HPI ?Christine Brooks is a 41 y.o. female.  ? ?Patient presents with nasal congestion, rhinorrhea and generalized headache for 2 days.  Possible sick contacts as she works in an area with close next [employer] to numerous amount of people.  Decreased appetite but tolerating liquids.  Has attempted use of NyQuil and Goody powders with no success.  Home COVID test negative, requesting retest.  Denies fever, chills, body aches, ear pain, sore throat, abdominal pain, nausea, vomiting, diarrhea, coughing, shortness of breath, wheezing. ? ?Past Medical History:  ?Diagnosis Date  ? Bladder mass   ? Dysuria   ? Endometriosis   ? Endometriosis, bladder   ? Hematuria   ? History of 2019 novel coronavirus disease (COVID-19) 08/24/2019  ? positive covid test result in epic, per pt mild symptoms that resolved  ? History of small bowel obstruction 02/2017  ? 2019 post op TAH  w/ surgical bowel resection  ? Lower urinary tract symptoms (LUTS)   ? Nephrolithiasis   ? per pt nonobstructive  ? ? ?Patient Active Problem List  ? Diagnosis Date Noted  ? Pelvic pressure in female 12/22/2015  ? Episodic tension-type headache, not intractable 08/16/2015  ? Occipital neuralgia of left side 04/20/2014  ? Headache(784.0) 02/25/2013  ? ? ?Past Surgical History:  ?Procedure Laterality Date  ? CESAREAN SECTION  1999;  07/ 2009  ? CYSTOSCOPY N/A 03/21/2020  ? Procedure: CYSTOSCOPY with biopsy;  Surgeon: Marguerita Beards, MD;  Location: [facility];  Service: Gynecology;  Laterality: N/A;  Total procedure time 1 hour  ? CYSTOSCOPY WITH URETHRAL DILATATION Right 05/2018  ? W/  TURBT   (right ureteral stricture)  ? DIAGNOSTIC LAPAROSCOPY  12/2016  ? DILATION AND CURETTAGE OF UTERUS  yrs ago  ? HEMICOLECTOMY  02/2017  ? fews days post op TAH for  bowel obstruction  ? TOTAL ABDOMINAL HYSTERECTOMY  02/2017  ? ? ?OB History   ? ? Gravida  ?3  ? Para  ?2  ? Term  ?2  ? Preterm  ?   ? AB  ?1  ? Living  ?2  ?  ? ? SAB  ?   ? IAB  ?1  ? Ectopic  ?   ? Multiple  ?   ? Live Births  ?2  ?   ?  ?  ? ? ? ?Home Medications   ? ?Prior to Admission medications   ?Medication Sig Start Date End Date Taking? Authorizing Provider  ?norethindrone-ethinyl estradiol (LOESTRIN 1/20, 21,) 1-20 MG-MCG tablet Take 1 tablet by mouth daily. Do NOT take inactive pills. 11/17/20  Yes Brock Bad, MD  ?oxyCODONE-acetaminophen (PERCOCET/ROXICET) 5-325 MG tablet Take 1 tablet by mouth daily as needed for moderate pain. Do Not Fill Before 03/15/2021 03/02/21  Yes Jones Bales, NP  ?Vibegron (GEMTESA) 75 MG TABS Take 1 tablet by mouth daily. 09/22/20  Yes Marguerita Beards, MD  ?ibuprofen (ADVIL) 800 MG tablet Take 1 tablet (800 mg total) by mouth every 8 (eight) hours as needed. 10/05/20   Brock Bad, MD  ?Prenatal Vit w/Fe-Methylfol-FA (PNV-SELECT) 27-0.6-0.4 MG TABS Take 1 tablet by mouth daily before breakfast. 09/12/20   Brock Bad, MD  ? ? ?Family History ?Family History  ?  Problem Relation Age of Onset  ? Healthy Mother   ? Ataxia Neg Hx   ? Chorea Neg Hx   ? Dementia Neg Hx   ? Mental retardation Neg Hx   ? Migraines Neg Hx   ? Multiple sclerosis Neg Hx   ? Neurofibromatosis Neg Hx   ? Neuropathy Neg Hx   ? Parkinsonism Neg Hx   ? Seizures Neg Hx   ? Stroke Neg Hx   ? ? ?Social History ?Social History  ? ?Tobacco Use  ? Smoking status: Former  ?  Years: 2.00  ?  Types: Cigarettes  ?  Quit date: 03/17/2018  ?  Years since quitting: 3.0  ? Smokeless tobacco: Never  ?Vaping Use  ? Vaping Use: Never used  ?Substance Use Topics  ? Alcohol use: Not Currently  ?  Alcohol/week: 0.0 standard drinks  ?  Comment: rare  ? Drug use: Never  ? ? ? ?Allergies   ?Patient has no known allergies. ? ? ?Review of Systems ?Review of Systems  ?Constitutional: Negative.   ?HENT:  Positive  for congestion and rhinorrhea. Negative for dental problem, drooling, ear discharge, ear pain, facial swelling, hearing loss, mouth sores, nosebleeds, postnasal drip, sinus pressure, sinus pain, sneezing, sore throat, tinnitus, trouble swallowing and voice change.   ?Respiratory: Negative.    ?Cardiovascular: Negative.   ?Skin: Negative.   ?Neurological:  Positive for headaches. Negative for dizziness, tremors, seizures, syncope, facial asymmetry, speech difficulty, weakness, light-headedness and numbness.  ? ? ?Physical Exam ?Triage Vital Signs ?ED Triage Vitals  ?Enc Vitals Group  ?   BP 04/01/21 1033 127/84  ?   Pulse Rate 04/01/21 1033 (!) 107  ?   Resp 04/01/21 1033 12  ?   Temp 04/01/21 1033 98.3 ?F (36.8 ?C)  ?   Temp Source 04/01/21 1033 Oral  ?   SpO2 04/01/21 1033 98 %  ?   Weight --   ?   Height --   ?   Head Circumference --   ?   Peak Flow --   ?   Pain Score 04/01/21 1031 10  ?   Pain Loc --   ?   Pain Edu? --   ?   Excl. in GC? --   ? ?No data found. ? ?Updated Vital Signs ?BP 127/84 (BP Location: Right Arm)   Pulse (!) 107   Temp 98.3 ?F (36.8 ?C) (Oral)   Resp 12   LMP 08/11/2016 (Exact Date)   SpO2 98%  ? ?Visual Acuity ?Right Eye Distance:   ?Left Eye Distance:   ?Bilateral Distance:   ? ?Right Eye Near:   ?Left Eye Near:    ?Bilateral Near:    ? ?Physical Exam ?Constitutional:   ?   Appearance: Normal appearance.  ?HENT:  ?   Head: Normocephalic.  ?   Right Ear: Tympanic membrane, ear canal and external ear normal.  ?   Left Ear: Tympanic membrane, ear canal and external ear normal.  ?   Nose: Congestion and rhinorrhea present.  ?   Mouth/Throat:  ?   Mouth: Mucous membranes are moist.  ?   Pharynx: Oropharynx is clear.  ?Eyes:  ?   Extraocular Movements: Extraocular movements intact.  ?Cardiovascular:  ?   Rate and Rhythm: Normal rate and regular rhythm.  ?   Pulses: Normal pulses.  ?   Heart sounds: Normal heart sounds.  ?Pulmonary:  ?   Effort: Pulmonary effort is normal.  ?  Breath  sounds: Normal breath sounds.  ?Musculoskeletal:  ?   Cervical back: Normal range of motion and neck supple.  ?Skin: ?   General: Skin is warm and dry.  ?Neurological:  ?   Mental Status: She is alert and oriented to person, place, and time. Mental status is at baseline.  ?Psychiatric:     ?   Mood and Affect: Mood normal.     ?   Behavior: Behavior normal.  ? ? ? ?UC Treatments / Results  ?Labs ?(all labs ordered are listed, but only abnormal results are displayed) ?Labs Reviewed - No data to display ? ?EKG ? ? ?Radiology ?No results found. ? ?Procedures ?Procedures (including critical care time) ? ?Medications Ordered in UC ?Medications - No data to display ? ?Initial Impression / Assessment and Plan / UC Course  ?I have reviewed the triage vital signs and the nursing notes. ? ?Pertinent labs & imaging results that were available during my care of the patient were reviewed by me and considered in my medical decision making (see chart for details). ? ?Viral URI ? ?Vital signs are stable, well ill-appearing patient is in no signs of distress, COVID test is pending, etiology of symptoms is most likely viral, discussed with patient, prescribed Mucinex and Flonase for management of congestion, may use over-the-counter medications in addition for further support, urgent care follow-up as needed, work note given ?Final Clinical Impressions(s) / UC Diagnoses  ? ?Final diagnoses:  ?None  ? ?Discharge Instructions   ?None ?  ? ?ED Prescriptions   ?None ?  ? ?PDMP not reviewed this encounter. ?  ?Valinda Hoar, NP ?04/01/21 1122 ? ?

## 2021-04-02 LAB — SARS CORONAVIRUS 2 (TAT 6-24 HRS): SARS Coronavirus 2: NEGATIVE

## 2021-04-04 ENCOUNTER — Other Ambulatory Visit: Payer: Self-pay | Admitting: Physical Medicine and Rehabilitation

## 2021-04-04 ENCOUNTER — Telehealth: Payer: Self-pay

## 2021-04-04 MED ORDER — OXYCODONE-ACETAMINOPHEN 10-325 MG PO TABS: 1.0000 | ORAL_TABLET | Freq: Every day | ORAL | 0 refills | Status: DC | PRN

## 2021-04-04 NOTE — Telephone Encounter (Signed)
Patient called stating she has an increase in pain and requesting increasing Percocet to 10/325 mg and  she needs a refill. She states she is going to have the surgery. ?[facility] ?

## 2021-04-05 NOTE — Telephone Encounter (Signed)
Pt.notified

## 2021-04-11 ENCOUNTER — Encounter: Payer: BLUE CROSS/BLUE SHIELD | Attending: Physical Medicine and Rehabilitation | Admitting: Registered Nurse

## 2021-04-11 ENCOUNTER — Encounter: Payer: Self-pay | Admitting: Registered Nurse

## 2021-04-11 ENCOUNTER — Other Ambulatory Visit: Payer: Self-pay

## 2021-04-11 VITALS — BP 119/81 | HR 90 | Temp 99.1°F | Ht 60.0 in | Wt 124.0 lb

## 2021-04-11 DIAGNOSIS — Z79891 Long term (current) use of opiate analgesic: Secondary | ICD-10-CM | POA: Diagnosis present

## 2021-04-11 DIAGNOSIS — R1013 Epigastric pain: Secondary | ICD-10-CM

## 2021-04-11 DIAGNOSIS — Z5181 Encounter for therapeutic drug level monitoring: Secondary | ICD-10-CM | POA: Diagnosis present

## 2021-04-11 DIAGNOSIS — G8929 Other chronic pain: Secondary | ICD-10-CM | POA: Diagnosis present

## 2021-04-11 DIAGNOSIS — R102 Pelvic and perineal pain: Secondary | ICD-10-CM | POA: Diagnosis present

## 2021-04-11 DIAGNOSIS — G894 Chronic pain syndrome: Secondary | ICD-10-CM | POA: Insufficient documentation

## 2021-04-11 NOTE — Progress Notes (Signed)
? ?Subjective:  ? ? Patient ID: Christine Brooks, female    DOB: Nov 12, 1980, 41 y.o.   MRN: [id number] ? ?HPI: Christine Brooks is a 41 y.o. female who returns for follow up appointment for chronic pain and medication refill. She states her pain is located in her abdomen and pelvic pain. She rates her pain 0. Her current exercise regime is walking.  ? ?Christine Brooks reports she is scheduled for surgery soon.  ? ?Christine Brooks  Morphine equivalent is 15.00  MME.   Last UDS was Performed on 02/02/2021, it was consistent.  ?  ? ? ?Pain Inventory ?Average Pain 9 ?Pain Right Now 0 ?My pain is constant, sharp, burning, and stabbing ? ?In the last 24 hours, has pain interfered with the following? ?General activity 10 ?Relation with others 10 ?Enjoyment of life 10 ?What TIME of day is your pain at its worst? varies ?Sleep (in general)  unsure it just happens ? ?Pain is worse with: unsure ?Pain improves with: medication ?Relief from Meds: 10 ? ?Family History  ?Problem Relation Age of Onset  ? Healthy Mother   ? Ataxia Neg Hx   ? Chorea Neg Hx   ? Dementia Neg Hx   ? Mental retardation Neg Hx   ? Migraines Neg Hx   ? Multiple sclerosis Neg Hx   ? Neurofibromatosis Neg Hx   ? Neuropathy Neg Hx   ? Parkinsonism Neg Hx   ? Seizures Neg Hx   ? Stroke Neg Hx   ? ?Social History  ? ?Socioeconomic History  ? Marital status: Single  ?  Spouse name: Not on file  ? Number of children: 2  ? Years of education: Not on file  ? Highest education level: Some college, no degree  ?Occupational History  ? Not on file  ?Tobacco Use  ? Smoking status: Former  ?  Years: 2.00  ?  Types: Cigarettes  ?  Quit date: 03/17/2018  ?  Years since quitting: 3.0  ? Smokeless tobacco: Never  ?Vaping Use  ? Vaping Use: Never used  ?Substance and Sexual Activity  ? Alcohol use: Not Currently  ?  Alcohol/week: 0.0 standard drinks  ?  Comment: rare  ? Drug use: Never  ? Sexual activity: Not Currently  ?  Partners: Male  ?Other Topics Concern  ? Not on file  ?Social History  Narrative  ? Not on file  ? ?Social Determinants of Health  ? ?Financial Resource Strain: Not on file  ?Food Insecurity: Not on file  ?Transportation Needs: No Transportation Needs  ? Lack of Transportation (Medical): No  ? Lack of Transportation (Non-Medical): No  ?Physical Activity: Not on file  ?Stress: Not on file  ?Social Connections: Not on file  ? ?Past Surgical History:  ?Procedure Laterality Date  ? CESAREAN SECTION  1999;  07/ 2009  ? CYSTOSCOPY N/A 03/21/2020  ? Procedure: CYSTOSCOPY with biopsy;  Surgeon: Marguerita Beards, MD;  Location: [facility];  Service: Gynecology;  Laterality: N/A;  Total procedure time 1 hour  ? CYSTOSCOPY WITH URETHRAL DILATATION Right 05/2018  ? W/  TURBT   (right ureteral stricture)  ? DIAGNOSTIC LAPAROSCOPY  12/2016  ? DILATION AND CURETTAGE OF UTERUS  yrs ago  ? HEMICOLECTOMY  02/2017  ? fews days post op TAH for bowel obstruction  ? TOTAL ABDOMINAL HYSTERECTOMY  02/2017  ? ?Past Surgical History:  ?Procedure Laterality Date  ? CESAREAN SECTION  1999;  07/ 2009  ?  CYSTOSCOPY N/A 03/21/2020  ? Procedure: CYSTOSCOPY with biopsy;  Surgeon: Marguerita Beards, MD;  Location: [facility];  Service: Gynecology;  Laterality: N/A;  Total procedure time 1 hour  ? CYSTOSCOPY WITH URETHRAL DILATATION Right 05/2018  ? W/  TURBT   (right ureteral stricture)  ? DIAGNOSTIC LAPAROSCOPY  12/2016  ? DILATION AND CURETTAGE OF UTERUS  yrs ago  ? HEMICOLECTOMY  02/2017  ? fews days post op TAH for bowel obstruction  ? TOTAL ABDOMINAL HYSTERECTOMY  02/2017  ? ?Past Medical History:  ?Diagnosis Date  ? Bladder mass   ? Dysuria   ? Endometriosis   ? Endometriosis, bladder   ? Hematuria   ? History of 2019 novel coronavirus disease (COVID-19) 08/24/2019  ? positive covid test result in epic, per pt mild symptoms that resolved  ? History of small bowel obstruction 02/2017  ? 2019 post op TAH  w/ surgical bowel resection  ? Lower urinary tract symptoms (LUTS)   ?  Nephrolithiasis   ? per pt nonobstructive  ? ?LMP 08/11/2016 (Exact Date)  ? ?Opioid Risk Score:   ?Fall Risk Score:  `1 ? ?Depression screen PHQ 2/9 ? ? ?  02/02/2021  ?  9:44 AM  ?Depression screen PHQ 2/9  ?Decreased Interest 3  ?Down, Depressed, Hopeless 0  ?PHQ - 2 Score 3  ?Altered sleeping 3  ?Tired, decreased energy 2  ?Change in appetite 0  ?Feeling bad or failure about yourself  0  ?Trouble concentrating 2  ?Moving slowly or fidgety/restless 0  ?Suicidal thoughts 0  ?PHQ-9 Score 10  ?Difficult doing work/chores Very difficult  ? ? ?Review of Systems  ?Genitourinary:  Positive for pelvic pain and vaginal pain.  ?All other systems reviewed and are negative. ? ?   ?Objective:  ? Physical Exam ?Vitals and nursing note reviewed.  ?Constitutional:   ?   Appearance: Normal appearance.  ?Cardiovascular:  ?   Rate and Rhythm: Normal rate and regular rhythm.  ?   Pulses: Normal pulses.  ?   Heart sounds: Normal heart sounds.  ?Pulmonary:  ?   Effort: Pulmonary effort is normal.  ?   Breath sounds: Normal breath sounds.  ?Musculoskeletal:  ?   Cervical back: Normal range of motion and neck supple.  ?   Comments: Normal Muscle Bulk and Muscle Testing Reveals: ? Upper Extremities: Full ROM and Muscle Strength 5/5 ?Lower Extremities: Full ROM and Muscle Strength 5/5 ?Arises from Table with Ease ?Narrow Based  Gait  ?   ?Skin: ?   General: Skin is warm and dry.  ?Neurological:  ?   Mental Status: She is alert and oriented to person, place, and time.  ?Psychiatric:     ?   Mood and Affect: Mood normal.     ?   Behavior: Behavior normal.  ? ? ? ? ?   ?Assessment & Plan:  ?Chronic abdominal pain secondary to endometriosis/ Pelvic Pain: Continue current medication regimen. Refilled : Oxycodone 5mg /325 mg one tablet daily as needed for pain. #30. We will continue the opioid monitoring program, this consists of regular clinic visits, examinations, urine drug screen, pill counts as well as use of Controlled  Substance Reporting system. A 12 month History has been reviewed on the [place] Controlled Substance Reporting System on 04/11/2021 ?Chronic Pain Syndrome: Continue current medication . Continue HEP as Tolerated . Continue to Monitor. 04/11/2021 ?  ?F/U in 1 month  ?  ? ?

## 2021-04-26 ENCOUNTER — Telehealth: Payer: Self-pay | Admitting: *Deleted

## 2021-04-26 NOTE — Telephone Encounter (Signed)
Ms Golob called asking for Christine Brooks to call her please. ?

## 2021-04-27 ENCOUNTER — Encounter: Payer: Self-pay | Admitting: Physical Medicine and Rehabilitation

## 2021-05-19 ENCOUNTER — Telehealth: Payer: Self-pay | Admitting: Registered Nurse

## 2021-05-19 MED ORDER — OXYCODONE-ACETAMINOPHEN 10-325 MG PO TABS: 1.0000 | ORAL_TABLET | Freq: Every day | ORAL | 0 refills | Status: DC | PRN

## 2021-05-19 NOTE — Telephone Encounter (Signed)
PMP was Reviewed.  ?Oxycodone e-scribed today. ?Ms. Amerie is aware via My-Chart message.  ?

## 2021-06-01 ENCOUNTER — Ambulatory Visit: Payer: BLUE CROSS/BLUE SHIELD | Admitting: Obstetrics

## 2021-06-05 ENCOUNTER — Ambulatory Visit (INDEPENDENT_AMBULATORY_CARE_PROVIDER_SITE_OTHER): Payer: BLUE CROSS/BLUE SHIELD

## 2021-06-05 ENCOUNTER — Other Ambulatory Visit (HOSPITAL_COMMUNITY)
Admission: RE | Admit: 2021-06-05 | Discharge: 2021-06-05 | Disposition: A | Discharge status: Home / Self Care | Payer: BLUE CROSS/BLUE SHIELD | Source: Ambulatory Visit | Attending: Obstetrics | Admitting: Obstetrics

## 2021-06-05 VITALS — BP 116/81 | HR 90 | Wt 123.0 lb

## 2021-06-05 DIAGNOSIS — N898 Other specified noninflammatory disorders of vagina: Secondary | ICD-10-CM

## 2021-06-05 NOTE — Progress Notes (Signed)
Patient was assessed and managed by nursing staff during this encounter. I have reviewed the chart and agree with the documentation and plan. I have also made any necessary editorial changes.  Catalina Antigua, MD 06/05/2021 11:00 AM

## 2021-06-05 NOTE — Progress Notes (Signed)
SUBJECTIVE:  41 y.o. female complains of thick white vaginal discharge for 1 week. Denies abnormal vaginal bleeding or significant pelvic pain or fever. No UTI symptoms. Denies history of known exposure to STD.  Patient's last menstrual period was 08/11/2016 (exact date). Hysterectomy   OBJECTIVE:  She appears well, afebrile. Urine dipstick: not done.  ASSESSMENT:  Vaginal Discharge  Vaginal Odor  PLAN:  GC, chlamydia, trichomonas, BVAG, CVAG probe sent to lab. Treatment: To be determined once lab results are reviewed by the provider ROV prn if symptoms persist or worsen.

## 2021-06-06 ENCOUNTER — Encounter: Payer: Self-pay | Admitting: Registered Nurse

## 2021-06-06 ENCOUNTER — Encounter: Payer: BLUE CROSS/BLUE SHIELD | Attending: Physical Medicine and Rehabilitation | Admitting: Registered Nurse

## 2021-06-06 ENCOUNTER — Encounter: Payer: Self-pay | Admitting: Physical Medicine and Rehabilitation

## 2021-06-06 VITALS — BP 128/78 | HR 93 | Ht 60.0 in | Wt 126.3 lb

## 2021-06-06 DIAGNOSIS — Z5181 Encounter for therapeutic drug level monitoring: Secondary | ICD-10-CM | POA: Diagnosis present

## 2021-06-06 DIAGNOSIS — Z79891 Long term (current) use of opiate analgesic: Secondary | ICD-10-CM | POA: Insufficient documentation

## 2021-06-06 DIAGNOSIS — G8929 Other chronic pain: Secondary | ICD-10-CM | POA: Insufficient documentation

## 2021-06-06 DIAGNOSIS — R102 Pelvic and perineal pain: Secondary | ICD-10-CM | POA: Diagnosis present

## 2021-06-06 DIAGNOSIS — G894 Chronic pain syndrome: Secondary | ICD-10-CM | POA: Insufficient documentation

## 2021-06-06 DIAGNOSIS — R1013 Epigastric pain: Secondary | ICD-10-CM | POA: Diagnosis present

## 2021-06-06 LAB — CERVICOVAGINAL ANCILLARY ONLY
Bacterial Vaginitis (gardnerella): NEGATIVE
Candida Glabrata: NEGATIVE
Candida Vaginitis: NEGATIVE
Chlamydia: NEGATIVE
Comment: NEGATIVE
Comment: NEGATIVE
Comment: NEGATIVE
Comment: NEGATIVE
Comment: NEGATIVE
Comment: NORMAL
Neisseria Gonorrhea: NEGATIVE
Trichomonas: NEGATIVE

## 2021-06-06 MED ORDER — OXYCODONE-ACETAMINOPHEN 10-325 MG PO TABS
1.0000 | ORAL_TABLET | Freq: Every day | ORAL | 0 refills | Status: DC | PRN
Start: 2021-06-06 — End: 2021-06-26

## 2021-06-06 NOTE — Progress Notes (Signed)
Subjective:    Patient ID: Christine Brooks, female    DOB: 07-12-1980, 41 y.o.   MRN: [id number]  HPI: Christine Brooks is a 41 y.o. female who returns for follow up appointment for chronic pain and medication refill. She states her  pain is located in her pelvic area, she reports increase intensity of pain and she is awaiting surgery on 09/08/2021 . She rates her pain 7. Her current exercise regime is walking and performing stretching exercises.  [name] Morphine equivalent is 15.00 MME.   Last UDS was Performed 02/02/2021, it was consistent.     Pain Inventory Average Pain 9 Pain Right Now 7 My pain is sharp, burning, dull, and stabbing  In the last 24 hours, has pain interfered with the following? General activity 8 Relation with others 8 Enjoyment of life 8 What TIME of day is your pain at its worst? varies Sleep (in general) Fair  Pain is worse with: some activites Pain improves with: rest and medication Relief from Meds: 10  Family History  Problem Relation Age of Onset   Healthy Mother    Ataxia Neg Hx    Chorea Neg Hx    Dementia Neg Hx    Mental retardation Neg Hx    Migraines Neg Hx    Multiple sclerosis Neg Hx    Neurofibromatosis Neg Hx    Neuropathy Neg Hx    Parkinsonism Neg Hx    Seizures Neg Hx    Stroke Neg Hx    Social History   Socioeconomic History   Marital status: Single    Spouse name: Not on file   Number of children: 2   Years of education: Not on file   Highest education level: Some college, no degree  Occupational History   Not on file  Tobacco Use   Smoking status: Former    Years: 2.00    Types: Cigarettes    Quit date: 03/17/2018    Years since quitting: 3.2    Passive exposure: Past   Smokeless tobacco: Never  Vaping Use   Vaping Use: Never used  Substance and Sexual Activity   Alcohol use: Not Currently    Alcohol/week: 0.0 standard drinks    Comment: rare   Drug use: Never   Sexual activity: Not Currently    Partners:  Male  Other Topics Concern   Not on file  Social History Narrative   Not on file   Social Determinants of Health   Financial Resource Strain: Not on file  Food Insecurity: Not on file  Transportation Needs: Not on file  Physical Activity: Not on file  Stress: Not on file  Social Connections: Not on file   Past Surgical History:  Procedure Laterality Date   CESAREAN SECTION  1999;  07/ 2009   CYSTOSCOPY N/A 03/21/2020   Procedure: CYSTOSCOPY with biopsy;  Surgeon: Marguerita Beards, MD;  Location: [facility];  Service: Gynecology;  Laterality: N/A;  Total procedure time 1 hour   CYSTOSCOPY WITH URETHRAL DILATATION Right 05/2018   W/  TURBT   (right ureteral stricture)   DIAGNOSTIC LAPAROSCOPY  12/2016   DILATION AND CURETTAGE OF UTERUS  yrs ago   HEMICOLECTOMY  02/2017   fews days post op TAH for bowel obstruction   TOTAL ABDOMINAL HYSTERECTOMY  02/2017   Past Surgical History:  Procedure Laterality Date   CESAREAN SECTION  1999;  07/ 2009   CYSTOSCOPY N/A 03/21/2020   Procedure: CYSTOSCOPY  with biopsy;  Surgeon: Marguerita Beards, MD;  Location: [facility];  Service: Gynecology;  Laterality: N/A;  Total procedure time 1 hour   CYSTOSCOPY WITH URETHRAL DILATATION Right 05/2018   W/  TURBT   (right ureteral stricture)   DIAGNOSTIC LAPAROSCOPY  12/2016   DILATION AND CURETTAGE OF UTERUS  yrs ago   HEMICOLECTOMY  02/2017   fews days post op TAH for bowel obstruction   TOTAL ABDOMINAL HYSTERECTOMY  02/2017   Past Medical History:  Diagnosis Date   Bladder mass    Dysuria    Endometriosis    Endometriosis, bladder    Hematuria    History of 2019 novel coronavirus disease (COVID-19) 08/24/2019   positive covid test result in epic, per pt mild symptoms that resolved   History of small bowel obstruction 02/2017   2019 post op TAH  w/ surgical bowel resection   Lower urinary tract symptoms (LUTS)    Nephrolithiasis    per pt  nonobstructive   BP 128/78   Pulse 93   Ht 5' (1.524 m)   Wt 126 lb 4.8 oz (57.3 kg)   LMP 08/11/2016 (Exact Date)   SpO2 98%   BMI 24.67 kg/m   Opioid Risk Score:   Fall Risk Score:  `1  Depression screen [facility] 2/9     06/06/2021   11:11 AM 04/11/2021   11:09 AM 02/02/2021    9:44 AM  Depression screen PHQ 2/9  Decreased Interest 0 0 3  Down, Depressed, Hopeless 1 0 0  PHQ - 2 Score 1 0 3  Altered sleeping   3  Tired, decreased energy   2  Change in appetite   0  Feeling bad or failure about yourself    0  Trouble concentrating   2  Moving slowly or fidgety/restless   0  Suicidal thoughts   0  PHQ-9 Score   10  Difficult doing work/chores   Very difficult     Review of Systems  Constitutional: Negative.   HENT: Negative.    Eyes: Negative.   Respiratory: Negative.    Cardiovascular: Negative.   Gastrointestinal:  Positive for abdominal pain.  Endocrine: Negative.   Genitourinary: Negative.   Musculoskeletal: Negative.   Skin: Negative.   Allergic/Immunologic: Negative.   Neurological: Negative.   Hematological: Negative.   Psychiatric/Behavioral: Negative.        Objective:   Physical Exam Vitals and nursing note reviewed.  Constitutional:      Appearance: Normal appearance.  Cardiovascular:     Rate and Rhythm: Normal rate and regular rhythm.     Pulses: Normal pulses.     Heart sounds: Normal heart sounds.  Pulmonary:     Effort: Pulmonary effort is normal.     Breath sounds: Normal breath sounds.  Musculoskeletal:     Cervical back: Normal range of motion and neck supple.     Comments: Normal Muscle Bulk and Muscle Testing Reveals:  Upper Extremities: Full ROM and Muscle Strength 5/5 Lower Extremities Full ROM and Muscle Strength 5/5  Arises from Table slowly Narrow Based Gait     Skin:    General: Skin is warm and dry.  Neurological:     Mental Status: She is alert and oriented to person, place, and time.  Psychiatric:        Mood and  Affect: Mood normal.        Behavior: Behavior normal.  Assessment & Plan:  Chronic abdominal pain secondary to endometriosis/ Pelvic Pain: Continue current medication regimen. Refilled : Oxycodone 5mg /325 mg one tablet daily as needed for pain. #30. We will continue the opioid monitoring program, this consists of regular clinic visits, examinations, urine drug screen, pill counts as well as use of [place] Controlled Substance Reporting system. A 12 month History has been reviewed on the [place] Cont[place] Substance Reporting System on 06/06/2021 Chronic Pain Syndrome: Continue current medication . Continue HEP as Tolerated . Continue to Monitor. 06/06/2021   F/U in 1 month

## 2021-06-13 ENCOUNTER — Ambulatory Visit: Payer: BLUE CROSS/BLUE SHIELD | Admitting: Physical Medicine and Rehabilitation

## 2021-06-15 ENCOUNTER — Encounter: Payer: Self-pay | Admitting: Obstetrics

## 2021-06-16 ENCOUNTER — Other Ambulatory Visit: Payer: Self-pay | Admitting: Obstetrics

## 2021-06-16 DIAGNOSIS — B3731 Acute candidiasis of vulva and vagina: Secondary | ICD-10-CM

## 2021-06-16 MED ORDER — FLUCONAZOLE 150 MG PO TABS: 150.0000 mg | ORAL_TABLET | Freq: Once | ORAL | 0 refills | Status: AC

## 2021-06-19 ENCOUNTER — Other Ambulatory Visit: Payer: Self-pay | Admitting: Emergency Medicine

## 2021-06-19 DIAGNOSIS — B3731 Acute candidiasis of vulva and vagina: Secondary | ICD-10-CM

## 2021-06-19 MED ORDER — FLUCONAZOLE 150 MG PO TABS: 150.0000 mg | ORAL_TABLET | Freq: Once | ORAL | 0 refills | Status: DC

## 2021-06-19 NOTE — Progress Notes (Signed)
Diflucan Rx sent to pharmacy. 

## 2021-06-26 ENCOUNTER — Telehealth: Payer: Self-pay

## 2021-06-26 MED ORDER — OXYCODONE-ACETAMINOPHEN 10-325 MG PO TABS: 1.0000 | ORAL_TABLET | Freq: Every day | ORAL | 0 refills | Status: DC | PRN

## 2021-06-26 NOTE — Telephone Encounter (Signed)
PMP was Reviewed.  Oxycodone e-scribed today.  

## 2021-06-26 NOTE — Addendum Note (Signed)
Addended by: Jones Bales on: 06/26/2021 01:50 PM   Modules accepted: Orders

## 2021-06-26 NOTE — Telephone Encounter (Signed)
Patient called stating that [facility] [facility] did not have Oxycodone/APAP in stock but [facility] did. Can you send to [facility]? Cancelled at [facility]

## 2021-07-05 ENCOUNTER — Encounter: Payer: Self-pay | Admitting: Registered Nurse

## 2021-07-05 ENCOUNTER — Encounter: Payer: BLUE CROSS/BLUE SHIELD | Attending: Physical Medicine and Rehabilitation | Admitting: Registered Nurse

## 2021-07-05 VITALS — BP 119/84 | HR 85 | Ht 60.0 in | Wt 131.6 lb

## 2021-07-05 DIAGNOSIS — G894 Chronic pain syndrome: Secondary | ICD-10-CM

## 2021-07-05 DIAGNOSIS — R102 Pelvic and perineal pain: Secondary | ICD-10-CM

## 2021-07-05 DIAGNOSIS — Z79891 Long term (current) use of opiate analgesic: Secondary | ICD-10-CM

## 2021-07-05 DIAGNOSIS — Z5181 Encounter for therapeutic drug level monitoring: Secondary | ICD-10-CM

## 2021-07-05 MED ORDER — OXYCODONE-ACETAMINOPHEN 10-325 MG PO TABS: 1.0000 | ORAL_TABLET | Freq: Every day | ORAL | 0 refills | Status: DC | PRN

## 2021-07-05 NOTE — Progress Notes (Signed)
Subjective:    Patient ID: Christine Brooks, female    DOB: 05-07-1980, 41 y.o.   MRN: [id number]  HPI: Christine Brooks is a 41 y.o. female who returns for follow up appointment for chronic pain and medication refill. She states her pain is located in her left lower groin. She rates her pain 0. Her current exercise regime is walking and performing stretching exercises.  Christine Brooks Morphine equivalent is 15.00 MME.   UDS ordered today.     Pain Inventory Average Pain 7 Pain Right Now 0 My pain is sharp and stabbing  In the last 24 hours, has pain interfered with the following? General activity 7 Relation with others 7 Enjoyment of life 7 What TIME of day is your pain at its worst? varies Sleep (in general) Fair  Pain is worse with: walking, bending, sitting, inactivity, standing, and some activites Pain improves with: rest and medication Relief from Meds: 10  Family History  Problem Relation Age of Onset   Healthy Mother    Ataxia Neg Hx    Chorea Neg Hx    Dementia Neg Hx    Mental retardation Neg Hx    Migraines Neg Hx    Multiple sclerosis Neg Hx    Neurofibromatosis Neg Hx    Neuropathy Neg Hx    Parkinsonism Neg Hx    Seizures Neg Hx    Stroke Neg Hx    Social History   Socioeconomic History   Marital status: Single    Spouse name: Not on file   Number of children: 2   Years of education: Not on file   Highest education level: Some college, no degree  Occupational History   Not on file  Tobacco Use   Smoking status: Former    Years: 2.00    Types: Cigarettes    Quit date: 03/17/2018    Years since quitting: 3.3    Passive exposure: Past   Smokeless tobacco: Never  Vaping Use   Vaping Use: Never used  Substance and Sexual Activity   Alcohol use: Not Currently    Alcohol/week: 0.0 standard drinks of alcohol    Comment: rare   Drug use: Never   Sexual activity: Not Currently    Partners: Male  Other Topics Concern   Not on file  Social History  Narrative   Not on file   Social Determinants of Health   Financial Resource Strain: Not on file  Food Insecurity: Not on file  Transportation Needs: No Transportation Needs (05/19/2020)   PRAPARE - Administrator, Civil Service (Medical): No    Lack of Transportation (Non-Medical): No  Physical Activity: Not on file  Stress: Not on file  Social Connections: Not on file   Past Surgical History:  Procedure Laterality Date   CESAREAN SECTION  1999;  07/ 2009   CYSTOSCOPY N/A 03/21/2020   Procedure: CYSTOSCOPY with biopsy;  Surgeon: Marguerita Beards, MD;  Location: [facility];  Service: Gynecology;  Laterality: N/A;  Total procedure time 1 hour   CYSTOSCOPY WITH URETHRAL DILATATION Right 05/2018   W/  TURBT   (right ureteral stricture)   DIAGNOSTIC LAPAROSCOPY  12/2016   DILATION AND CURETTAGE OF UTERUS  yrs ago   HEMICOLECTOMY  02/2017   fews days post op TAH for bowel obstruction   TOTAL ABDOMINAL HYSTERECTOMY  02/2017   Past Surgical History:  Procedure Laterality Date   CESAREAN SECTION  1999;  07/ 2009  CYSTOSCOPY N/A 03/21/2020   Procedure: CYSTOSCOPY with biopsy;  Surgeon: Marguerita Beards, MD;  Location: [facility];  Service: Gynecology;  Laterality: N/A;  Total procedure time 1 hour   CYSTOSCOPY WITH URETHRAL DILATATION Right 05/2018   W/  TURBT   (right ureteral stricture)   DIAGNOSTIC LAPAROSCOPY  12/2016   DILATION AND CURETTAGE OF UTERUS  yrs ago   HEMICOLECTOMY  02/2017   fews days post op TAH for bowel obstruction   TOTAL ABDOMINAL HYSTERECTOMY  02/2017   Past Medical History:  Diagnosis Date   Bladder mass    Dysuria    Endometriosis    Endometriosis, bladder    Hematuria    History of 2019 novel coronavirus disease (COVID-19) 08/24/2019   positive covid test result in epic, per pt mild symptoms that resolved   History of small bowel obstruction 02/2017   2019 post op TAH  w/ surgical bowel resection    Lower urinary tract symptoms (LUTS)    Nephrolithiasis    per pt nonobstructive   BP 119/84   Pulse 85   Ht 5' (1.524 m)   Wt 131 lb 9.6 oz (59.7 kg)   LMP 08/11/2016 (Exact Date)   SpO2 98%   BMI 25.70 kg/m   Opioid Risk Score:   Fall Risk Score:  `1  Depression screen [facility] 2/9     07/05/2021   11:21 AM 06/06/2021   11:11 AM 04/11/2021   11:09 AM 02/02/2021    9:44 AM  Depression screen PHQ 2/9  Decreased Interest 0 0 0 3  Down, Depressed, Hopeless 0 1 0 0  PHQ - 2 Score 0 1 0 3  Altered sleeping    3  Tired, decreased energy    2  Change in appetite    0  Feeling bad or failure about yourself     0  Trouble concentrating    2  Moving slowly or fidgety/restless    0  Suicidal thoughts    0  PHQ-9 Score    10  Difficult doing work/chores    Very difficult     Review of Systems  Constitutional: Negative.   HENT: Negative.    Eyes: Negative.   Respiratory: Negative.    Cardiovascular: Negative.   Gastrointestinal:  Positive for abdominal pain.  Endocrine: Negative.   Genitourinary: Negative.   Musculoskeletal: Negative.   Skin: Negative.   Allergic/Immunologic: Negative.   Neurological: Negative.   Hematological: Negative.   Psychiatric/Behavioral: Negative.        Objective:   Physical Exam Vitals and nursing note reviewed.  Constitutional:      Appearance: Normal appearance.  Cardiovascular:     Rate and Rhythm: Normal rate and regular rhythm.     Pulses: Normal pulses.     Heart sounds: Normal heart sounds.  Pulmonary:     Effort: Pulmonary effort is normal.     Breath sounds: Normal breath sounds.  Musculoskeletal:     Cervical back: Normal range of motion and neck supple.     Comments: Normal Muscle Bulk and Muscle Testing Reveals:  Upper Extremities:Full  ROM and Muscle Strength 5/5 Lower Extremities: Full ROM and  Muscle Strength 5/5 Arises from Chair with Ease Narrow Based  Gait     Skin:    General: Skin is warm and dry.  Neurological:      Mental Status: She is alert and oriented to person, place, and time.  Psychiatric:  Mood and Affect: Mood normal.        Behavior: Behavior normal.         Assessment & Plan:  Chronic abdominal pain secondary to endometriosis/ Pelvic Pain: Continue current medication regimen. Refilled : Oxycodone 5mg /325 mg one tablet daily as needed for pain. #30. We will continue the opioid monitoring program, this consists of regular clinic visits, examinations, urine drug screen, pill counts as well as use of [place] Controlled Substance Reporting system. A 12 month History has been reviewed on the [place] Controlled Substance Reporting System on 07/06/2021 Chronic Pain Syndrome: Continue current medication . Continue HEP as Tolerated . Continue to Monitor. 07/06/2021   F/U in 1 month

## 2021-07-11 ENCOUNTER — Telehealth: Payer: Self-pay | Admitting: *Deleted

## 2021-07-11 LAB — DRUG TOX MONITOR 1 W/CONF, ORAL FLD
Amphetamines: NEGATIVE ng/mL (ref <10)
Barbiturates: NEGATIVE ng/mL (ref <10)
Benzodiazepines: NEGATIVE ng/mL (ref <0.50)
Buprenorphine: NEGATIVE ng/mL (ref <0.10)
Cocaine: NEGATIVE ng/mL (ref <5.0)
Codeine: NEGATIVE ng/mL (ref <2.5)
Dihydrocodeine: NEGATIVE ng/mL (ref <2.5)
Fentanyl: NEGATIVE ng/mL (ref <0.10)
Heroin Metabolite: NEGATIVE ng/mL (ref <1.0)
Hydrocodone: NEGATIVE ng/mL (ref <2.5)
Hydromorphone: NEGATIVE ng/mL (ref <2.5)
MARIJUANA: NEGATIVE ng/mL (ref <2.5)
MDMA: NEGATIVE ng/mL (ref <10)
Meprobamate: NEGATIVE ng/mL (ref <2.5)
Methadone: NEGATIVE ng/mL (ref <5.0)
Morphine: NEGATIVE ng/mL (ref <2.5)
Nicotine Metabolite: NEGATIVE ng/mL (ref <5.0)
Norhydrocodone: NEGATIVE ng/mL (ref <2.5)
Noroxycodone: 2.9 ng/mL — ABNORMAL HIGH (ref <2.5)
Opiates: POSITIVE ng/mL — AB (ref <2.5)
Oxycodone: 4.6 ng/mL — ABNORMAL HIGH (ref <2.5)
Oxymorphone: NEGATIVE ng/mL (ref <2.5)
Phencyclidine: NEGATIVE ng/mL (ref <10)
Tapentadol: NEGATIVE ng/mL (ref <5.0)
Tramadol: NEGATIVE ng/mL (ref <5.0)
Zolpidem: NEGATIVE ng/mL (ref <5.0)

## 2021-07-11 LAB — DRUG TOX ALC METAB W/CON, ORAL FLD: Alcohol Metabolite: NEGATIVE ng/mL (ref <25)

## 2021-07-29 ENCOUNTER — Other Ambulatory Visit: Payer: Self-pay | Admitting: Obstetrics

## 2021-07-29 DIAGNOSIS — B3731 Acute candidiasis of vulva and vagina: Secondary | ICD-10-CM

## 2021-07-31 ENCOUNTER — Telehealth: Payer: Self-pay | Admitting: Registered Nurse

## 2021-07-31 NOTE — Telephone Encounter (Signed)
Letter printed.  Christine Brooks is aware of the above via My-Chart message

## 2021-08-15 IMAGING — US US PELVIS COMPLETE WITH TRANSVAGINAL
1 series · 15 of 25 positions shown · non-contrast
Comparison: September 09, 2019

CLINICAL DATA: 39-year-old female with history of endometriosis and
known bladder invasion by report.

EXAM:
TRANSABDOMINAL AND TRANSVAGINAL ULTRASOUND OF PELVIS
TECHNIQUE: Both transabdominal and transvaginal ultrasound examinations of the
pelvis were performed. Transabdominal technique was performed for
global imaging of the pelvis including uterus, ovaries, adnexal
regions, and pelvic cul-de-sac. It was necessary to proceed with
endovaginal exam following the transabdominal exam to visualize the
RIGHT and LEFT adnexa.

[Series 1: us pelvis complete with transvaginal · 15 of 54 slices shown]
[im 1/54]
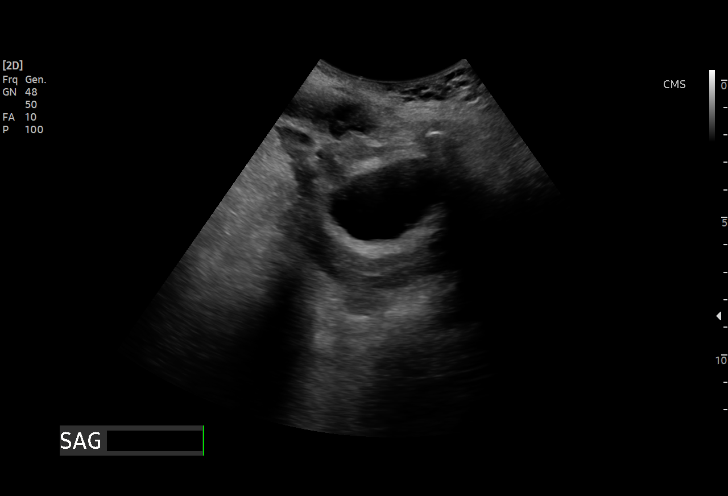
[im 5/54]
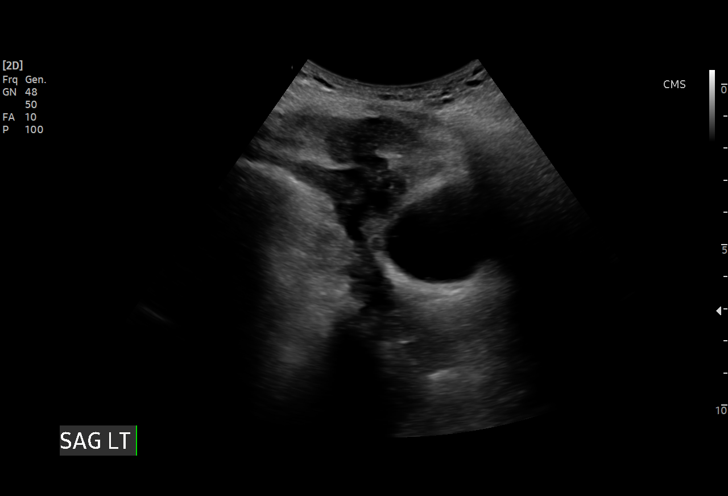
[im 9/54]
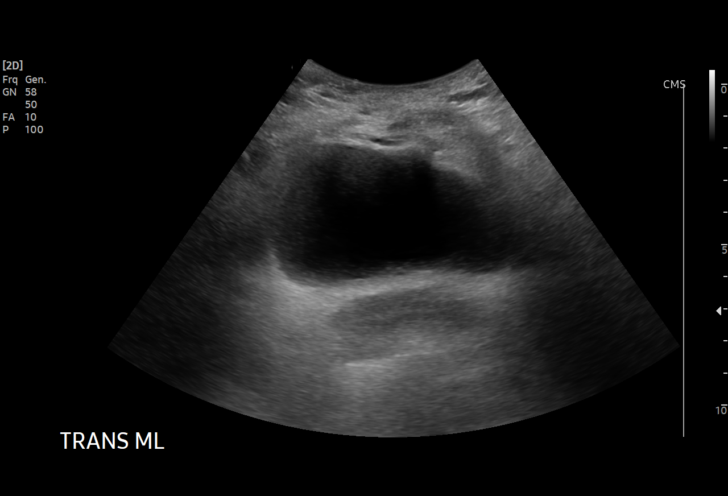
[im 12/54]
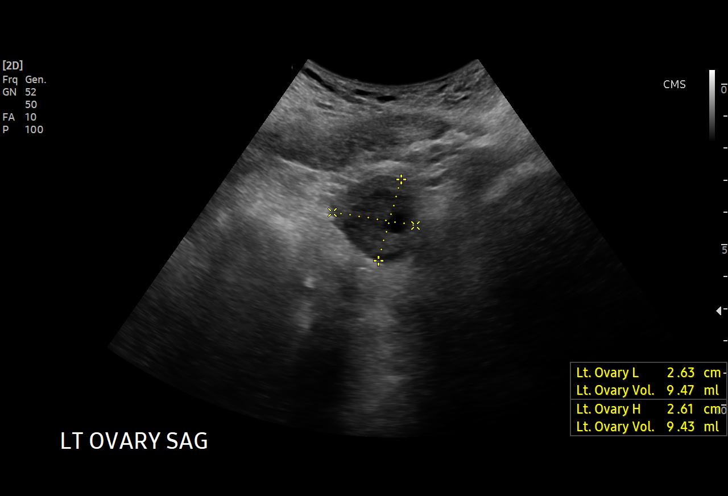
[im 16/54]
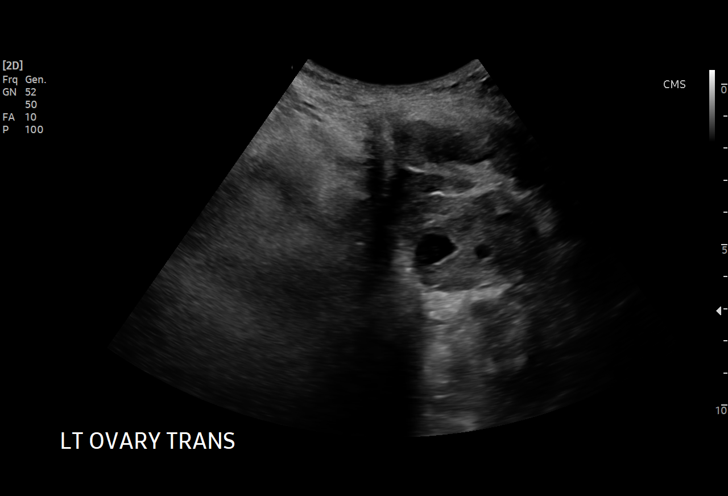
[im 20/54]
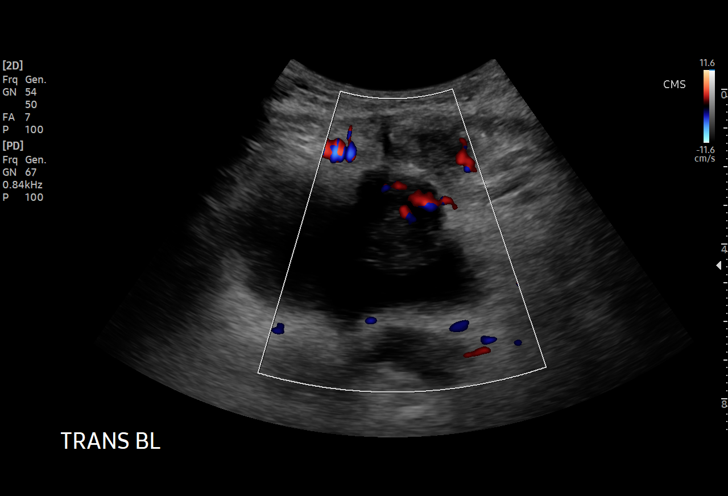
[im 23/54]
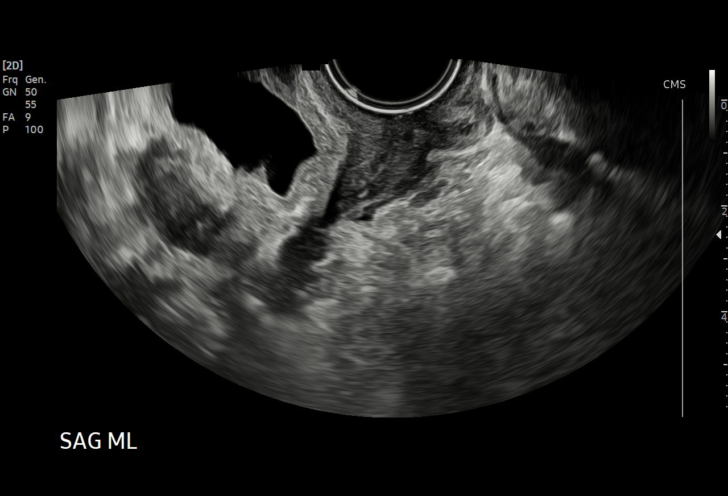
[im 27/54]
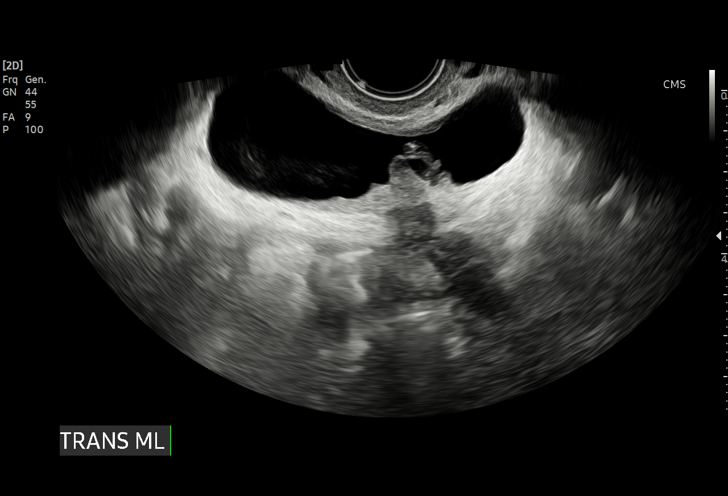
[im 31/54]
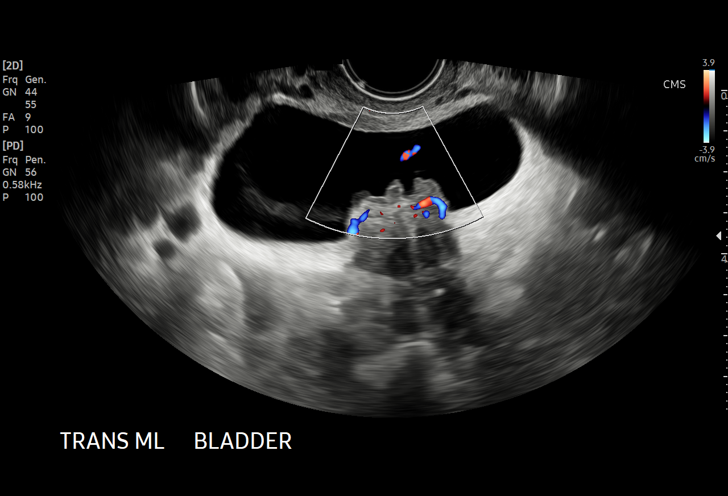
[im 34/54]
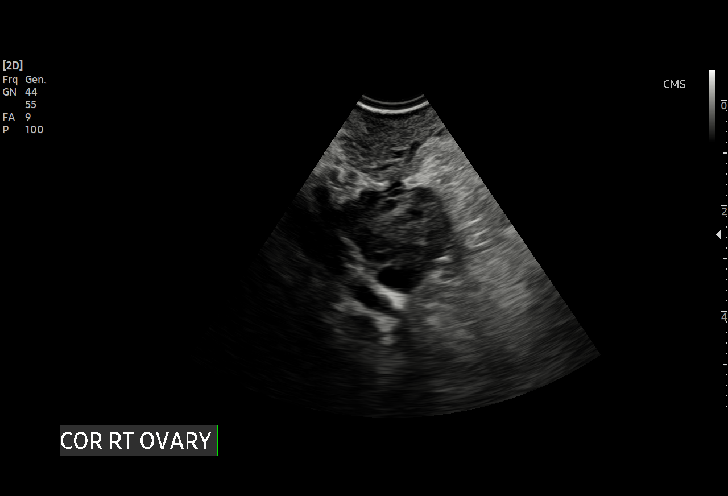
[im 38/54]
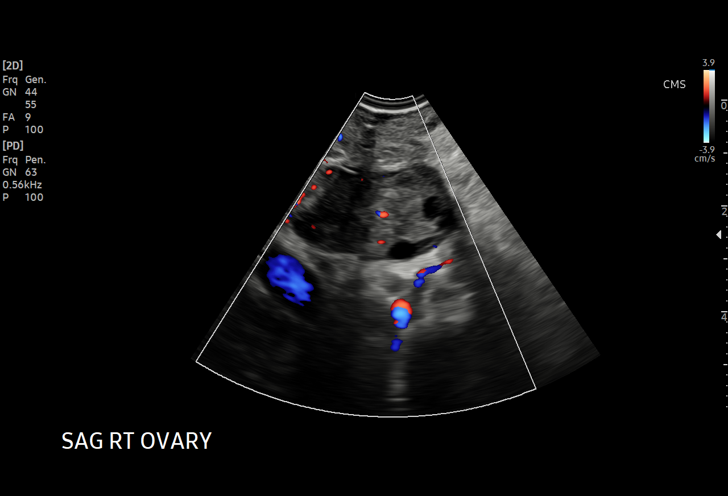
[im 42/54]
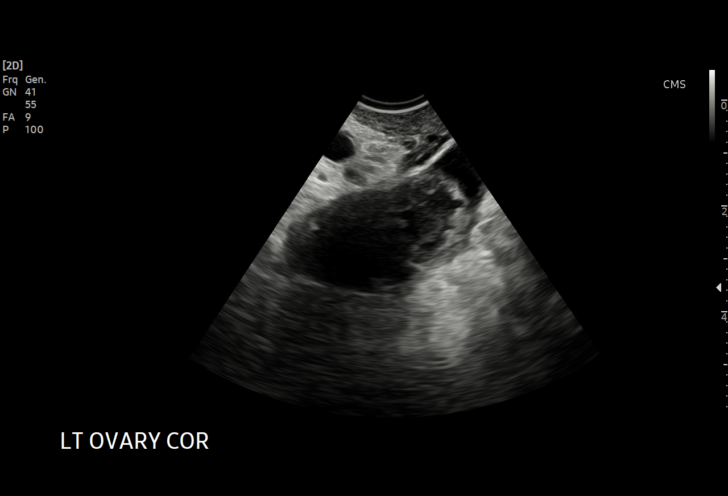
[im 45/54]
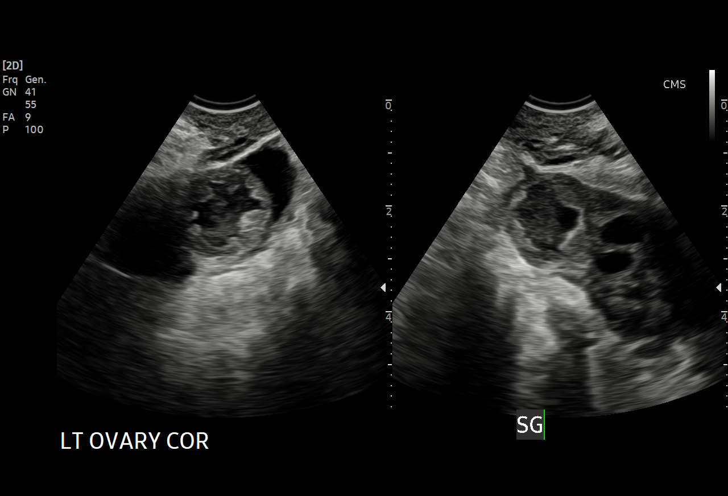
[im 49/54]
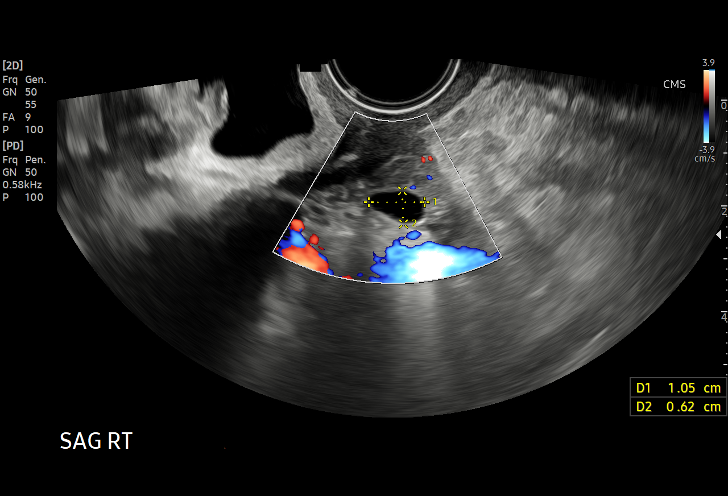
[im 54/54]
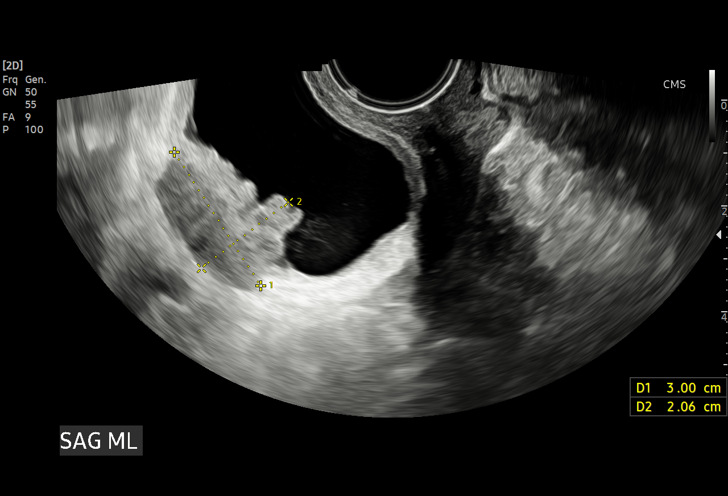

[15 of 25 positions shown; findings below may reference images not displayed]

FINDINGS: Uterus

Surgically absent.

Right ovary

Measurements: 3.2 x 1.9 x 2.0 cm = volume: 6.3 mL. Small cyst
arising from the RIGHT ovary 1.1 x 1.0 x 0.6 cm.

Left ovary

Measurements: 4.2 x 2.2 x 3.3 cm = volume: 15.8 mL. Small
hemorrhagic cyst diminished from previous exam [DATE] x 1.9 x 1.8 cm on
today's study, previously 3.3 x 2.9 x 3.0 cm.

Other findings

Small free fluid in the pelvis.

Partially collapsed urinary bladder displays a 3.0 x 2.1 x 2.9 cm
irregular mass with internal vascularity which appears to involve
the bladder wall in a transmural fashion. This previously measured
approximately 3.1 x 1.6 x 3.2 cm
IMPRESSION: Post hysterectomy with diminished size of hemorrhagic cyst of the
LEFT ovary versus is formation of corpus luteum in this location
since prior imaging.

Bladder mass with involvement of the posterior urinary bladder in a
transmural fashion. Potentially mildly enlarged since Friday August, 2019; by report the patient has bladder involvement from
endometriosis which could certainly have this appearance. By
ultrasound bladder neoplasm/urothelial carcinoma could have a
similar appearance. Cystoscopic correlation may be helpful if not
yet performed to establish diagnosis.

## 2021-08-22 ENCOUNTER — Encounter
Payer: BLUE CROSS/BLUE SHIELD | Attending: Physical Medicine and Rehabilitation | Admitting: Physical Medicine and Rehabilitation

## 2021-08-22 ENCOUNTER — Encounter: Payer: Self-pay | Admitting: Physical Medicine and Rehabilitation

## 2021-08-22 VITALS — BP 115/81 | HR 86 | Ht 60.0 in | Wt 125.8 lb

## 2021-08-22 DIAGNOSIS — G894 Chronic pain syndrome: Secondary | ICD-10-CM | POA: Insufficient documentation

## 2021-08-22 DIAGNOSIS — K5903 Drug induced constipation: Secondary | ICD-10-CM | POA: Insufficient documentation

## 2021-08-22 DIAGNOSIS — R102 Pelvic and perineal pain: Secondary | ICD-10-CM | POA: Diagnosis present

## 2021-08-22 MED ORDER — OXYCODONE-ACETAMINOPHEN 10-325 MG PO TABS: 1.0000 | ORAL_TABLET | Freq: Every day | ORAL | 0 refills | Status: DC | PRN

## 2021-08-22 NOTE — Patient Instructions (Signed)
Constipation:  -Provided list of following foods that help with constipation and highlighted a few: 1) prunes- contain high amounts of fiber.  2) apples- has a form of dietary fiber called pectin that accelerates stool movement and increases beneficial gut bacteria 3) pears- in addition to fiber, also high in fructose and sorbitol which have laxative effect 4) figs- contain an enzyme ficin which helps to speed colonic transit 5) kiwis- contain an enzyme actinidin that improves gut motility and reduces constipation 6) oranges- rich in pectin (like apples) 7) grapefruits- contain a flavanol naringenin which has a laxative effect 8) vegetables- rich in fiber and also great sources of folate, vitamin C, and K 9) artichoke- high in inulin, prebiotic great for the microbiome 10) chicory- increases stool frequency and softness (can be added to coffee) 11) rhubarb- laxative effect 12) sweet potato- high fiber 13) beans, peas, and lentils- contain both soluble and insoluble fiber 14) chia seeds- improves intestinal health and gut flora 15) flaxseeds- laxative effect 16) whole grain rye bread- high in fiber 17) oat bran- high in soluble and insoluble fiber 18) kefir- softens stools -recommended to try at least one of these foods every day.  -drink 6-8 glasses of water per day -walk regularly, especially after meals.      Foods for pain:  1) Ginger (especially studied for arthritis)- reduce leukotriene production to decrease inflammation 2) Blueberries- high in phytonutrients that decrease inflammation 3) Salmon- marine omega-3s reduce joint swelling and pain 4) Pumpkin seeds- reduce inflammation 5) dark chocolate- reduces inflammation 6) turmeric- reduces inflammation 7) tart cherries - reduce pain and stiffness 8) extra virgin olive oil - its compound olecanthal helps to block prostaglandins  9) chili peppers- can be eaten or applied topically via capsaicin 10) mint- helpful for headache,  muscle aches, joint pain, and itching 11) garlic- reduces inflammation  Link to further information on diet for chronic pain: [email or web address]

## 2021-08-22 NOTE — Progress Notes (Signed)
Subjective:    Patient ID: Christine Brooks, female    DOB: March 10, 1980, 41 y.o.   MRN: [id number]  HPI Christine Brooks is a 41 year old woman who presents for follow-up of chronic abdominal pain.  1) Chronic abdominal pain -her pain started 4 years ago -taking a leave from work due to her pain.  -she needs a note for her surgery -she is scheduled for surgery in September.  -she was diagnosed with endometriosis. -it took a long time for her to be diagnosed -she was taking birth control to stop the spread of the endometriosis and she is not sure whether it helped. -on her last cystoscopy she had no lesions.  -she was taking ibuprofen and oxycodone. She takes both together. She takes this combination about one per day. She tries not to take this.  -the pain feels sharp, like contractions.  -she has MRI scheduled -she has 2 children and cannot have any more due to her partial hysterectomy.  -has not been sexually active for years due to the pain. -it just came up on her all of a sudden, she had never even heard of endometriosis.  -her goal is pain control -she has a lot of scar tissue  2) Depression -she developed depression due to her pain and how it is effecting her quality of life. -she finds that now her mind is in a better place since she gave her life to [employer]  3) Constipation -she gets this from her pain medication and has to take a laxative  Pain Inventory Average Pain 9 Pain Right Now 0 My pain is intermittent, sharp, and stabbing  In the last 24 hours, has pain interfered with the following? General activity 0 Relation with others 0 Enjoyment of life 0 What TIME of day is your pain at its worst? varies Sleep (in general) Fair  Pain is worse with: unsure Pain improves with: rest and medication Relief from Meds: 10      Family History  Problem Relation Age of Onset   Healthy Mother    Ataxia Neg Hx    Chorea Neg Hx    Dementia Neg Hx    Mental retardation Neg  Hx    Migraines Neg Hx    Multiple sclerosis Neg Hx    Neurofibromatosis Neg Hx    Neuropathy Neg Hx    Parkinsonism Neg Hx    Seizures Neg Hx    Stroke Neg Hx    Social History   Socioeconomic History   Marital status: Single    Spouse name: Not on file   Number of children: 2   Years of education: Not on file   Highest education level: Some college, no degree  Occupational History   Not on file  Tobacco Use   Smoking status: Former    Years: 2.00    Types: Cigarettes    Quit date: 03/17/2018    Years since quitting: 3.4    Passive exposure: Past   Smokeless tobacco: Never  Vaping Use   Vaping Use: Never used  Substance and Sexual Activity   Alcohol use: Not Currently    Alcohol/week: 0.0 standard drinks of alcohol    Comment: rare   Drug use: Never   Sexual activity: Not Currently    Partners: Male  Other Topics Concern   Not on file  Social History Narrative   Not on file   Social Determinants of Health   Financial Resource Strain: Not on file  Food Insecurity: Not on file  Transportation Needs: No Transportation Needs (05/19/2020)   PRAPARE - Administrator, Civil Service (Medical): No    Lack of Transportation (Non-Medical): No  Physical Activity: Not on file  Stress: Not on file  Social Connections: Not on file   Past Surgical History:  Procedure Laterality Date   CESAREAN SECTION  1999;  07/ 2009   CYSTOSCOPY N/A 03/21/2020   Procedure: CYSTOSCOPY with biopsy;  Surgeon: Marguerita Beards, MD;  Location: [facility];  Service: Gynecology;  Laterality: N/A;  Total procedure time 1 hour   CYSTOSCOPY WITH URETHRAL DILATATION Right 05/2018   W/  TURBT   (right ureteral stricture)   DIAGNOSTIC LAPAROSCOPY  12/2016   DILATION AND CURETTAGE OF UTERUS  yrs ago   HEMICOLECTOMY  02/2017   fews days post op TAH for bowel obstruction   TOTAL ABDOMINAL HYSTERECTOMY  02/2017   Past Medical History:  Diagnosis Date   Bladder mass     Dysuria    Endometriosis    Endometriosis, bladder    Hematuria    History of 2019 novel coronavirus disease (COVID-19) 08/24/2019   positive covid test result in epic, per pt mild symptoms that resolved   History of small bowel obstruction 02/2017   2019 post op TAH  w/ surgical bowel resection   Lower urinary tract symptoms (LUTS)    Nephrolithiasis    per pt nonobstructive   Ht 5' (1.524 m)   LMP 08/11/2016 (Exact Date)   BMI 25.70 kg/m   Opioid Risk Score:   Fall Risk Score:  `1  Depression screen [facility] 2/9     07/05/2021   11:21 AM 06/06/2021   11:11 AM 04/11/2021   11:09 AM 02/02/2021    9:44 AM  Depression screen PHQ 2/9  Decreased Interest 0 0 0 3  Down, Depressed, Hopeless 0 1 0 0  PHQ - 2 Score 0 1 0 3  Altered sleeping    3  Tired, decreased energy    2  Change in appetite    0  Feeling bad or failure about yourself     0  Trouble concentrating    2  Moving slowly or fidgety/restless    0  Suicidal thoughts    0  PHQ-9 Score    10  Difficult doing work/chores    Very difficult      Review of Systems  Constitutional: Negative.   HENT: Negative.    Eyes: Negative.   Respiratory: Negative.    Cardiovascular: Negative.   Gastrointestinal:  Positive for abdominal pain.  Endocrine: Negative.   Genitourinary:  Positive for pelvic pain.  Musculoskeletal: Negative.   Skin: Negative.   Allergic/Immunologic: Negative.   Neurological: Negative.   Hematological: Negative.   All other systems reviewed and are negative.     Objective:   Physical Exam  Gen: no distress, normal appearing HEENT: oral mucosa pink and moist, NCAT Cardio: Reg rate Chest: normal effort, normal rate of breathing Abd: soft, non-distended Ext: no edema Psych: pleasant, normal affect, bright and cheerful Skin: intact Neuro: Alert and oriented x3 Musculoskeletal: Normal ambulation    Assessment & Plan:  1) Chronic abdominal pain secondary to endometriosis -Discussed current  symptoms of pain and history of pain.  -Discussed benefits of exercise in reducing pain. -Pain contact and urine sample signed previously -Percocet refilled -discussed plan for surgery, discussed that surgery team can take over post-surgical pain management and patient  can return to [place] monthly as needed for chronic pain afterward  -Discussed current symptoms of pain and history of pain.  -Discussed benefits of exercise in reducing pain. -Discussed following foods that may reduce pain: 1) Ginger (especially studied for arthritis)- reduce leukotriene production to decrease inflammation 2) Blueberries- high in phytonutrients that decrease inflammation 3) Salmon- marine omega-3s reduce joint swelling and pain 4) Pumpkin seeds- reduce inflammation 5) dark chocolate- reduces inflammation 6) turmeric- reduces inflammation 7) tart cherries - reduce pain and stiffness 8) extra virgin olive oil - its compound olecanthal helps to block prostaglandins  9) chili peppers- can be eaten or applied topically via capsaicin 10) mint- helpful for headache, muscle aches, joint pain, and itching 11) garlic- reduces inflammation  Link to further information on diet for chronic pain: [email or web address]   2) Depression: -discussed her prior depression, she has been able to get over this.  -encouraged her religious practice which has greatly helped her.   3) Constipation:  -Provided list of following foods that help with constipation and highlighted a few: 1) prunes- contain high amounts of fiber.  2) apples- has a form of dietary fiber called pectin that accelerates stool movement and increases beneficial gut bacteria 3) pears- in addition to fiber, also high in fructose and sorbitol which have laxative effect 4) figs- contain an enzyme ficin which helps to speed colonic transit 5) kiwis- contain an enzyme actinidin that improves gut  motility and reduces constipation 6) oranges- rich in pectin (like apples) 7) grapefruits- contain a flavanol naringenin which has a laxative effect 8) vegetables- rich in fiber and also great sources of folate, vitamin C, and K 9) artichoke- high in inulin, prebiotic great for the microbiome 10) chicory- increases stool frequency and softness (can be added to coffee) 11) rhubarb- laxative effect 12) sweet potato- high fiber 13) beans, peas, and lentils- contain both soluble and insoluble fiber 14) chia seeds- improves intestinal health and gut flora 15) flaxseeds- laxative effect 16) whole grain rye bread- high in fiber 17) oat bran- high in soluble and insoluble fiber 18) kefir- softens stools -recommended to try at least one of these foods every day.  -drink 6-8 glasses of water per day -walk regularly, especially after meals.

## 2021-09-12 ENCOUNTER — Ambulatory Visit
Admission: RE | Admit: 2021-09-12 | Discharge: 2021-09-12 | Disposition: A | Discharge status: Home / Self Care | Payer: BLUE CROSS/BLUE SHIELD | Source: Ambulatory Visit | Attending: Obstetrics | Admitting: Obstetrics

## 2021-09-12 DIAGNOSIS — Z1239 Encounter for other screening for malignant neoplasm of breast: Secondary | ICD-10-CM

## 2021-09-15 HISTORY — PX: BLADDER SURGERY: SHX569

## 2021-09-21 ENCOUNTER — Ambulatory Visit (INDEPENDENT_AMBULATORY_CARE_PROVIDER_SITE_OTHER): Payer: BLUE CROSS/BLUE SHIELD | Admitting: General Practice

## 2021-09-21 ENCOUNTER — Other Ambulatory Visit (HOSPITAL_COMMUNITY)
Admission: RE | Admit: 2021-09-21 | Discharge: 2021-09-21 | Disposition: A | Discharge status: Home / Self Care | Payer: BLUE CROSS/BLUE SHIELD | Source: Ambulatory Visit | Attending: Obstetrics & Gynecology | Admitting: Obstetrics & Gynecology

## 2021-09-21 VITALS — BP 130/80 | HR 85 | Ht 60.0 in | Wt 128.4 lb

## 2021-09-21 DIAGNOSIS — N898 Other specified noninflammatory disorders of vagina: Secondary | ICD-10-CM | POA: Diagnosis not present

## 2021-09-21 MED ORDER — FLUCONAZOLE 150 MG PO TABS
150.0000 mg | ORAL_TABLET | Freq: Once | ORAL | 0 refills | Status: AC
Start: 2021-09-21 — End: 2021-09-21

## 2021-09-21 NOTE — Progress Notes (Signed)
SUBJECTIVE:  41 y.o. female complains of curd-like vaginal discharge for 1 week(s). Denies abnormal vaginal bleeding or significant pelvic pain or fever. No UTI symptoms. Denies history of known exposure to STD. Pt c/o severe vaginal itch and recurring yeast infections.  Patient's last menstrual period was 08/11/2016 (exact date). Pt had hysterectomy 02/2017  OBJECTIVE:  She appears well, afebrile. Urine dipstick: not done.  ASSESSMENT:  Vaginal Discharge  Vaginal Odor   PLAN:  GC, chlamydia, trichomonas, BVAG, CVAG probe sent to lab. Treatment: Sent Diflucan to pharmacy per protocol ROV prn if symptoms persist or worsen.

## 2021-09-22 ENCOUNTER — Encounter: Payer: Self-pay | Admitting: Registered Nurse

## 2021-09-22 ENCOUNTER — Encounter: Payer: BLUE CROSS/BLUE SHIELD | Attending: Physical Medicine and Rehabilitation | Admitting: Registered Nurse

## 2021-09-22 VITALS — BP 111/77 | HR 87 | Ht 60.0 in | Wt 126.8 lb

## 2021-09-22 DIAGNOSIS — G894 Chronic pain syndrome: Secondary | ICD-10-CM | POA: Insufficient documentation

## 2021-09-22 DIAGNOSIS — R102 Pelvic and perineal pain: Secondary | ICD-10-CM | POA: Insufficient documentation

## 2021-09-22 DIAGNOSIS — Z79891 Long term (current) use of opiate analgesic: Secondary | ICD-10-CM | POA: Insufficient documentation

## 2021-09-22 DIAGNOSIS — Z5181 Encounter for therapeutic drug level monitoring: Secondary | ICD-10-CM | POA: Insufficient documentation

## 2021-09-22 LAB — CERVICOVAGINAL ANCILLARY ONLY
Bacterial Vaginitis (gardnerella): NEGATIVE
Candida Glabrata: NEGATIVE
Candida Vaginitis: NEGATIVE
Chlamydia: NEGATIVE
Comment: NEGATIVE
Comment: NEGATIVE
Comment: NEGATIVE
Comment: NEGATIVE
Comment: NEGATIVE
Comment: NORMAL
Neisseria Gonorrhea: NEGATIVE
Trichomonas: NEGATIVE

## 2021-09-22 MED ORDER — OXYCODONE-ACETAMINOPHEN 10-325 MG PO TABS: 1.0000 | ORAL_TABLET | Freq: Every day | ORAL | 0 refills | Status: DC | PRN

## 2021-09-22 NOTE — Progress Notes (Unsigned)
Subjective:    Patient ID: Christine Brooks, female    DOB: 07-Oct-1980, 41 y.o.   MRN: [id number]  HPI: MAHSA Brooks is a 41 y.o. female who returns for follow up appointment for chronic pain and medication refill. states *** pain is located in  ***. rates pain ***. current exercise regime is walking and performing stretching exercises.  Ms. Rilling Morphine equivalent is *** MME.   Last Oral Swab was Performed on 07/05/2021, it was consistent.    Pain Inventory Average Pain 10 Pain Right Now 0 My pain is sharp and stabbing  In the last 24 hours, has pain interfered with the following? General activity 5 Relation with others 5 Enjoyment of life 5 What TIME of day is your pain at its worst? varies Sleep (in general) Good  Pain is worse with: walking, bending, sitting, and standing Pain improves with: rest and medication Relief from Meds: 10  Family History  Problem Relation Age of Onset   Ovarian cancer Maternal Aunt    Colon cancer Cousin    Social History   Socioeconomic History   Marital status: Single    Spouse name: Not on file   Number of children: 2   Years of education: Not on file   Highest education level: Some college, no degree  Occupational History   Not on file  Tobacco Use   Smoking status: Former    Years: 2.00    Types: Cigarettes    Quit date: 03/17/2018    Years since quitting: 3.5    Passive exposure: Past   Smokeless tobacco: Never  Vaping Use   Vaping Use: Never used  Substance and Sexual Activity   Alcohol use: Not Currently    Alcohol/week: 0.0 standard drinks of alcohol    Comment: rare   Drug use: Never   Sexual activity: Not Currently    Partners: Male  Other Topics Concern   Not on file  Social History Narrative   Not on file   Social Determinants of Health   Financial Resource Strain: Not on file  Food Insecurity: Not on file  Transportation Needs: No Transportation Needs (05/19/2020)   PRAPARE - Scientist, research (physical sciences) (Medical): No    Lack of Transportation (Non-Medical): No  Physical Activity: Not on file  Stress: Not on file  Social Connections: Not on file   Past Surgical History:  Procedure Laterality Date   CESAREAN SECTION  1999;  07/ 2009   CYSTOSCOPY N/A 03/21/2020   Procedure: CYSTOSCOPY with biopsy;  Surgeon: Marguerita Beards, MD;  Location: [facility];  Service: Gynecology;  Laterality: N/A;  Total procedure time 1 hour   CYSTOSCOPY WITH URETHRAL DILATATION Right 05/2018   W/  TURBT   (right ureteral stricture)   DIAGNOSTIC LAPAROSCOPY  12/2016   DILATION AND CURETTAGE OF UTERUS  yrs ago   HEMICOLECTOMY  02/2017   fews days post op TAH for bowel obstruction   TOTAL ABDOMINAL HYSTERECTOMY  02/2017   Past Surgical History:  Procedure Laterality Date   CESAREAN SECTION  1999;  07/ 2009   CYSTOSCOPY N/A 03/21/2020   Procedure: CYSTOSCOPY with biopsy;  Surgeon: Marguerita Beards, MD;  Location: [facility];  Service: Gynecology;  Laterality: N/A;  Total procedure time 1 hour   CYSTOSCOPY WITH URETHRAL DILATATION Right 05/2018   W/  TURBT   (right ureteral stricture)   DIAGNOSTIC LAPAROSCOPY  12/2016   DILATION AND  CURETTAGE OF UTERUS  yrs ago   HEMICOLECTOMY  02/2017   fews days post op TAH for bowel obstruction   TOTAL ABDOMINAL HYSTERECTOMY  02/2017   Past Medical History:  Diagnosis Date   Bladder mass    Dysuria    Endometriosis    Endometriosis, bladder    Hematuria    History of 2019 novel coronavirus disease (COVID-19) 08/24/2019   positive covid test result in epic, per pt mild symptoms that resolved   History of small bowel obstruction 02/2017   2019 post op TAH  w/ surgical bowel resection   Lower urinary tract symptoms (LUTS)    Nephrolithiasis    per pt nonobstructive   BP 111/77   Pulse 87   Ht 5' (1.524 m)   Wt 126 lb 12.8 oz (57.5 kg)   LMP 08/11/2016 (Exact Date)   SpO2 99%   BMI 24.76 kg/m   Opioid  Risk Score:   Fall Risk Score:  `1  Depression screen [facility] 2/9     08/22/2021   10:15 AM 07/05/2021   11:21 AM 06/06/2021   11:11 AM 04/11/2021   11:09 AM 02/02/2021    9:44 AM  Depression screen PHQ 2/9  Decreased Interest 0 0 0 0 3  Down, Depressed, Hopeless 0 0 1 0 0  PHQ - 2 Score 0 0 1 0 3  Altered sleeping     3  Tired, decreased energy     2  Change in appetite     0  Feeling bad or failure about yourself      0  Trouble concentrating     2  Moving slowly or fidgety/restless     0  Suicidal thoughts     0  PHQ-9 Score     10  Difficult doing work/chores     Very difficult      Review of Systems  Musculoskeletal:        Right Hip pain and vaginal pain  All other systems reviewed and are negative.     Objective:   Physical Exam        Assessment & Plan:  Chronic abdominal pain secondary to endometriosis/ Pelvic Pain: Continue current medication regimen. Refilled : Oxycodone 5mg /325 mg one tablet daily as needed for pain. #30. We will continue the opioid monitoring program, this consists of regular clinic visits, examinations, urine drug screen, pill counts as well as use of Controlled Substance Reporting system. A 12 month History has been reviewed on the [place] Controlled Substance Reporting System on 07/06/2021 Chronic Pain Syndrome: Continue current medication . Continue HEP as Tolerated . Continue to Monitor. 07/06/2021   F/U in 1 month

## 2021-09-24 ENCOUNTER — Encounter: Payer: Self-pay | Admitting: Registered Nurse

## 2021-09-26 ENCOUNTER — Telehealth: Payer: Self-pay | Admitting: General Practice

## 2021-10-09 ENCOUNTER — Telehealth: Payer: Self-pay | Admitting: Registered Nurse

## 2021-10-09 MED ORDER — OXYCODONE-ACETAMINOPHEN 10-325 MG PO TABS: 1.0000 | ORAL_TABLET | Freq: Every day | ORAL | 0 refills | Status: DC | PRN

## 2021-10-09 NOTE — Telephone Encounter (Signed)
PMP was Reviewed. [facility] was called previous oxycodone prescription remove from profile.  New Oxycodone prescription sent to pharmacy.  Christine Brooks is aware via My-Chart message.

## 2021-10-17 ENCOUNTER — Other Ambulatory Visit: Payer: Self-pay | Admitting: Obstetrics and Gynecology

## 2021-10-17 DIAGNOSIS — N3281 Overactive bladder: Secondary | ICD-10-CM

## 2021-10-31 NOTE — Telephone Encounter (Signed)
Pt requesting 2nd Rx for Diflucan stating she requires treatment x2 to alleviate Sx of yeast. Lab results from 9/7 were negative. Per Mora Bellman, MD, pt advised to complete treatment with Diflucan and use OTC Monistat for vaginal itch. No Rx sent.

## 2021-11-24 ENCOUNTER — Ambulatory Visit: Payer: BLUE CROSS/BLUE SHIELD | Admitting: Obstetrics and Gynecology

## 2021-11-28 ENCOUNTER — Encounter: Payer: BLUE CROSS/BLUE SHIELD | Attending: Physical Medicine and Rehabilitation | Admitting: Registered Nurse

## 2021-11-28 ENCOUNTER — Encounter: Payer: Self-pay | Admitting: Registered Nurse

## 2021-11-28 VITALS — BP 123/80 | HR 98 | Ht 60.0 in | Wt 132.0 lb

## 2021-11-28 DIAGNOSIS — R102 Pelvic and perineal pain: Secondary | ICD-10-CM | POA: Diagnosis not present

## 2021-11-28 NOTE — Progress Notes (Unsigned)
Subjective:    Patient ID: Christine Brooks, female    DOB: January 17, 1980, 41 y.o.   MRN: [id number]  HPI: Christine Brooks is a 41 y.o. female who returns for follow up appointment for chronic pain and medication refill. states *** pain is located in  ***. rates pain ***. current exercise regime is walking and performing stretching exercises.  Morphine equivalent is *** MME.   Last oral Swab was performed on 07/05/2021, it was consistent.      Pain Inventory Average Pain 0 Pain Right Now 0 My pain is intermittent, sharp, burning, and aching  In the last 24 hours, has pain interfered with the following? General activity 0 Relation with others 0 Enjoyment of life 0 What TIME of day is your pain at its worst? varies Sleep (in general) Good  Pain is worse with: unsure Pain improves with: medication Relief from Meds: 10  Family History  Problem Relation Age of Onset   Ovarian cancer Maternal Aunt    Colon cancer Cousin    Social History   Socioeconomic History   Marital status: Single    Spouse name: Not on file   Number of children: 2   Years of education: Not on file   Highest education level: Some college, no degree  Occupational History   Not on file  Tobacco Use   Smoking status: Former    Years: 2.00    Types: Cigarettes    Quit date: 03/17/2018    Years since quitting: 3.7    Passive exposure: Past   Smokeless tobacco: Never  Vaping Use   Vaping Use: Never used  Substance and Sexual Activity   Alcohol use: Not Currently    Alcohol/week: 0.0 standard drinks of alcohol    Comment: rare   Drug use: Never   Sexual activity: Not Currently    Partners: Male  Other Topics Concern   Not on file  Social History Narrative   Not on file   Social Determinants of Health   Financial Resource Strain: Not on file  Food Insecurity: Not on file  Transportation Needs: No Transportation Needs (05/19/2020)   PRAPARE - Administrator, Civil Service (Medical): No     Lack of Transportation (Non-Medical): No  Physical Activity: Not on file  Stress: Not on file  Social Connections: Not on file   Past Surgical History:  Procedure Laterality Date   CESAREAN SECTION  1999;  07/ 2009   CYSTOSCOPY N/A 03/21/2020   Procedure: CYSTOSCOPY with biopsy;  Surgeon: Marguerita Beards, MD;  Location: [facility];  Service: Gynecology;  Laterality: N/A;  Total procedure time 1 hour   CYSTOSCOPY WITH URETHRAL DILATATION Right 05/2018   W/  TURBT   (right ureteral stricture)   DIAGNOSTIC LAPAROSCOPY  12/2016   DILATION AND CURETTAGE OF UTERUS  yrs ago   HEMICOLECTOMY  02/2017   fews days post op TAH for bowel obstruction   TOTAL ABDOMINAL HYSTERECTOMY  02/2017   Past Surgical History:  Procedure Laterality Date   CESAREAN SECTION  1999;  07/ 2009   CYSTOSCOPY N/A 03/21/2020   Procedure: CYSTOSCOPY with biopsy;  Surgeon: Marguerita Beards, MD;  Location: [facility];  Service: Gynecology;  Laterality: N/A;  Total procedure time 1 hour   CYSTOSCOPY WITH URETHRAL DILATATION Right 05/2018   W/  TURBT   (right ureteral stricture)   DIAGNOSTIC LAPAROSCOPY  12/2016   DILATION AND CURETTAGE OF UTERUS  yrs ago   HEMICOLECTOMY  02/2017   fews days post op TAH for bowel obstruction   TOTAL ABDOMINAL HYSTERECTOMY  02/2017   Past Medical History:  Diagnosis Date   Bladder mass    Dysuria    Endometriosis    Endometriosis, bladder    Hematuria    History of 2019 novel coronavirus disease (COVID-19) 08/24/2019   positive covid test result in epic, per pt mild symptoms that resolved   History of small bowel obstruction 02/2017   2019 post op TAH  w/ surgical bowel resection   Lower urinary tract symptoms (LUTS)    Nephrolithiasis    per pt nonobstructive   LMP 08/11/2016 (Exact Date)   Opioid Risk Score:   Fall Risk Score:  `1  Depression screen [facility] 2/9     08/22/2021   10:15 AM 07/05/2021   11:21 AM 06/06/2021   11:11 AM  04/11/2021   11:09 AM 02/02/2021    9:44 AM  Depression screen PHQ 2/9  Decreased Interest 0 0 0 0 3  Down, Depressed, Hopeless 0 0 1 0 0  PHQ - 2 Score 0 0 1 0 3  Altered sleeping     3  Tired, decreased energy     2  Change in appetite     0  Feeling bad or failure about yourself      0  Trouble concentrating     2  Moving slowly or fidgety/restless     0  Suicidal thoughts     0  PHQ-9 Score     10  Difficult doing work/chores     Very difficult    Review of Systems  Genitourinary:        Left side pelvis pain  All other systems reviewed and are negative.      Objective:   Physical Exam        Assessment & Plan:  Chronic abdominal pain secondary to endometriosis/ Pelvic Pain: Scheduled for Surgery on 09/28/2021. Continue current medication regimen. Refilled : Oxycodone 5mg /325 mg one tablet daily as needed for pain. #30. We will continue the opioid monitoring program, this consists of regular clinic visits, examinations, urine drug screen, pill counts as well as use of Controlled Substance Reporting system. A 12 month History has been reviewed on the [place] Controlled Substance Reporting System on 09/22/2021 Chronic Pain Syndrome: Continue current medication . Continue HEP as Tolerated . Continue to Monitor. 09/22/2021   F/U in 2 months

## 2021-12-13 ENCOUNTER — Other Ambulatory Visit: Payer: Self-pay | Admitting: Obstetrics

## 2021-12-13 DIAGNOSIS — N809 Endometriosis, unspecified: Secondary | ICD-10-CM

## 2021-12-14 ENCOUNTER — Telehealth: Payer: Self-pay | Admitting: Registered Nurse

## 2021-12-14 MED ORDER — OXYCODONE-ACETAMINOPHEN 10-325 MG PO TABS: 1.0000 | ORAL_TABLET | Freq: Every day | ORAL | 0 refills | Status: DC | PRN

## 2021-12-14 NOTE — Telephone Encounter (Signed)
PMP Reviewed.  Oxycodone e-scribed today.  Discussed with Dr Carlis Abbott Ms. Noffsinger My Chart message: regarding her pain, we will resume her Oxycodone and she will reschedule her appointment to January. Awaiting her MY- Chart reply.

## 2021-12-29 ENCOUNTER — Encounter: Payer: Self-pay | Admitting: Obstetrics

## 2021-12-29 ENCOUNTER — Other Ambulatory Visit (HOSPITAL_COMMUNITY)
Admission: RE | Admit: 2021-12-29 | Discharge: 2021-12-29 | Disposition: A | Discharge status: Home / Self Care | Payer: BLUE CROSS/BLUE SHIELD | Source: Ambulatory Visit | Attending: Obstetrics and Gynecology | Admitting: Obstetrics and Gynecology

## 2021-12-29 ENCOUNTER — Ambulatory Visit (INDEPENDENT_AMBULATORY_CARE_PROVIDER_SITE_OTHER): Payer: BLUE CROSS/BLUE SHIELD | Admitting: Obstetrics

## 2021-12-29 VITALS — BP 121/79 | HR 83 | Ht 60.0 in | Wt 130.5 lb

## 2021-12-29 DIAGNOSIS — N898 Other specified noninflammatory disorders of vagina: Secondary | ICD-10-CM | POA: Insufficient documentation

## 2021-12-29 DIAGNOSIS — Z113 Encounter for screening for infections with a predominantly sexual mode of transmission: Secondary | ICD-10-CM

## 2021-12-29 DIAGNOSIS — Z0000 Encounter for general adult medical examination without abnormal findings: Secondary | ICD-10-CM

## 2021-12-29 DIAGNOSIS — N809 Endometriosis, unspecified: Secondary | ICD-10-CM

## 2021-12-29 DIAGNOSIS — Z01419 Encounter for gynecological examination (general) (routine) without abnormal findings: Secondary | ICD-10-CM | POA: Diagnosis not present

## 2021-12-29 DIAGNOSIS — Z90710 Acquired absence of both cervix and uterus: Secondary | ICD-10-CM

## 2021-12-29 DIAGNOSIS — B379 Candidiasis, unspecified: Secondary | ICD-10-CM

## 2021-12-29 DIAGNOSIS — R52 Pain, unspecified: Secondary | ICD-10-CM

## 2021-12-29 MED ORDER — FLUCONAZOLE 200 MG PO TABS: 200.0000 mg | ORAL_TABLET | ORAL | 2 refills | Status: DC

## 2021-12-29 MED ORDER — IBUPROFEN 800 MG PO TABS: 800.0000 mg | ORAL_TABLET | Freq: Three times a day (TID) | ORAL | 5 refills | Status: DC | PRN

## 2021-12-29 MED ORDER — NORETHINDRONE ACETATE 5 MG PO TABS: 5.0000 mg | ORAL_TABLET | Freq: Every day | ORAL | 11 refills | Status: AC

## 2021-12-29 NOTE — Progress Notes (Signed)
Patient presents for AEX. Patient desires to have blood work. Complains of having vaginal itching for about 2-3 days.  Last Mm: 09/14/21 Normal

## 2021-12-29 NOTE — Progress Notes (Signed)
Subjective:        Christine Brooks is a 41 y.o. female here for a routine exam.  Current complaints: Vaginal discharge.    Personal health questionnaire:  Is patient [name], have a family history of breast and/or ovarian cancer: yes Is there a family history of uterine cancer diagnosed at age < 28, gastrointestinal cancer, urinary tract cancer, family member who is a Personnel officer syndrome-associated carrier: yes Is the patient overweight and hypertensive, family history of diabetes, personal history of gestational diabetes, preeclampsia or PCOS: no Is patient over 61, have PCOS,  family history of premature CHD under age 41, diabetes, smoke, have hypertension or peripheral artery disease:  no At any time, has a partner hit, kicked or otherwise hurt or frightened you?: no Over the past 2 weeks, have you felt down, depressed or hopeless?: no Over the past 2 weeks, have you felt little interest or pleasure in doing things?:no   Gynecologic History Patient's last menstrual period was 08/11/2016 (exact date). Contraception: status post hysterectomy Last Pap: 20`17. Results were: normal Last mammogram: 2023. Results were: normal  Obstetric History OB History  Gravida Para Term Preterm AB Living  3 2 2   1 2   SAB IAB Ectopic Multiple Live Births    1     2    # Outcome Date GA Lbr Len/2nd Weight Sex Delivery Anes PTL Lv  3 IAB 06/16/14          2 Term 07/29/07    M CS-LTranv   LIV  1 Term 12/07/97 [redacted]w[redacted]d   M CS-LTranv   LIV    Past Medical History:  Diagnosis Date   Bladder mass    Dysuria    Endometriosis    Endometriosis, bladder    Hematuria    History of 2019 novel coronavirus disease (COVID-19) 08/24/2019   positive covid test result in epic, per pt mild symptoms that resolved   History of small bowel obstruction 02/2017   2019 post op TAH  w/ surgical bowel resection   Lower urinary tract symptoms (LUTS)    Nephrolithiasis    per pt nonobstructive    Past  Surgical History:  Procedure Laterality Date   BLADDER SURGERY  09/2021   Endometriosis   CESAREAN SECTION  1999;  07/ 2009   CYSTOSCOPY N/A 03/21/2020   Procedure: CYSTOSCOPY with biopsy;  Surgeon: 05/21/2020, MD;  Location: [facility];  Service: Gynecology;  Laterality: N/A;  Total procedure time 1 hour   CYSTOSCOPY WITH URETHRAL DILATATION Right 05/2018   W/  TURBT   (right ureteral stricture)   DIAGNOSTIC LAPAROSCOPY  12/2016   DILATION AND CURETTAGE OF UTERUS  yrs ago   HEMICOLECTOMY  02/2017   fews days post op TAH for bowel obstruction   TOTAL ABDOMINAL HYSTERECTOMY  02/2017     Current Outpatient Medications:    acetaminophen (TYLENOL) 325 MG tablet, Take by mouth., Disp: , Rfl:    norethindrone-ethinyl estradiol (JUNEL 1/20) 1-20 MG-MCG tablet, TAKE 1 TABLET BY MOUTH DAILY. DO NOT TAKE INACTIVE PILLS., Disp: 21 tablet, Rfl: 0   oxyCODONE-acetaminophen (PERCOCET) 10-325 MG tablet, Take 1 tablet by mouth daily as needed for pain., Disp: 30 tablet, Rfl: 0   Vibegron (GEMTESA) 75 MG TABS, Take by mouth., Disp: , Rfl:    ibuprofen (ADVIL) 600 MG tablet, Take by mouth. (Patient not taking: Reported on 11/28/2021), Disp: , Rfl:   Current Facility-Administered Medications:    sodium bicarbonate  4.2 % injection 2.5 mEq, 2.5 mEq, Intravenous, Once, Marguerita Beards, MD No Known Allergies  Social History   Tobacco Use   Smoking status: Former    Years: 2.00    Types: Cigarettes    Quit date: 03/17/2018    Years since quitting: 3.7    Passive exposure: Past   Smokeless tobacco: Never  Substance Use Topics   Alcohol use: Yes    Comment: rare    Family History  Problem Relation Age of Onset   Ovarian cancer Maternal Aunt    Colon cancer Cousin       Review of Systems  Constitutional: negative for fatigue and weight loss Respiratory: negative for cough and wheezing Cardiovascular: negative for chest pain, fatigue and  palpitations Gastrointestinal: negative for abdominal pain and change in bowel habits Musculoskeletal:negative for myalgias Neurological: negative for gait problems and tremors Behavioral/Psych: negative for abusive relationship, depression Endocrine: negative for temperature intolerance    Genitourinary:negative for abnormal menstrual periods, genital lesions, hot flashes, sexual problems and vaginal discharge Integument/breast: negative for breast lump, breast tenderness, nipple discharge and skin lesion(s)    Objective:       BP 121/79   Pulse 83   Ht 5' (1.524 m)   Wt 130 lb 8 oz (59.2 kg)   LMP 08/11/2016 (Exact Date)   BMI 25.49 kg/m  General:   Alert and no distress  Skin:   no rash or abnormalities  Lungs:   clear to auscultation bilaterally  Heart:   regular rate and rhythm, S1, S2 normal, no murmur, click, rub or gallop  Breasts:   normal without suspicious masses, skin or nipple changes or axillary nodes  Abdomen:  normal findings: no organomegaly, soft, non-tender and no hernia  Pelvis:  External genitalia: normal general appearance Urinary system: urethral meatus normal and bladder without fullness, nontender Vaginal: normal without tenderness, induration or masses Cervix: absent Adnexa: normal bimanual exam Uterus: absent   Lab Review Urine pregnancy test Labs reviewed yes Radiologic studies reviewed yes  I have spent a total of 20 minutes of face-to-face time, excluding clinical staff time, reviewing notes and preparing to see patient, ordering tests and/or medications, and counseling the patient.   Assessment:    1. Encounter for annual routine gynecological examination - doing well  2. Vaginal discharge Rx: - Cervicovaginal ancillary only( [place])  3. Screening for STD (sexually transmitted disease) Rx: - RPR - Hepatitis B surface antigen - Hepatitis C antibody - HIV Antibody (routine testing w rflx)  4. Endometriosis determined by  laparoscopy Rx: - norethindrone (AYGESTIN) 5 MG tablet; Take 1 tablet (5 mg total) by mouth daily.  Dispense: 30 tablet; Refill: 11  5. H/O total hysterectomy for endometriosis  6. Routine adult health maintenance Rx: - VITAMIN D 25 Hydroxy (Vit-D Deficiency, Fractures) - Ambulatory referral to Internal Medicine  7. Candida albicans infection Rx: - fluconazole (DIFLUCAN) 200 MG tablet; Take 1 tablet (200 mg total) by mouth every 3 (three) days.  Dispense: 3 tablet; Refill: 2  8. Pain Rx: - ibuprofen (ADVIL) 800 MG tablet; Take 1 tablet (800 mg total) by mouth every 8 (eight) hours as needed.  Dispense: 30 tablet; Refill: 5      Plan:    Education reviewed: calcium supplements, depression evaluation, low fat, low cholesterol diet, safe sex/STD prevention, self breast exams, and weight bearing exercise. Follow up in: 1 year.   No orders of the defined types were placed in this encounter.  Orders  Placed This Encounter  Procedures   RPR   Hepatitis B surface antigen   Hepatitis C antibody   HIV Antibody (routine testing w rflx)   VITAMIN D 25 Hydroxy (Vit-D Deficiency, Fractures)   Ambulatory referral to Internal Medicine    Referral Priority:   Routine    Referral Type:   Consultation    Referral Reason:   Specialty Services Required    Requested Specialty:   Internal Medicine    Number of Visits Requested:   1     Rollie Hynek A. Clearance Coots MD 12/29/21

## 2021-12-30 LAB — HEPATITIS B SURFACE ANTIGEN: Hepatitis B Surface Ag: NEGATIVE

## 2021-12-30 LAB — RPR: RPR Ser Ql: NONREACTIVE

## 2021-12-30 LAB — HEPATITIS C ANTIBODY: Hep C Virus Ab: NONREACTIVE

## 2021-12-30 LAB — HIV ANTIBODY (ROUTINE TESTING W REFLEX): HIV Screen 4th Generation wRfx: NONREACTIVE

## 2021-12-30 LAB — VITAMIN D 25 HYDROXY (VIT D DEFICIENCY, FRACTURES): Vit D, 25-Hydroxy: 6.4 ng/mL — ABNORMAL LOW (ref 30.0–100.0)

## 2022-01-01 ENCOUNTER — Other Ambulatory Visit: Payer: Self-pay | Admitting: Obstetrics

## 2022-01-01 DIAGNOSIS — E559 Vitamin D deficiency, unspecified: Secondary | ICD-10-CM

## 2022-01-01 DIAGNOSIS — N809 Endometriosis, unspecified: Secondary | ICD-10-CM

## 2022-01-01 LAB — CERVICOVAGINAL ANCILLARY ONLY
Bacterial Vaginitis (gardnerella): NEGATIVE
Candida Glabrata: NEGATIVE
Candida Vaginitis: NEGATIVE
Chlamydia: NEGATIVE
Comment: NEGATIVE
Comment: NEGATIVE
Comment: NEGATIVE
Comment: NEGATIVE
Comment: NEGATIVE
Comment: NORMAL
Neisseria Gonorrhea: NEGATIVE
Trichomonas: NEGATIVE

## 2022-01-01 MED ORDER — VITAMIN D (ERGOCALCIFEROL) 1.25 MG (50000 UNIT) PO CAPS: 50000.0000 [IU] | ORAL_CAPSULE | ORAL | 0 refills | Status: DC

## 2022-01-02 ENCOUNTER — Other Ambulatory Visit: Payer: Self-pay | Admitting: Emergency Medicine

## 2022-01-20 ENCOUNTER — Other Ambulatory Visit: Payer: Self-pay | Admitting: Obstetrics

## 2022-01-20 DIAGNOSIS — N809 Endometriosis, unspecified: Secondary | ICD-10-CM

## 2022-01-23 ENCOUNTER — Encounter: Payer: BLUE CROSS/BLUE SHIELD | Admitting: Registered Nurse

## 2022-02-02 ENCOUNTER — Encounter: Payer: BLUE CROSS/BLUE SHIELD | Attending: Physical Medicine and Rehabilitation | Admitting: Registered Nurse

## 2022-02-02 ENCOUNTER — Encounter: Payer: Self-pay | Admitting: Registered Nurse

## 2022-02-02 ENCOUNTER — Encounter: Payer: Self-pay | Admitting: Obstetrics

## 2022-02-02 VITALS — BP 133/85 | HR 74 | Ht 60.0 in | Wt 130.0 lb

## 2022-02-02 DIAGNOSIS — Z5181 Encounter for therapeutic drug level monitoring: Secondary | ICD-10-CM | POA: Insufficient documentation

## 2022-02-02 DIAGNOSIS — R102 Pelvic and perineal pain: Secondary | ICD-10-CM | POA: Insufficient documentation

## 2022-02-02 DIAGNOSIS — R1013 Epigastric pain: Secondary | ICD-10-CM | POA: Insufficient documentation

## 2022-02-02 DIAGNOSIS — Z79891 Long term (current) use of opiate analgesic: Secondary | ICD-10-CM | POA: Diagnosis present

## 2022-02-02 DIAGNOSIS — G894 Chronic pain syndrome: Secondary | ICD-10-CM

## 2022-02-02 DIAGNOSIS — G8929 Other chronic pain: Secondary | ICD-10-CM | POA: Insufficient documentation

## 2022-02-02 MED ORDER — OXYCODONE-ACETAMINOPHEN 10-325 MG PO TABS: 1.0000 | ORAL_TABLET | Freq: Every day | ORAL | 0 refills | Status: DC | PRN

## 2022-02-02 NOTE — Progress Notes (Signed)
Subjective:    Patient ID: Christine Brooks, female    DOB: 04-06-1980, 42 y.o.   MRN: [id number]  HPI: Christine Brooks is a 42 y.o. female who returns for follow up appointment for chronic pain and medication refill. She states her pain is located in her abdomen ( Mainly left side) and pelvic pressure GYN following. She rates her pain 0 on Health and History form. Her current exercise regime is walking and performing stretching exercises.  Christine Brooks Morphine equivalent is 15.00 MME.   UDS ordered     Pain Inventory Average Pain 5 Pain Right Now 0 My pain is sharp and stabbing  In the last 24 hours, has pain interfered with the following? General activity 0 Relation with others 0 Enjoyment of life 0 What TIME of day is your pain at its worst? varies Sleep (in general) Fair  Pain is worse with: walking, bending, sitting, inactivity, standing, and some activites Pain improves with: rest and medication Relief from Meds: 10  Family History  Problem Relation Age of Onset   Ovarian cancer Maternal Aunt    Colon cancer Cousin    Social History   Socioeconomic History   Marital status: Single    Spouse name: Not on file   Number of children: 2   Years of education: Not on file   Highest education level: Some college, no degree  Occupational History   Not on file  Tobacco Use   Smoking status: Former    Years: 2.00    Types: Cigarettes    Quit date: 03/17/2018    Years since quitting: 3.8    Passive exposure: Past   Smokeless tobacco: Never  Vaping Use   Vaping Use: Never used  Substance and Sexual Activity   Alcohol use: Yes    Comment: rare   Drug use: Never   Sexual activity: Not Currently    Partners: Male    Birth control/protection: Pill  Other Topics Concern   Not on file  Social History Narrative   Not on file   Social Determinants of Health   Financial Resource Strain: Not on file  Food Insecurity: Not on file  Transportation Needs: No Transportation  Needs (05/19/2020)   PRAPARE - Administrator, Civil Service (Medical): No    Lack of Transportation (Non-Medical): No  Physical Activity: Not on file  Stress: Not on file  Social Connections: Not on file   Past Surgical History:  Procedure Laterality Date   BLADDER SURGERY  09/2021   Endometriosis   CESAREAN SECTION  1999;  07/ 2009   CYSTOSCOPY N/A 03/21/2020   Procedure: CYSTOSCOPY with biopsy;  Surgeon: Marguerita Beards, MD;  Location: [facility];  Service: Gynecology;  Laterality: N/A;  Total procedure time 1 hour   CYSTOSCOPY WITH URETHRAL DILATATION Right 05/2018   W/  TURBT   (right ureteral stricture)   DIAGNOSTIC LAPAROSCOPY  12/2016   DILATION AND CURETTAGE OF UTERUS  yrs ago   HEMICOLECTOMY  02/2017   fews days post op TAH for bowel obstruction   TOTAL ABDOMINAL HYSTERECTOMY  02/2017   Past Surgical History:  Procedure Laterality Date   BLADDER SURGERY  09/2021   Endometriosis   CESAREAN SECTION  1999;  07/ 2009   CYSTOSCOPY N/A 03/21/2020   Procedure: CYSTOSCOPY with biopsy;  Surgeon: Marguerita Beards, MD;  Location: [facility];  Service: Gynecology;  Laterality: N/A;  Total procedure time 1 hour  CYSTOSCOPY WITH URETHRAL DILATATION Right 05/2018   W/  TURBT   (right ureteral stricture)   DIAGNOSTIC LAPAROSCOPY  12/2016   DILATION AND CURETTAGE OF UTERUS  yrs ago   HEMICOLECTOMY  02/2017   fews days post op TAH for bowel obstruction   TOTAL ABDOMINAL HYSTERECTOMY  02/2017   Past Medical History:  Diagnosis Date   Bladder mass    Dysuria    Endometriosis    Endometriosis, bladder    Hematuria    History of 2019 novel coronavirus disease (COVID-19) 08/24/2019   positive covid test result in epic, per pt mild symptoms that resolved   History of small bowel obstruction 02/2017   2019 post op TAH  w/ surgical bowel resection   Lower urinary tract symptoms (LUTS)    Nephrolithiasis    per pt nonobstructive    LMP 08/11/2016 (Exact Date)   Opioid Risk Score:   Fall Risk Score:  `1  Depression screen [facility] 2/9     12/29/2021   10:36 AM 11/28/2021   11:06 AM 08/22/2021   10:15 AM 07/05/2021   11:21 AM 06/06/2021   11:11 AM 04/11/2021   11:09 AM 02/02/2021    9:44 AM  Depression screen PHQ 2/9  Decreased Interest 0 1 0 0 0 0 3  Down, Depressed, Hopeless 0 1 0 0 1 0 0  PHQ - 2 Score 0 2 0 0 1 0 3  Altered sleeping 0      3  Tired, decreased energy 0      2  Change in appetite 0      0  Feeling bad or failure about yourself  0      0  Trouble concentrating 0      2  Moving slowly or fidgety/restless 0      0  Suicidal thoughts 0      0  PHQ-9 Score 0      10  Difficult doing work/chores       Very difficult     Review of Systems  Musculoskeletal:        Left hip pain Pelvic pain   All other systems reviewed and are negative.     Objective:   Physical Exam Vitals and nursing note reviewed.  Constitutional:      Appearance: Normal appearance.  Cardiovascular:     Rate and Rhythm: Normal rate and regular rhythm.     Pulses: Normal pulses.     Heart sounds: Normal heart sounds.  Pulmonary:     Effort: Pulmonary effort is normal.     Breath sounds: Normal breath sounds.  Musculoskeletal:     Cervical back: Normal range of motion and neck supple.     Comments: Normal Muscle Bulk and Muscle Testing Reveals:  Upper Extremities: Full ROM and Muscle Strength 5/5  Lower Extremities: Full ROM and Muscle Strength 5/5 Arises from Table with ease Narrow Based  Gait     Skin:    General: Skin is warm and dry.  Neurological:     Mental Status: She is alert and oriented to person, place, and time.  Psychiatric:        Mood and Affect: Mood normal.        Behavior: Behavior normal.         Assessment & Plan:  Chronic abdominal pain secondary to endometriosis/ Pelvic Pain: S/P PR LAP,FULGURATE/EXCISE LESIONS  PR EXC TUMOR SOFT TISSUE ABDOMINAL WALL SUBQ <3CM  PR  CYSTOURETHROSCOPY  PR PART REMV BLADDER,SIMPLE  LAPAROSCOPY, SURGICAL; W/FULGURATION OR EXCISION OF LESIONS OVARY,  PELVIC VISCERA, PERITONEAL SURFACE  EXCISION, TUMOR, SOFT TISSUE OF ABDOMINAL WALL, SUBCUTANEOUS; LESS  THAN 3 CM S/P on 09/28/2021.Dr Kathyrn Drown.  Continue  to Monitor.  2.  Chronic abdominal pain secondary to endometriosis/ Pelvic Pain: S/PSurgery on 09/28/2021, GYN following. Continue current medication regimen. Refilled : Oxycodone 5mg /325 mg one tablet daily as needed for pain. #30. We will continue the opioid monitoring program, this consists of regular clinic visits, examinations, urine drug screen, pill counts as well as use of [place] Controlled Substance Reporting system. A 12 month History has been reviewed on the [place] Controlled Substance Reporting System on 02/02/2022 3. Chronic Pain Syndrome: Continue current medication . Continue HEP as Tolerated . Continue to Monitor. 02/02/2022  F/U in 2 months

## 2022-02-04 ENCOUNTER — Encounter: Payer: Self-pay | Admitting: Registered Nurse

## 2022-02-05 ENCOUNTER — Other Ambulatory Visit: Payer: Self-pay | Admitting: Obstetrics

## 2022-02-05 DIAGNOSIS — E559 Vitamin D deficiency, unspecified: Secondary | ICD-10-CM

## 2022-02-05 DIAGNOSIS — N809 Endometriosis, unspecified: Secondary | ICD-10-CM

## 2022-02-05 MED ORDER — VITAMIN D 50 MCG (2000 UT) PO CAPS: 1.0000 | ORAL_CAPSULE | Freq: Every day | ORAL | 11 refills | Status: AC

## 2022-02-20 ENCOUNTER — Ambulatory Visit: Payer: BLUE CROSS/BLUE SHIELD | Admitting: Registered Nurse

## 2022-02-28 IMAGING — US US PELVIS COMPLETE WITH TRANSVAGINAL
1 series · 15 of 25 positions shown · non-contrast
Comparison: 03/08/2020

CLINICAL DATA: Recurrent dysuria, history of bladder mass,
hysterectomy, history of endometriosis invading the urinary bladder,
hemicolectomy, Caesarean section, D&C



[Series 1: us pelvis complete with transvaginal · 63 acquisitions, 15 frames shown]
[im 1/63]
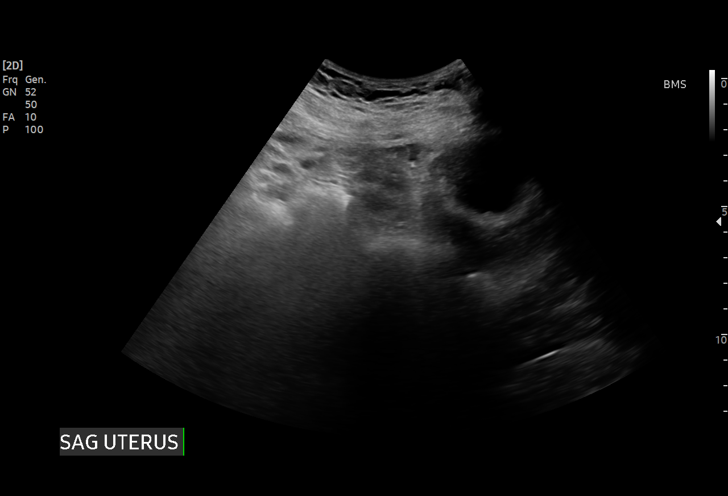
[im 6/63]
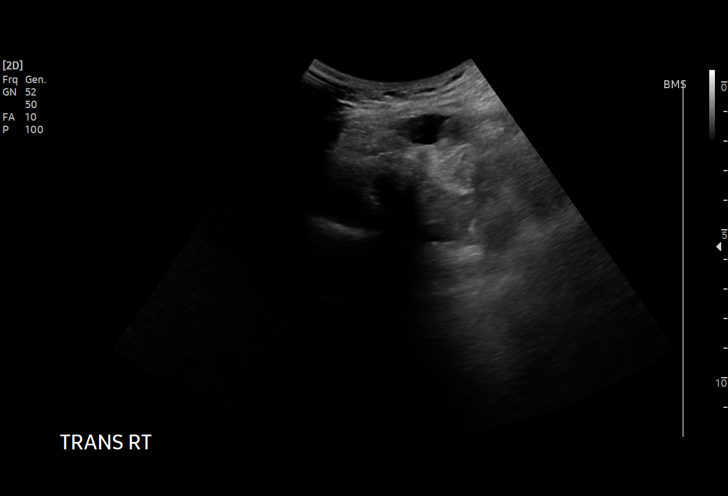
[im 11/63]
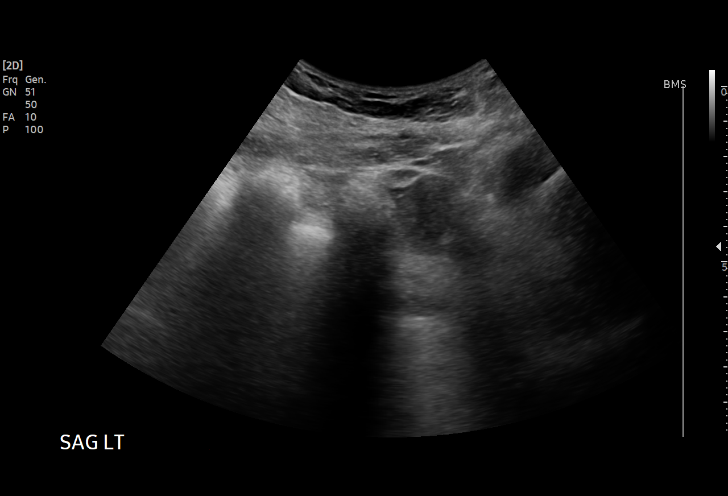
[im 13/63]
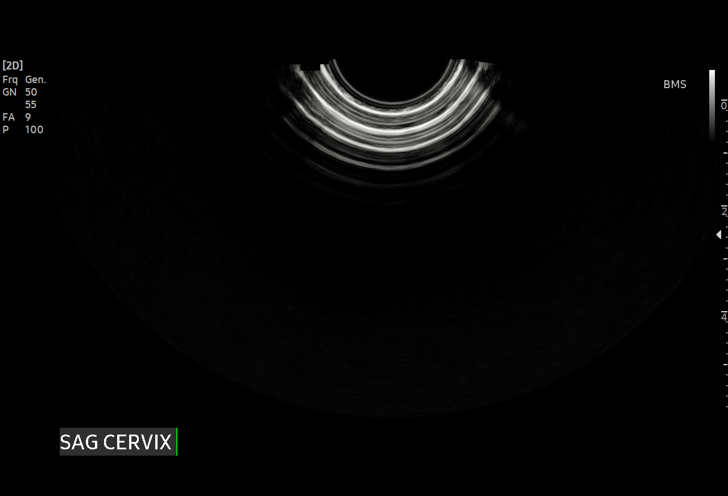
[im 19/63]
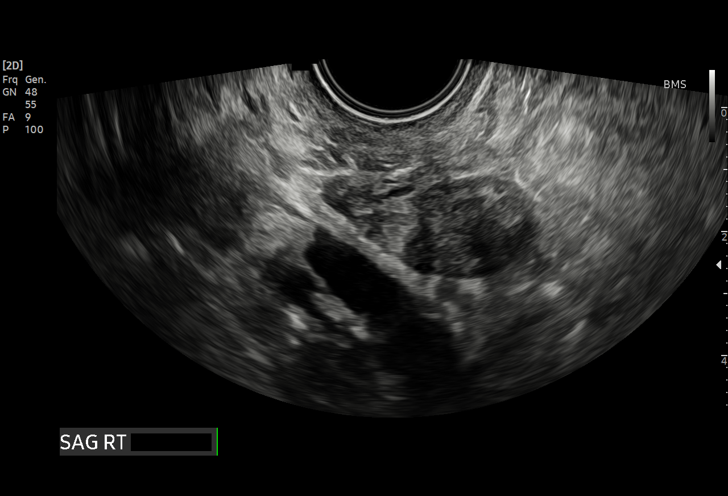
[im 24/63]
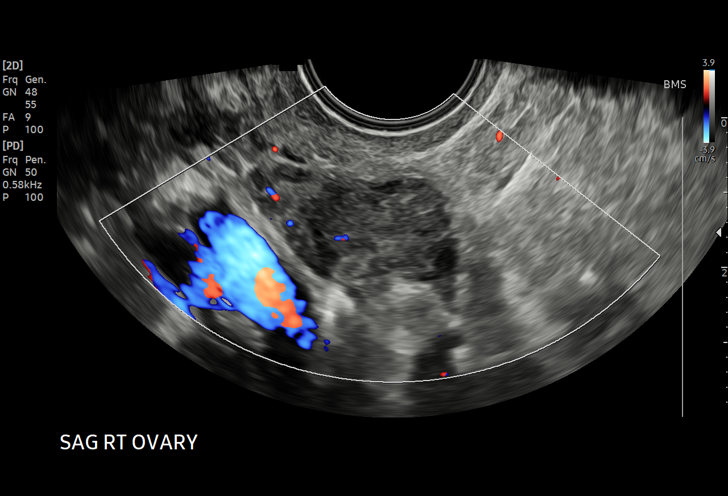
[im 26/63]
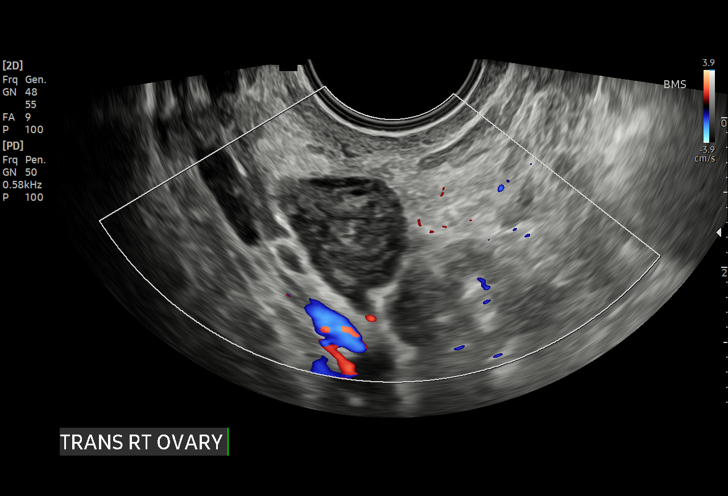
[im 32/63]
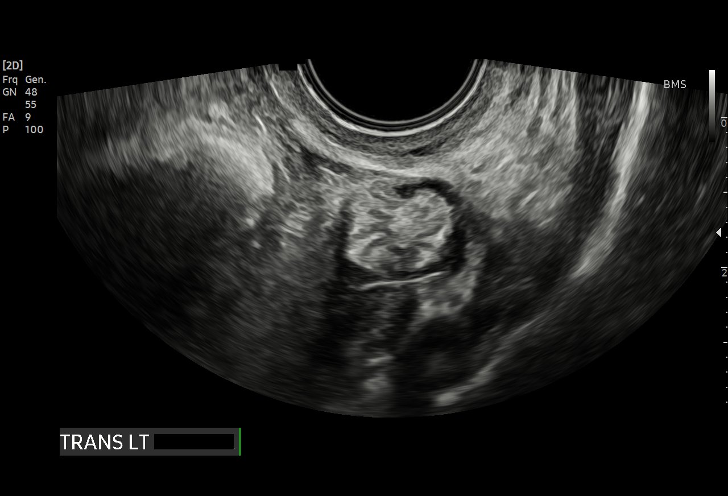
[im 37/63]
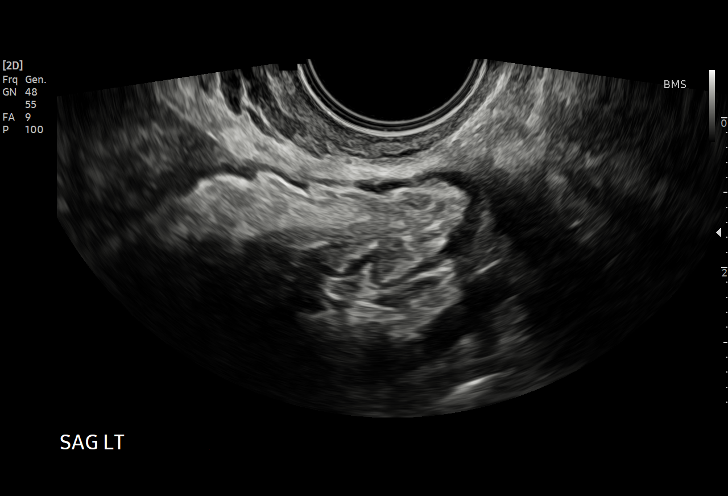
[im 39/63]
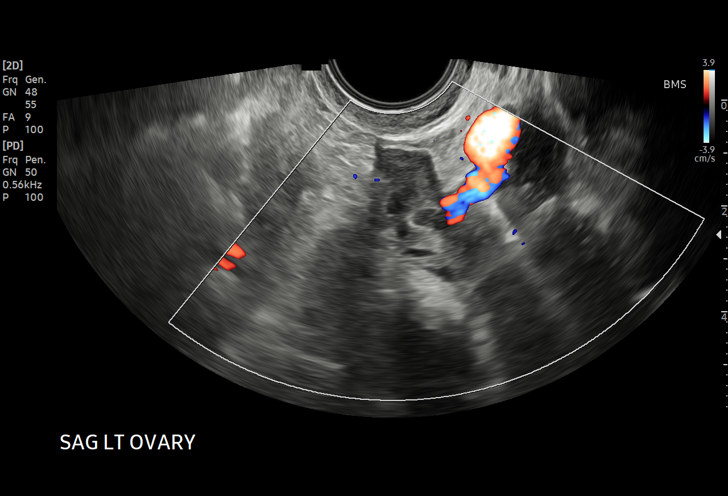
[im 44/63]
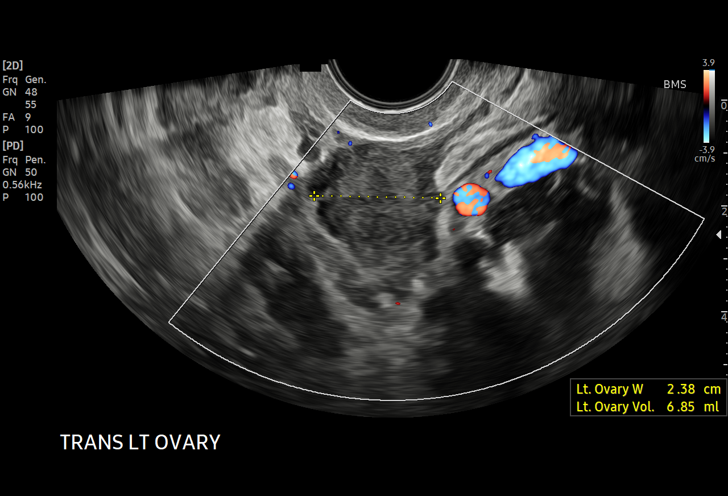
[im 50/63]
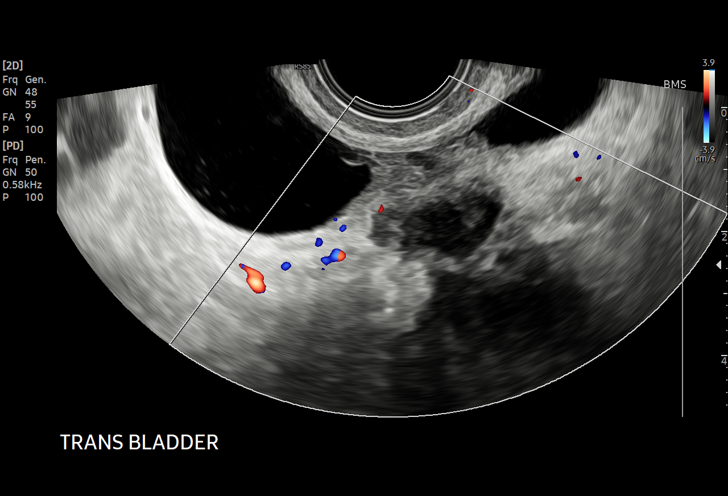
[im 52/63]
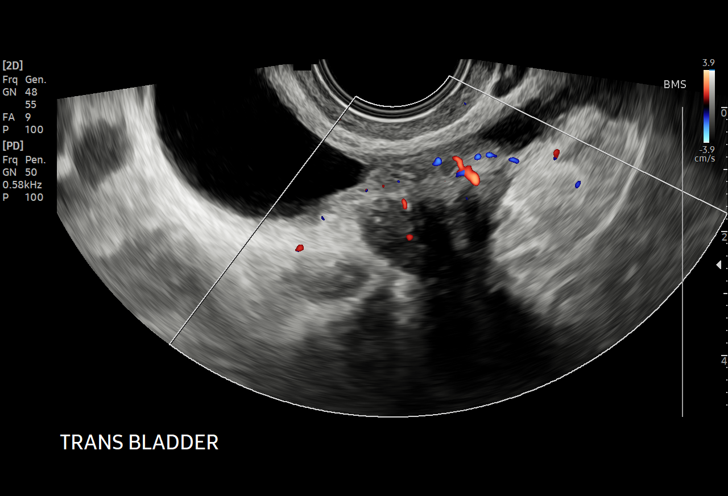
[im 57/63]
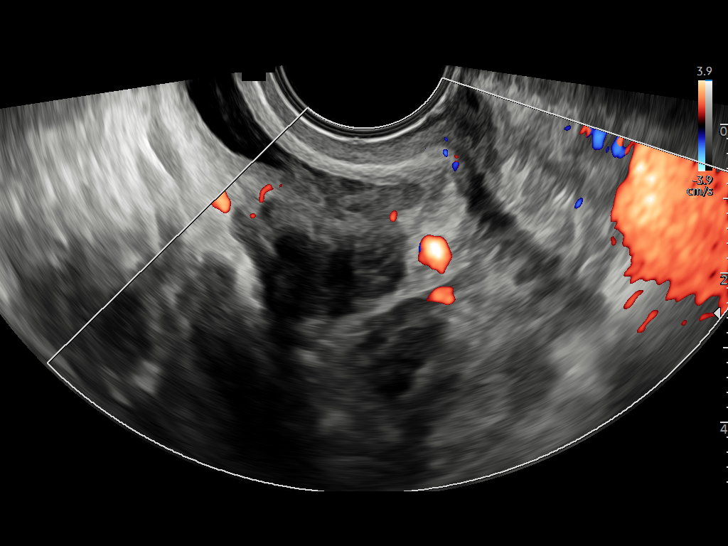
[im 63/63]
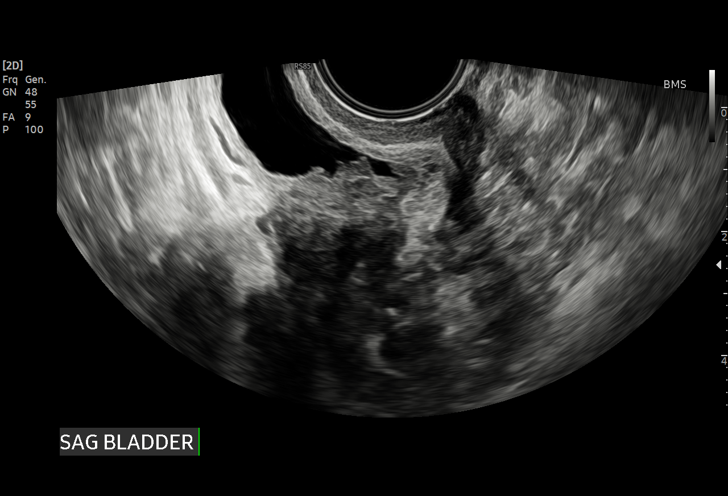

[15 of 25 positions shown; findings below may reference images not displayed]

FINDINGS: Uterus

Surgically absent

Endometrium

Surgically absent

Right ovary

Measurements: 3.3 x 1.8 x 1.6 cm = volume: 5.0 mL. Normal morphology
without mass

Left ovary

Measurements: 2.9 x 1.9 x 2.4 cm = volume: 6.9 mL. Normal morphology
without mass

Other findings

Trace free pelvic fluid. No adnexal masses. Incidentally noted mass
at the posterior aspect of the urinary bladder with polypoid
margins, 3.2 x 2.0 x 2.0 cm, epicenter is external to the bladder
wall and appearing to invade, could represent a neoplasm or
endometrioma invading the urinary bladder.
IMPRESSION: Post hysterectomy with normal appearing ovaries.

3.2 x 2.0 x 2.0 cm diameter mass appearing to invade the posterior
aspect of the urinary bladder consistent with endometrioma as
reported in history though tumors other than endometriosis could
have a similar appearance; characterization and full extent of this
lesion could be better characterized MR imaging with and without
contrast.

## 2022-04-02 ENCOUNTER — Encounter: Payer: 59 | Attending: Physical Medicine and Rehabilitation | Admitting: Registered Nurse

## 2022-04-02 ENCOUNTER — Encounter: Payer: Self-pay | Admitting: Registered Nurse

## 2022-04-02 VITALS — BP 113/78 | HR 86 | Ht 60.0 in | Wt 124.0 lb

## 2022-04-02 DIAGNOSIS — G894 Chronic pain syndrome: Secondary | ICD-10-CM | POA: Insufficient documentation

## 2022-04-02 DIAGNOSIS — Z5181 Encounter for therapeutic drug level monitoring: Secondary | ICD-10-CM | POA: Insufficient documentation

## 2022-04-02 DIAGNOSIS — R1013 Epigastric pain: Secondary | ICD-10-CM | POA: Insufficient documentation

## 2022-04-02 DIAGNOSIS — Z79891 Long term (current) use of opiate analgesic: Secondary | ICD-10-CM | POA: Insufficient documentation

## 2022-04-02 DIAGNOSIS — G8929 Other chronic pain: Secondary | ICD-10-CM | POA: Diagnosis present

## 2022-04-02 DIAGNOSIS — R102 Pelvic and perineal pain: Secondary | ICD-10-CM | POA: Diagnosis present

## 2022-04-02 MED ORDER — OXYCODONE-ACETAMINOPHEN 10-325 MG PO TABS: 1.0000 | ORAL_TABLET | Freq: Every day | ORAL | 0 refills | Status: DC | PRN

## 2022-04-02 NOTE — Progress Notes (Signed)
Subjective:    Patient ID: Christine Brooks, female    DOB: August 02, 1980, 42 y.o.   MRN: [id number]  HPI: Christine Brooks is a 42 y.o. female who returns for follow up appointment for chronic pain and medication refill. She states she's 'having  pelvic pain. She  rates her pain 4.Her current exercise regime is walking and performing stretching exercises.  [name] Morphine equivalent is 15.00 MME.   Last Oral Swab was Performed 07/05/2021, it was consistent.   Pain Inventory Average Pain 7 Pain Right Now 4 My pain is sharp, burning, and stabbing  In the last 24 hours, has pain interfered with the following? General activity 2 Relation with others 2 Enjoyment of life 2 What TIME of day is your pain at its worst? morning , daytime, evening, and night Sleep (in general) Fair  Pain is worse with: unsure and some activites Pain improves with: rest and medication Relief from Meds: 10  Family History  Problem Relation Age of Onset   Ovarian cancer Maternal Aunt    Colon cancer Cousin    Social History   Socioeconomic History   Marital status: Single    Spouse name: Not on file   Number of children: 2   Years of education: Not on file   Highest education level: Some college, no degree  Occupational History   Not on file  Tobacco Use   Smoking status: Former    Years: 2    Types: Cigarettes    Quit date: 03/17/2018    Years since quitting: 4.0    Passive exposure: Past   Smokeless tobacco: Never  Vaping Use   Vaping Use: Never used  Substance and Sexual Activity   Alcohol use: Yes    Comment: rare   Drug use: Never   Sexual activity: Not Currently    Partners: Male    Birth control/protection: Pill  Other Topics Concern   Not on file  Social History Narrative   Not on file   Social Determinants of Health   Financial Resource Strain: Not on file  Food Insecurity: Not on file  Transportation Needs: No Transportation Needs (05/19/2020)   PRAPARE - Civil engineer, contracting (Medical): No    Lack of Transportation (Non-Medical): No  Physical Activity: Not on file  Stress: Not on file  Social Connections: Not on file   Past Surgical History:  Procedure Laterality Date   BLADDER SURGERY  09/2021   Endometriosis   CESAREAN SECTION  1999;  07/ 2009   CYSTOSCOPY N/A 03/21/2020   Procedure: CYSTOSCOPY with biopsy;  Surgeon: Jaquita Folds, MD;  Location: [facility];  Service: Gynecology;  Laterality: N/A;  Total procedure time 1 hour   CYSTOSCOPY WITH URETHRAL DILATATION Right 05/2018   W/  TURBT   (right ureteral stricture)   DIAGNOSTIC LAPAROSCOPY  12/2016   DILATION AND CURETTAGE OF UTERUS  yrs ago   HEMICOLECTOMY  02/2017   fews days post op TAH for bowel obstruction   TOTAL ABDOMINAL HYSTERECTOMY  02/2017   Past Surgical History:  Procedure Laterality Date   BLADDER SURGERY  09/2021   Endometriosis   CESAREAN SECTION  1999;  07/ 2009   CYSTOSCOPY N/A 03/21/2020   Procedure: CYSTOSCOPY with biopsy;  Surgeon: Jaquita Folds, MD;  Location: [facility];  Service: Gynecology;  Laterality: N/A;  Total procedure time 1 hour   CYSTOSCOPY WITH URETHRAL DILATATION Right 05/2018  W/  TURBT   (right ureteral stricture)   DIAGNOSTIC LAPAROSCOPY  12/2016   DILATION AND CURETTAGE OF UTERUS  yrs ago   HEMICOLECTOMY  02/2017   fews days post op TAH for bowel obstruction   TOTAL ABDOMINAL HYSTERECTOMY  02/2017   Past Medical History:  Diagnosis Date   Bladder mass    Dysuria    Endometriosis    Endometriosis, bladder    Hematuria    History of 2019 novel coronavirus disease (COVID-19) 08/24/2019   positive covid test result in epic, per pt mild symptoms that resolved   History of small bowel obstruction 02/2017   2019 post op TAH  w/ surgical bowel resection   Lower urinary tract symptoms (LUTS)    Nephrolithiasis    per pt nonobstructive   BP 113/78   Pulse 86   Ht 5' (1.524 m)   Wt 124  lb (56.2 kg)   LMP 08/11/2016 (Exact Date)   SpO2 98%   BMI 24.22 kg/m   Opioid Risk Score:   Fall Risk Score:  `1  Depression screen [facility] 2/9     02/02/2022   11:00 AM 12/29/2021   10:36 AM 11/28/2021   11:06 AM 08/22/2021   10:15 AM 07/05/2021   11:21 AM 06/06/2021   11:11 AM 04/11/2021   11:09 AM  Depression screen PHQ 2/9  Decreased Interest 0 0 1 0 0 0 0  Down, Depressed, Hopeless 0 0 1 0 0 1 0  PHQ - 2 Score 0 0 2 0 0 1 0  Altered sleeping  0       Tired, decreased energy  0       Change in appetite  0       Feeling bad or failure about yourself   0       Trouble concentrating  0       Moving slowly or fidgety/restless  0       Suicidal thoughts  0       PHQ-9 Score  0          Review of Systems  Genitourinary:        Pelvic region  All other systems reviewed and are negative.      Objective:   Physical Exam Vitals and nursing note reviewed.  Constitutional:      Appearance: Normal appearance.  Cardiovascular:     Rate and Rhythm: Normal rate and regular rhythm.     Pulses: Normal pulses.     Heart sounds: Normal heart sounds.  Musculoskeletal:     Cervical back: Normal range of motion and neck supple.     Comments: Normal Muscle Bulk and Muscle Testing Reveals:  Upper Extremities: Full ROM and Muscle Strength 5/5 Lower Extremities: Full ROM and Muscle Strength 5/5 Narrow Based Gait     Skin:    General: Skin is warm and dry.  Neurological:     Mental Status: She is alert and oriented to person, place, and time.  Psychiatric:        Mood and Affect: Mood normal.        Behavior: Behavior normal.         Assessment & Plan:  Chronic abdominal pain secondary to endometriosis/ Pelvic Pain: S/P PR LAP,FULGURATE/EXCISE LESIONS  PR EXC TUMOR SOFT TISSUE ABDOMINAL WALL SUBQ <3CM  PR CYSTOURETHROSCOPY  PR PART REMV BLADDER,SIMPLE  LAPAROSCOPY, SURGICAL; W/FULGURATION OR EXCISION OF LESIONS OVARY,  PELVIC VISCERA, PERITONEAL SURFACE  EXCISION, TUMOR,  SOFT TISSUE OF ABDOMINAL WALL, SUBCUTANEOUS; LESS  THAN 3 CM S/P on 09/28/2021.Dr Kathyrn Drown.  Continue  to Monitor.  2.  Chronic abdominal pain secondary to endometriosis/ Pelvic Pain: S/PSurgery on 09/28/2021, GYN following. Continue current medication regimen. Refilled : Oxycodone 10mg /325 mg one tablet daily as needed for pain. #30. We will continue the opioid monitoring program, this consists of regular clinic visits, examinations, urine drug screen, pill counts as well as use of [place] Controlled Substance Reporting system. A 12 month History has been reviewed on the [place] Controlled Substance Reporting System on 03 /18/2024 3. Chronic Pain Syndrome: Continue current medication . Continue HEP as Tolerated . Continue to Monitor. 04/02/2022   F/U in 2 months

## 2022-04-09 ENCOUNTER — Telehealth: Payer: Self-pay | Admitting: *Deleted

## 2022-04-09 NOTE — Telephone Encounter (Signed)
Christine Brooks ([name]) [id number] oxyCODONE-Acetaminophen 10-325MG  tablets Status: PA Response - Approved

## 2022-05-28 ENCOUNTER — Encounter: Payer: 59 | Attending: Physical Medicine and Rehabilitation | Admitting: Registered Nurse

## 2022-05-28 ENCOUNTER — Encounter: Payer: Self-pay | Admitting: Registered Nurse

## 2022-05-28 VITALS — BP 107/74 | HR 85 | Ht 60.0 in | Wt 123.0 lb

## 2022-05-28 DIAGNOSIS — G894 Chronic pain syndrome: Secondary | ICD-10-CM | POA: Diagnosis not present

## 2022-05-28 DIAGNOSIS — R1013 Epigastric pain: Secondary | ICD-10-CM | POA: Insufficient documentation

## 2022-05-28 DIAGNOSIS — Z79891 Long term (current) use of opiate analgesic: Secondary | ICD-10-CM

## 2022-05-28 DIAGNOSIS — Z5181 Encounter for therapeutic drug level monitoring: Secondary | ICD-10-CM | POA: Insufficient documentation

## 2022-05-28 DIAGNOSIS — G8929 Other chronic pain: Secondary | ICD-10-CM | POA: Diagnosis present

## 2022-05-28 DIAGNOSIS — R102 Pelvic and perineal pain: Secondary | ICD-10-CM | POA: Diagnosis present

## 2022-05-28 MED ORDER — OXYCODONE-ACETAMINOPHEN 10-325 MG PO TABS: 1.0000 | ORAL_TABLET | Freq: Every day | ORAL | 0 refills | Status: DC | PRN

## 2022-05-28 NOTE — Progress Notes (Signed)
Subjective:    Patient ID: Christine Brooks, female    DOB: 1980-02-29, 42 y.o.   MRN: [id number]  HPI: Christine Brooks is a 42 y.o. female who returns for follow up appointment for chronic pain and medication refill. She states her pain is located in her abdomen and pelvic pressure. She rates her pain 0 on her Health and History form. Her current exercise regime is walking and performing stretching exercises.  Ms. Pollins Morphine equivalent is 15.00 MME.   UDS ordered today.    Pain Inventory Average Pain 8 Pain Right Now 0 My pain is intermittent, burning, and stabbing  In the last 24 hours, has pain interfered with the following? General activity 0 Relation with others 0 Enjoyment of life 0 What TIME of day is your pain at its worst? morning , daytime, evening, and night Sleep (in general) Fair  Pain is worse with: walking, bending, sitting, inactivity, standing, unsure, and some activites Pain improves with: rest and medication Relief from Meds: 10  Family History  Problem Relation Age of Onset   Ovarian cancer Maternal Aunt    Colon cancer Cousin    Social History   Socioeconomic History   Marital status: Single    Spouse name: Not on file   Number of children: 2   Years of education: Not on file   Highest education level: Some college, no degree  Occupational History   Not on file  Tobacco Use   Smoking status: Former    Years: 2    Types: Cigarettes    Quit date: 03/17/2018    Years since quitting: 4.2    Passive exposure: Past   Smokeless tobacco: Never  Vaping Use   Vaping Use: Never used  Substance and Sexual Activity   Alcohol use: Yes    Comment: rare   Drug use: Never   Sexual activity: Not Currently    Partners: Male    Birth control/protection: Pill  Other Topics Concern   Not on file  Social History Narrative   Not on file   Social Determinants of Health   Financial Resource Strain: Not on file  Food Insecurity: Not on file  Transportation  Needs: No Transportation Needs (05/19/2020)   PRAPARE - Administrator, Civil Service (Medical): No    Lack of Transportation (Non-Medical): No  Physical Activity: Not on file  Stress: Not on file  Social Connections: Not on file   Past Surgical History:  Procedure Laterality Date   BLADDER SURGERY  09/2021   Endometriosis   CESAREAN SECTION  1999;  07/ 2009   CYSTOSCOPY N/A 03/21/2020   Procedure: CYSTOSCOPY with biopsy;  Surgeon: Marguerita Beards, MD;  Location: [facility];  Service: Gynecology;  Laterality: N/A;  Total procedure time 1 hour   CYSTOSCOPY WITH URETHRAL DILATATION Right 05/2018   W/  TURBT   (right ureteral stricture)   DIAGNOSTIC LAPAROSCOPY  12/2016   DILATION AND CURETTAGE OF UTERUS  yrs ago   HEMICOLECTOMY  02/2017   fews days post op TAH for bowel obstruction   TOTAL ABDOMINAL HYSTERECTOMY  02/2017   Past Surgical History:  Procedure Laterality Date   BLADDER SURGERY  09/2021   Endometriosis   CESAREAN SECTION  1999;  07/ 2009   CYSTOSCOPY N/A 03/21/2020   Procedure: CYSTOSCOPY with biopsy;  Surgeon: Marguerita Beards, MD;  Location: [facility];  Service: Gynecology;  Laterality: N/A;  Total procedure time  1 hour   CYSTOSCOPY WITH URETHRAL DILATATION Right 05/2018   W/  TURBT   (right ureteral stricture)   DIAGNOSTIC LAPAROSCOPY  12/2016   DILATION AND CURETTAGE OF UTERUS  yrs ago   HEMICOLECTOMY  02/2017   fews days post op TAH for bowel obstruction   TOTAL ABDOMINAL HYSTERECTOMY  02/2017   Past Medical History:  Diagnosis Date   Bladder mass    Dysuria    Endometriosis    Endometriosis, bladder    Hematuria    History of 2019 novel coronavirus disease (COVID-19) 08/24/2019   positive covid test result in epic, per pt mild symptoms that resolved   History of small bowel obstruction 02/2017   2019 post op TAH  w/ surgical bowel resection   Lower urinary tract symptoms (LUTS)    Nephrolithiasis     per pt nonobstructive   Ht 5' (1.524 m)   Wt 123 lb (55.8 kg)   LMP 08/11/2016 (Exact Date)   BMI 24.02 kg/m   Opioid Risk Score:   Fall Risk Score:  `1  Depression screen [facility] 2/9     04/02/2022    2:54 PM 02/02/2022   11:00 AM 12/29/2021   10:36 AM 11/28/2021   11:06 AM 08/22/2021   10:15 AM 07/05/2021   11:21 AM 06/06/2021   11:11 AM  Depression screen PHQ 2/9  Decreased Interest 0 0 0 1 0 0 0  Down, Depressed, Hopeless 0 0 0 1 0 0 1  PHQ - 2 Score 0 0 0 2 0 0 1  Altered sleeping   0      Tired, decreased energy   0      Change in appetite   0      Feeling bad or failure about yourself    0      Trouble concentrating   0      Moving slowly or fidgety/restless   0      Suicidal thoughts   0      PHQ-9 Score   0        Review of Systems  Musculoskeletal:        Left & Right Groin pain  All other systems reviewed and are negative.      Objective:   Physical Exam Vitals and nursing note reviewed.  Constitutional:      Appearance: Normal appearance.  Cardiovascular:     Rate and Rhythm: Normal rate and regular rhythm.     Pulses: Normal pulses.     Heart sounds: Normal heart sounds.  Pulmonary:     Effort: Pulmonary effort is normal.     Breath sounds: Normal breath sounds.  Musculoskeletal:     Cervical back: Normal range of motion and neck supple.     Comments: Normal Muscle Bulk and Muscle Testing Reveals:  Upper Extremities: Full ROM and Muscle Strength 5/5 Lower Extremities: Full ROM and Muscle Strength 5/5 Arises from Table with ease Narrow Based  Gait     Skin:    General: Skin is warm and dry.  Neurological:     Mental Status: She is alert and oriented to person, place, and time.  Psychiatric:        Mood and Affect: Mood normal.        Behavior: Behavior normal.         Assessment & Plan:  Chronic abdominal pain secondary to endometriosis/ Pelvic Pain: S/P PR LAP,FULGURATE/EXCISE LESIONS  PR EXC TUMOR SOFT TISSUE ABDOMINAL  WALL SUBQ <3CM   PR CYSTOURETHROSCOPY  PR PART REMV BLADDER,SIMPLE  LAPAROSCOPY, SURGICAL; W/FULGURATION OR EXCISION OF LESIONS OVARY,  PELVIC VISCERA, PERITONEAL SURFACE  EXCISION, TUMOR, SOFT TISSUE OF ABDOMINAL WALL, SUBCUTANEOUS; LESS  THAN 3 CM S/P on 09/28/2021.Dr Elpidio Galea.  Continue  to Monitor. 05/28/2022 2.  Chronic abdominal pain secondary to endometriosis/ Pelvic Pain: S/PSurgery on 09/28/2021, GYN following. Continue current medication regimen. Refilled : Oxycodone 10 mg/325 mg one tablet daily as needed for pain. #30. We will continue the opioid monitoring program, this consists of regular clinic visits, examinations, urine drug screen, pill counts as well as use of [place] Controlled Substance Reporting system. A 12 month History has been reviewed on the [place] Controlled Substance Reporting System on 05/28/2022 3. Chronic Pain Syndrome: Continue current medication . Continue HEP as Tolerated . Continue to Monitor. 05/28/2022   F/U in 2 months

## 2022-05-29 ENCOUNTER — Telehealth: Payer: Self-pay | Admitting: Registered Nurse

## 2022-05-29 MED ORDER — OXYCODONE-ACETAMINOPHEN 10-325 MG PO TABS: 1.0000 | ORAL_TABLET | Freq: Every day | ORAL | 0 refills | Status: DC | PRN

## 2022-05-29 NOTE — Telephone Encounter (Signed)
[facility]  called and Oxycodone prescription canceled.  Oxycodone e-scribed to [facility] as per patient request.  Call placed to Ms. Arif, no answer, left message to return the call.

## 2022-05-30 ENCOUNTER — Encounter: Payer: Self-pay | Admitting: Obstetrics

## 2022-05-30 ENCOUNTER — Other Ambulatory Visit (HOSPITAL_COMMUNITY)
Admission: RE | Admit: 2022-05-30 | Discharge: 2022-05-30 | Disposition: A | Discharge status: Home / Self Care | Payer: 59 | Source: Ambulatory Visit | Attending: Obstetrics | Admitting: Obstetrics

## 2022-05-30 ENCOUNTER — Ambulatory Visit (INDEPENDENT_AMBULATORY_CARE_PROVIDER_SITE_OTHER): Payer: 59 | Admitting: Obstetrics

## 2022-05-30 VITALS — BP 118/77 | HR 80 | Ht 60.0 in | Wt 124.7 lb

## 2022-05-30 DIAGNOSIS — Z0000 Encounter for general adult medical examination without abnormal findings: Secondary | ICD-10-CM

## 2022-05-30 DIAGNOSIS — N898 Other specified noninflammatory disorders of vagina: Secondary | ICD-10-CM

## 2022-05-30 DIAGNOSIS — D508 Other iron deficiency anemias: Secondary | ICD-10-CM

## 2022-05-30 DIAGNOSIS — Z113 Encounter for screening for infections with a predominantly sexual mode of transmission: Secondary | ICD-10-CM

## 2022-05-30 DIAGNOSIS — Z131 Encounter for screening for diabetes mellitus: Secondary | ICD-10-CM | POA: Diagnosis not present

## 2022-05-30 MED ORDER — TERCONAZOLE 0.8 % VA CREA
1.0000 | TOPICAL_CREAM | Freq: Every day | VAGINAL | 0 refills | Status: AC
Start: 2022-05-30 — End: ?

## 2022-05-30 NOTE — Progress Notes (Signed)
Pt is in the office for GYN visit. Hx of Partial hysterectomy Pt reports vaginal itching for a few days

## 2022-05-30 NOTE — Progress Notes (Addendum)
Patient ID: Christine Brooks, female   DOB: Oct 21, 1980, 42 y.o.   MRN: [id number]  Chief Complaint  Patient presents with   GYN    HPI Christine Brooks is a 42 y.o. female.  Complains of vaginal discharge and itching. HPI  Past Medical History:  Diagnosis Date   Bladder mass    Dysuria    Endometriosis    Endometriosis, bladder    Hematuria    History of 2019 novel coronavirus disease (COVID-19) 08/24/2019   positive covid test result in epic, per pt mild symptoms that resolved   History of small bowel obstruction 02/2017   2019 post op TAH  w/ surgical bowel resection   Lower urinary tract symptoms (LUTS)    Nephrolithiasis    per pt nonobstructive    Past Surgical History:  Procedure Laterality Date   BLADDER SURGERY  09/2021   Endometriosis   CESAREAN SECTION  1999;  07/ 2009   CYSTOSCOPY N/A 03/21/2020   Procedure: CYSTOSCOPY with biopsy;  Surgeon: Marguerita Beards, MD;  Location: [facility];  Service: Gynecology;  Laterality: N/A;  Total procedure time 1 hour   CYSTOSCOPY WITH URETHRAL DILATATION Right 05/2018   W/  TURBT   (right ureteral stricture)   DIAGNOSTIC LAPAROSCOPY  12/2016   DILATION AND CURETTAGE OF UTERUS  yrs ago   HEMICOLECTOMY  02/2017   fews days post op TAH for bowel obstruction   TOTAL ABDOMINAL HYSTERECTOMY  02/2017    Family History  Problem Relation Age of Onset   Ovarian cancer Maternal Aunt    Colon cancer Cousin     Social History Social History   Tobacco Use   Smoking status: Former    Years: 2    Types: Cigarettes    Quit date: 03/17/2018    Years since quitting: 4.2    Passive exposure: Past   Smokeless tobacco: Never  Vaping Use   Vaping Use: Never used  Substance Use Topics   Alcohol use: Yes    Comment: rare   Drug use: Never    No Known Allergies  Current Outpatient Medications  Medication Sig Dispense Refill   Cholecalciferol (VITAMIN D) 50 MCG (2000 UT) CAPS Take 1 capsule (2,000 Units total) by  mouth daily before breakfast. 30 capsule 11   Fluocinolone Acetonide Scalp 0.01 % OIL Apply topically at bedtime.     ibuprofen (ADVIL) 800 MG tablet Take 1 tablet (800 mg total) by mouth every 8 (eight) hours as needed. 30 tablet 5   oxyCODONE-acetaminophen (PERCOCET) 10-325 MG tablet Take 1 tablet by mouth daily as needed for pain. 30 tablet 0   terconazole (TERAZOL 3) 0.8 % vaginal cream Place 1 applicator vaginally at bedtime. 20 g 0   acetaminophen (TYLENOL) 325 MG tablet Take by mouth. (Patient not taking: Reported on 05/28/2022)     norethindrone (AYGESTIN) 5 MG tablet Take 1 tablet (5 mg total) by mouth daily. (Patient not taking: Reported on 05/28/2022) 30 tablet 11   Vibegron (GEMTESA) 75 MG TABS Take by mouth. (Patient not taking: Reported on 05/28/2022)     Current Facility-Administered Medications  Medication Dose Route Frequency Provider Last Rate Last Admin   sodium bicarbonate 4.2 % injection 2.5 mEq  2.5 mEq Intravenous Once Marguerita Beards, MD        Review of Systems Review of Systems Constitutional: negative for fatigue and weight loss Respiratory: negative for cough and wheezing Cardiovascular: negative for chest pain, fatigue and palpitations  Gastrointestinal: negative for abdominal pain and change in bowel habits Genitourinary: positive for vaginal discharge and itching Integument/breast: negative for nipple discharge Musculoskeletal:negative for myalgias Neurological: negative for gait problems and tremors Behavioral/Psych: negative for abusive relationship, depression Endocrine: negative for temperature intolerance      Blood pressure 118/77, pulse 80, height 5' (1.524 m), weight 124 lb 11.2 oz (56.6 kg), last menstrual period 08/11/2016.  Physical Exam Physical Exam General:   Alert and no distress  Skin:   no rash or abnormalities  Lungs:   clear to auscultation bilaterally  Heart:   regular rate and rhythm, S1, S2 normal, no murmur, click, rub or  gallop  Breasts:   normal without suspicious masses, skin or nipple changes or axillary nodes  Abdomen:  normal findings: no organomegaly, soft, non-tender and no hernia  Pelvis:  External genitalia: normal general appearance Urinary system: urethral meatus normal and bladder without fullness, nontender Vaginal: normal without tenderness, induration or masses Cervix: absent Adnexa: normal bimanual exam Uterus: absent    1. Vaginal discharge Rx: - Cervicovaginal ancillary only( [place]) - terconazole (TERAZOL 3) 0.8 % vaginal cream; Place 1 applicator vaginally at bedtime.  Dispense: 20 g; Refill: 0  2. Screen for STD (sexually transmitted disease) Rx: - HIV antibody (with reflex) - RPR - Hepatitis B Surface AntiGEN - Hepatitis C Antibody  3. Screening for diabetes mellitus Rx: - Hemoglobin A1c  4. Iron deficiency anemia secondary to inadequate dietary iron intake Rx: - CBC - Ferritin  5. Routine adult health maintenance Rx: - Ambulatory referral to Internal Medicine   Data Reviewed Wet Prep  Assessment     1. Vaginal discharge Rx: - Cervicovaginal ancillary only( [place]) - terconazole (TERAZOL 3) 0.8 % vaginal cream; Place 1 applicator vaginally at bedtime.  Dispense: 20 g; Refill: 0  2. Screen for STD (sexually transmitted disease) Rx: - HIV antibody (with reflex) - RPR - Hepatitis B Surface AntiGEN - Hepatitis C Antibody  3. Screening for diabetes mellitus Rx: - Hemoglobin A1c  4. Iron deficiency anemia secondary to inadequate dietary iron intake Rx: - CBC - Ferritin  5. Routine adult health maintenance Rx: - Ambulatory referral to Internal Medicine     Plan   Follow up prn  Orders Placed This Encounter  Procedures   HIV antibody (with reflex)   RPR   Hepatitis B Surface AntiGEN   Hepatitis C Antibody   Hemoglobin A1c   CBC   Ferritin   Ambulatory referral to Internal Medicine    Referral Priority:   Routine    Referral Type:    Consultation    Referral Reason:   Specialty Services Required    Requested Specialty:   Internal Medicine    Number of Visits Requested:   1   Meds ordered this encounter  Medications   terconazole (TERAZOL 3) 0.8 % vaginal cream    Sig: Place 1 applicator vaginally at bedtime.    Dispense:  20 g    Refill:  0    Brock Bad, MD 05/30/2022 4:55 PM

## 2022-05-31 LAB — CBC
Hematocrit: 38.5 % (ref 34.0–46.6)
Hemoglobin: 12.7 g/dL (ref 11.1–15.9)
MCH: 27.9 pg (ref 26.6–33.0)
MCHC: 33 g/dL (ref 31.5–35.7)
MCV: 85 fL (ref 79–97)
Platelets: 202 10*3/uL (ref 150–450)
RBC: 4.55 x10E6/uL (ref 3.77–5.28)
RDW: 13.7 % (ref 11.7–15.4)
WBC: 6.6 10*3/uL (ref 3.4–10.8)

## 2022-05-31 LAB — FERRITIN: Ferritin: 169 ng/mL — ABNORMAL HIGH (ref 15–150)

## 2022-05-31 LAB — RPR: RPR Ser Ql: NONREACTIVE

## 2022-05-31 LAB — HEPATITIS C ANTIBODY: Hep C Virus Ab: NONREACTIVE

## 2022-05-31 LAB — TOXASSURE SELECT,+ANTIDEPR,UR

## 2022-05-31 LAB — HEMOGLOBIN A1C
Est. average glucose Bld gHb Est-mCnc: 103 mg/dL
Hgb A1c MFr Bld: 5.2 % (ref 4.8–5.6)

## 2022-05-31 LAB — HIV ANTIBODY (ROUTINE TESTING W REFLEX): HIV Screen 4th Generation wRfx: NONREACTIVE

## 2022-05-31 LAB — HEPATITIS B SURFACE ANTIGEN: Hepatitis B Surface Ag: NEGATIVE

## 2022-06-01 LAB — CERVICOVAGINAL ANCILLARY ONLY
Bacterial Vaginitis (gardnerella): NEGATIVE
Candida Glabrata: NEGATIVE
Candida Vaginitis: NEGATIVE
Chlamydia: NEGATIVE
Comment: NEGATIVE
Comment: NEGATIVE
Comment: NEGATIVE
Comment: NEGATIVE
Comment: NEGATIVE
Comment: NORMAL
Neisseria Gonorrhea: NEGATIVE
Trichomonas: POSITIVE — AB

## 2022-06-02 ENCOUNTER — Other Ambulatory Visit: Payer: Self-pay | Admitting: Obstetrics

## 2022-06-02 DIAGNOSIS — B9689 Other specified bacterial agents as the cause of diseases classified elsewhere: Secondary | ICD-10-CM

## 2022-06-02 MED ORDER — METRONIDAZOLE 500 MG PO TABS
500.0000 mg | ORAL_TABLET | Freq: Two times a day (BID) | ORAL | 2 refills | Status: DC
Start: 2022-06-02 — End: 2022-06-14

## 2022-06-05 ENCOUNTER — Other Ambulatory Visit: Payer: Self-pay | Admitting: Obstetrics and Gynecology

## 2022-06-14 ENCOUNTER — Other Ambulatory Visit: Payer: Self-pay

## 2022-06-14 DIAGNOSIS — N760 Acute vaginitis: Secondary | ICD-10-CM

## 2022-06-14 DIAGNOSIS — B9689 Other specified bacterial agents as the cause of diseases classified elsewhere: Secondary | ICD-10-CM

## 2022-06-14 MED ORDER — METRONIDAZOLE 500 MG PO TABS
500.0000 mg | ORAL_TABLET | Freq: Two times a day (BID) | ORAL | 2 refills | Status: DC
Start: 2022-06-14 — End: 2022-07-23

## 2022-06-21 ENCOUNTER — Telehealth: Payer: Self-pay | Admitting: *Deleted

## 2022-06-21 NOTE — Telephone Encounter (Signed)
Urine drug screen for this encounter is consistent for prescribed medication 

## 2022-07-12 ENCOUNTER — Telehealth: Payer: Self-pay | Admitting: Registered Nurse

## 2022-07-12 MED ORDER — OXYCODONE-ACETAMINOPHEN 10-325 MG PO TABS: 1.0000 | ORAL_TABLET | Freq: Every day | ORAL | 0 refills | Status: DC | PRN

## 2022-07-12 NOTE — Telephone Encounter (Signed)
PMP was Reviewed.  Oxycodone e-scribed to pharmacy.  Christine Brooks is aware via My-Chart message

## 2022-07-23 ENCOUNTER — Encounter: Payer: Self-pay | Admitting: Registered Nurse

## 2022-07-23 ENCOUNTER — Encounter: Payer: 59 | Attending: Physical Medicine and Rehabilitation | Admitting: Registered Nurse

## 2022-07-23 VITALS — BP 106/74 | HR 73 | Ht 60.0 in | Wt 123.0 lb

## 2022-07-23 DIAGNOSIS — R1013 Epigastric pain: Secondary | ICD-10-CM | POA: Insufficient documentation

## 2022-07-23 DIAGNOSIS — G894 Chronic pain syndrome: Secondary | ICD-10-CM | POA: Diagnosis present

## 2022-07-23 DIAGNOSIS — G8929 Other chronic pain: Secondary | ICD-10-CM | POA: Diagnosis present

## 2022-07-23 DIAGNOSIS — M2550 Pain in unspecified joint: Secondary | ICD-10-CM | POA: Insufficient documentation

## 2022-07-23 DIAGNOSIS — Z79891 Long term (current) use of opiate analgesic: Secondary | ICD-10-CM | POA: Insufficient documentation

## 2022-07-23 DIAGNOSIS — Z5181 Encounter for therapeutic drug level monitoring: Secondary | ICD-10-CM | POA: Insufficient documentation

## 2022-07-23 MED ORDER — OXYCODONE-ACETAMINOPHEN 10-325 MG PO TABS: 1.0000 | ORAL_TABLET | Freq: Every day | ORAL | 0 refills | Status: DC | PRN

## 2022-07-23 NOTE — Progress Notes (Signed)
Subjective:    Patient ID: Christine Brooks, female    DOB: 09/24/1980, 42 y.o.   MRN: [id number]  HPI: Christine Brooks is a 42 y.o. female who returns for follow up appointment for chronic pain and medication refill. She reports generalized pain. She rates her pain 0. Her current exercise regime is walking and performing stretching exercises.  Ms. Molitor Morphine equivalent is 15.00 MME.   Last UDS was Performed on 05/28/2022, it was consistent.      Pain Inventory Average Pain 10 Pain Right Now 0 My pain is constant, sharp, and stabbing  In the last 24 hours, has pain interfered with the following? General activity 0 Relation with others 0 Enjoyment of life 0 What TIME of day is your pain at its worst? morning , daytime, evening, and night Sleep (in general) Fair  Pain is worse with: walking and sitting, lifting pushing & pulling Pain improves with: rest and medication Relief from Meds: 10  Family History  Problem Relation Age of Onset   Ovarian cancer Maternal Aunt    Colon cancer Cousin    Social History   Socioeconomic History   Marital status: Single    Spouse name: Not on file   Number of children: 2   Years of education: Not on file   Highest education level: Some college, no degree  Occupational History   Not on file  Tobacco Use   Smoking status: Former    Years: 2    Types: Cigarettes    Quit date: 03/17/2018    Years since quitting: 4.3    Passive exposure: Past   Smokeless tobacco: Never  Vaping Use   Vaping Use: Never used  Substance and Sexual Activity   Alcohol use: Yes    Comment: rare   Drug use: Never   Sexual activity: Not Currently    Partners: Male    Birth control/protection: Pill  Other Topics Concern   Not on file  Social History Narrative   Not on file   Social Determinants of Health   Financial Resource Strain: Not on file  Food Insecurity: Not on file  Transportation Needs: No Transportation Needs (05/19/2020)   PRAPARE -  Administrator, Civil Service (Medical): No    Lack of Transportation (Non-Medical): No  Physical Activity: Not on file  Stress: Not on file  Social Connections: Not on file   Past Surgical History:  Procedure Laterality Date   BLADDER SURGERY  09/2021   Endometriosis   CESAREAN SECTION  1999;  07/ 2009   CYSTOSCOPY N/A 03/21/2020   Procedure: CYSTOSCOPY with biopsy;  Surgeon: Marguerita Beards, MD;  Location: [facility];  Service: Gynecology;  Laterality: N/A;  Total procedure time 1 hour   CYSTOSCOPY WITH URETHRAL DILATATION Right 05/2018   W/  TURBT   (right ureteral stricture)   DIAGNOSTIC LAPAROSCOPY  12/2016   DILATION AND CURETTAGE OF UTERUS  yrs ago   HEMICOLECTOMY  02/2017   fews days post op TAH for bowel obstruction   TOTAL ABDOMINAL HYSTERECTOMY  02/2017   Past Surgical History:  Procedure Laterality Date   BLADDER SURGERY  09/2021   Endometriosis   CESAREAN SECTION  1999;  07/ 2009   CYSTOSCOPY N/A 03/21/2020   Procedure: CYSTOSCOPY with biopsy;  Surgeon: Marguerita Beards, MD;  Location: [facility] [place];  Service: Gynecology;  Laterality: N/A;  Total procedure time 1 hour   CYSTOSCOPY WITH URETHRAL DILATATION  Right 05/2018   W/  TURBT   (right ureteral stricture)   DIAGNOSTIC LAPAROSCOPY  12/2016   DILATION AND CURETTAGE OF UTERUS  yrs ago   HEMICOLECTOMY  02/2017   fews days post op TAH for bowel obstruction   TOTAL ABDOMINAL HYSTERECTOMY  02/2017   Past Medical History:  Diagnosis Date   Bladder mass    Dysuria    Endometriosis    Endometriosis, bladder    Hematuria    History of 2019 novel coronavirus disease (COVID-19) 08/24/2019   positive covid test result in epic, per pt mild symptoms that resolved   History of small bowel obstruction 02/2017   2019 post op TAH  w/ surgical bowel resection   Lower urinary tract symptoms (LUTS)    Nephrolithiasis    per pt nonobstructive   BP 106/74   Pulse 73   Ht  5' (1.524 m)   Wt 123 lb (55.8 kg)   LMP 08/11/2016 (Exact Date)   SpO2 98%   BMI 24.02 kg/m   Opioid Risk Score:   Fall Risk Score:  `1  Depression screen [facility] 2/9     07/23/2022    3:43 PM 05/28/2022    3:01 PM 04/02/2022    2:54 PM 02/02/2022   11:00 AM 12/29/2021   10:36 AM 11/28/2021   11:06 AM 08/22/2021   10:15 AM  Depression screen PHQ 2/9  Decreased Interest 0 0 0 0 0 1 0  Down, Depressed, Hopeless 0 0 0 0 0 1 0  PHQ - 2 Score 0 0 0 0 0 2 0  Altered sleeping     0    Tired, decreased energy     0    Change in appetite     0    Feeling bad or failure about yourself      0    Trouble concentrating     0    Moving slowly or fidgety/restless     0    Suicidal thoughts     0    PHQ-9 Score     0      Review of Systems  Musculoskeletal:        Pain in both sides of the back, both shoulder, right groin  All other systems reviewed and are negative.     Objective:   Physical Exam Vitals and nursing note reviewed.  Constitutional:      Appearance: Normal appearance.  Cardiovascular:     Rate and Rhythm: Normal rate and regular rhythm.  Pulmonary:     Effort: Pulmonary effort is normal.     Breath sounds: Normal breath sounds.  Musculoskeletal:     Cervical back: Normal range of motion and neck supple.     Comments: Normal Muscle Bulk and Muscle Testing Reveals:  Upper Extremities: Full ROM and Muscle Strength 5/5 Bilateral AC Joint Tenderness Thoracic and Lumbar Hypersensitivity Lower Extremities: Full ROM and  Muscle Strength 5/5 Arises from Table with ease Narrow Based Gait     Skin:    General: Skin is warm and dry.  Neurological:     Mental Status: She is alert and oriented to person, place, and time.  Psychiatric:        Mood and Affect: Mood normal.        Behavior: Behavior normal.         Assessment & Plan:  Chronic abdominal pain secondary to endometriosis/ Pelvic Pain: S/P PR LAP,FULGURATE/EXCISE LESIONS  PR EXC TUMOR  SOFT TISSUE ABDOMINAL  WALL SUBQ <3CM  PR CYSTOURETHROSCOPY  PR PART REMV BLADDER,SIMPLE  LAPAROSCOPY, SURGICAL; W/FULGURATION OR EXCISION OF LESIONS OVARY,  PELVIC VISCERA, PERITONEAL SURFACE  EXCISION, TUMOR, SOFT TISSUE OF ABDOMINAL WALL, SUBCUTANEOUS; LESS  THAN 3 CM S/P on 09/28/2021.Dr Elpidio Galea.  Continue  to Monitor. 07/23/2022 2.  Chronic abdominal pain secondary to endometriosis/ Pelvic Pain: S/PSurgery on 09/28/2021, GYN following. Continue current medication regimen. Refilled : Oxycodone 10 mg/325 mg one tablet daily as needed for pain. #30. We will continue the opioid monitoring program, this consists of regular clinic visits, examinations, urine drug screen, pill counts as well as use of [place] Controlled Substance Reporting system. A 12 month History has been reviewed on the [place] Controlled Substance Reporting System on 07/23/2022 3. Chronic Pain Syndrome: Continue current medication . Continue HEP as Tolerated . Continue to Monitor. 07/23/2022   F/U in 2 months

## 2022-08-03 ENCOUNTER — Other Ambulatory Visit: Payer: Self-pay | Admitting: Obstetrics

## 2022-08-03 DIAGNOSIS — Z1231 Encounter for screening mammogram for malignant neoplasm of breast: Secondary | ICD-10-CM

## 2022-08-16 ENCOUNTER — Other Ambulatory Visit (HOSPITAL_COMMUNITY): Admission: RE | Admit: 2022-08-16 | Payer: 59 | Source: Ambulatory Visit | Admitting: Obstetrics and Gynecology

## 2022-08-16 ENCOUNTER — Ambulatory Visit (INDEPENDENT_AMBULATORY_CARE_PROVIDER_SITE_OTHER): Payer: 59

## 2022-08-16 DIAGNOSIS — N898 Other specified noninflammatory disorders of vagina: Secondary | ICD-10-CM | POA: Insufficient documentation

## 2022-08-16 NOTE — Progress Notes (Signed)
..  SUBJECTIVE:  42 y.o. female complains of  vaginal discharge for a few day(s). Denies abnormal vaginal bleeding or significant pelvic pain or fever. No UTI symptoms. Denies history of known exposure to STD.  Patient's last menstrual period was 08/11/2016 (exact date).  OBJECTIVE:  She appears well, afebrile. Urine dipstick: not done.  ASSESSMENT:  Vaginal Discharge   PLAN:  GC, chlamydia, trichomonas, BVAG, CVAG probe sent to lab. Treatment: To be determined once lab results are received ROV prn if symptoms persist or worsen.

## 2022-09-10 ENCOUNTER — Other Ambulatory Visit: Payer: Self-pay | Admitting: Obstetrics and Gynecology

## 2022-09-10 ENCOUNTER — Telehealth: Payer: Self-pay | Admitting: General Practice

## 2022-09-10 ENCOUNTER — Encounter: Payer: Self-pay | Admitting: General Practice

## 2022-09-10 DIAGNOSIS — N6320 Unspecified lump in the left breast, unspecified quadrant: Secondary | ICD-10-CM

## 2022-09-14 ENCOUNTER — Ambulatory Visit: Payer: 59

## 2022-10-01 ENCOUNTER — Encounter: Payer: Self-pay | Admitting: Registered Nurse

## 2022-10-01 ENCOUNTER — Encounter: Payer: 59 | Attending: Physical Medicine and Rehabilitation | Admitting: Registered Nurse

## 2022-10-01 VITALS — BP 115/78 | HR 94 | Ht 60.0 in | Wt 125.0 lb

## 2022-10-01 DIAGNOSIS — R102 Pelvic and perineal pain unspecified side: Secondary | ICD-10-CM

## 2022-10-01 DIAGNOSIS — Z79891 Long term (current) use of opiate analgesic: Secondary | ICD-10-CM

## 2022-10-01 DIAGNOSIS — G894 Chronic pain syndrome: Secondary | ICD-10-CM | POA: Diagnosis not present

## 2022-10-01 DIAGNOSIS — R1013 Epigastric pain: Secondary | ICD-10-CM | POA: Insufficient documentation

## 2022-10-01 DIAGNOSIS — Z5181 Encounter for therapeutic drug level monitoring: Secondary | ICD-10-CM | POA: Diagnosis present

## 2022-10-01 DIAGNOSIS — G8929 Other chronic pain: Secondary | ICD-10-CM | POA: Diagnosis present

## 2022-10-01 MED ORDER — OXYCODONE-ACETAMINOPHEN 10-325 MG PO TABS: 1.0000 | ORAL_TABLET | Freq: Every day | ORAL | 0 refills | Status: DC | PRN

## 2022-10-01 NOTE — Progress Notes (Signed)
Subjective:    Patient ID: Christine Brooks, female    DOB: 18-Feb-1980, 42 y.o.   MRN: [id number]  HPI: Christine Brooks is a 42 y.o. female who returns for follow up appointment for chronic pain and medication refill. She states at this time she is pain free earlier today she had abdominal pain and pelvic pressure. She.rates her pain 0. Her current exercise regime is walking and performing stretching exercises.  Ms. Odonohue Morphine equivalent is 15.00 MME.   UDS ordered today.    Pain Inventory Average Pain 8 Pain Right Now 0 My pain is intermittent, sharp, and stabbing  In the last 24 hours, has pain interfered with the following? General activity 0 Relation with others 0 Enjoyment of life 0 What TIME of day is your pain at its worst? varies Sleep (in general) Good  Pain is worse with:  lifting Pain improves with: rest and medication Relief from Meds: 10  Family History  Problem Relation Age of Onset   Ovarian cancer Maternal Aunt    Colon cancer Cousin    Social History   Socioeconomic History   Marital status: Single    Spouse name: Not on file   Number of children: 2   Years of education: Not on file   Highest education level: Some college, no degree  Occupational History   Not on file  Tobacco Use   Smoking status: Former    Current packs/day: 0.00    Types: Cigarettes    Start date: 03/16/2016    Quit date: 03/17/2018    Years since quitting: 4.5    Passive exposure: Past   Smokeless tobacco: Never  Vaping Use   Vaping status: Never Used  Substance and Sexual Activity   Alcohol use: Yes    Comment: rare   Drug use: Never   Sexual activity: Not Currently    Partners: Male    Birth control/protection: Pill  Other Topics Concern   Not on file  Social History Narrative   Not on file   Social Determinants of Health   Financial Resource Strain: Not on file  Food Insecurity: Not on file  Transportation Needs: No Transportation Needs (05/19/2020)   PRAPARE -  Administrator, Civil Service (Medical): No    Lack of Transportation (Non-Medical): No  Physical Activity: Not on file  Stress: Not on file  Social Connections: Not on file   Past Surgical History:  Procedure Laterality Date   BLADDER SURGERY  09/2021   Endometriosis   CESAREAN SECTION  1999;  07/ 2009   CYSTOSCOPY N/A 03/21/2020   Procedure: CYSTOSCOPY with biopsy;  Surgeon: Marguerita Beards, MD;  Location: [facility];  Service: Gynecology;  Laterality: N/A;  Total procedure time 1 hour   CYSTOSCOPY WITH URETHRAL DILATATION Right 05/2018   W/  TURBT   (right ureteral stricture)   DIAGNOSTIC LAPAROSCOPY  12/2016   DILATION AND CURETTAGE OF UTERUS  yrs ago   HEMICOLECTOMY  02/2017   fews days post op TAH for bowel obstruction   TOTAL ABDOMINAL HYSTERECTOMY  02/2017   Past Surgical History:  Procedure Laterality Date   BLADDER SURGERY  09/2021   Endometriosis   CESAREAN SECTION  1999;  07/ 2009   CYSTOSCOPY N/A 03/21/2020   Procedure: CYSTOSCOPY with biopsy;  Surgeon: Marguerita Beards, MD;  Location: [facility];  Service: Gynecology;  Laterality: N/A;  Total procedure time 1 hour   CYSTOSCOPY WITH  URETHRAL DILATATION Right 05/2018   W/  TURBT   (right ureteral stricture)   DIAGNOSTIC LAPAROSCOPY  12/2016   DILATION AND CURETTAGE OF UTERUS  yrs ago   HEMICOLECTOMY  02/2017   fews days post op TAH for bowel obstruction   TOTAL ABDOMINAL HYSTERECTOMY  02/2017   Past Medical History:  Diagnosis Date   Bladder mass    Dysuria    Endometriosis    Endometriosis, bladder    Hematuria    History of 2019 novel coronavirus disease (COVID-19) 08/24/2019   positive covid test result in epic, per pt mild symptoms that resolved   History of small bowel obstruction 02/2017   2019 post op TAH  w/ surgical bowel resection   Lower urinary tract symptoms (LUTS)    Nephrolithiasis    per pt nonobstructive   BP 115/78   Pulse 94   Ht  5' (1.524 m)   Wt 125 lb (56.7 kg)   SpO2 100%   BMI 24.41 kg/m   Opioid Risk Score:   Fall Risk Score:  `1  Depression screen PHQ 2/9     10/01/2022    3:24 PM 07/23/2022    3:43 PM 05/28/2022    3:01 PM 04/02/2022    2:54 PM 02/02/2022   11:00 AM 12/29/2021   10:36 AM 11/28/2021   11:06 AM  Depression screen PHQ 2/9  Decreased Interest 0 0 0 0 0 0 1  Down, Depressed, Hopeless 0 0 0 0 0 0 1  PHQ - 2 Score 0 0 0 0 0 0 2  Altered sleeping      0   Tired, decreased energy      0   Change in appetite      0   Feeling bad or failure about yourself       0   Trouble concentrating      0   Moving slowly or fidgety/restless      0   Suicidal thoughts      0   PHQ-9 Score      0       Review of Systems  Musculoskeletal:        RT hip pelvic pain  All other systems reviewed and are negative.      Objective:   Physical Exam Vitals and nursing note reviewed.  Constitutional:      Appearance: Normal appearance.  Cardiovascular:     Rate and Rhythm: Normal rate and regular rhythm.     Pulses: Normal pulses.     Heart sounds: Normal heart sounds.  Pulmonary:     Effort: Pulmonary effort is normal.     Breath sounds: Normal breath sounds.  Musculoskeletal:     Cervical back: Normal range of motion and neck supple.     Comments: Normal Muscle Bulk and Muscle Testing Reveals:  Upper Extremities: Full ROM and Muscle Strength 5/5 Lower Extremities: Full ROM and Muscle Strength 5/5 Arises from Table with ease Narrow Based  Gait     Skin:    General: Skin is warm and dry.  Neurological:     Mental Status: She is alert and oriented to person, place, and time.  Psychiatric:        Mood and Affect: Mood normal.        Behavior: Behavior normal.         Assessment & Plan:  Chronic abdominal pain secondary to endometriosis/ Pelvic Pain: S/P PR LAP,FULGURATE/EXCISE LESIONS  PR EXC  TUMOR SOFT TISSUE ABDOMINAL WALL SUBQ <3CM  PR CYSTOURETHROSCOPY  PR PART REMV  BLADDER,SIMPLE  LAPAROSCOPY, SURGICAL; W/FULGURATION OR EXCISION OF LESIONS OVARY,  PELVIC VISCERA, PERITONEAL SURFACE  EXCISION, TUMOR, SOFT TISSUE OF ABDOMINAL WALL, SUBCUTANEOUS; LESS  THAN 3 CM S/P on 09/28/2021.Dr Elpidio Galea.  Continue  to Monitor. 10/01/2022 2.  Chronic abdominal pain secondary to endometriosis/ Pelvic Pain: S/PSurgery on 09/28/2021, GYN following. Continue current medication regimen. Refilled : Oxycodone 10 mg/325 mg one tablet daily as needed for pain. #30. We will continue the opioid monitoring program, this consists of regular clinic visits, examinations, urine drug screen, pill counts as well as use of [place] Controlled Substance Reporting system. A 12 month History has been reviewed on the [place] Controlled Substance Reporting System on 10/01/2022 3. Chronic Pain Syndrome: Continue current medication . Continue HEP as Tolerated . Continue to Monitor. 10/01/2022   F/U in 2 months

## 2022-10-03 ENCOUNTER — Ambulatory Visit
Admission: RE | Admit: 2022-10-03 | Discharge: 2022-10-03 | Disposition: A | Discharge status: Home / Self Care | Payer: 59 | Source: Ambulatory Visit | Attending: Obstetrics and Gynecology | Admitting: Obstetrics and Gynecology

## 2022-10-03 DIAGNOSIS — N6320 Unspecified lump in the left breast, unspecified quadrant: Secondary | ICD-10-CM

## 2022-10-05 LAB — TOXASSURE SELECT,+ANTIDEPR,UR

## 2022-11-07 ENCOUNTER — Other Ambulatory Visit (HOSPITAL_COMMUNITY)
Admission: RE | Admit: 2022-11-07 | Discharge: 2022-11-07 | Disposition: A | Discharge status: Home / Self Care | Payer: 59 | Source: Ambulatory Visit | Attending: Obstetrics and Gynecology | Admitting: Obstetrics and Gynecology

## 2022-11-07 ENCOUNTER — Encounter: Payer: Self-pay | Admitting: Obstetrics and Gynecology

## 2022-11-07 ENCOUNTER — Ambulatory Visit: Payer: 59 | Admitting: Obstetrics and Gynecology

## 2022-11-07 VITALS — BP 131/86 | HR 92 | Wt 123.0 lb

## 2022-11-07 DIAGNOSIS — Z113 Encounter for screening for infections with a predominantly sexual mode of transmission: Secondary | ICD-10-CM | POA: Diagnosis not present

## 2022-11-07 DIAGNOSIS — Z0000 Encounter for general adult medical examination without abnormal findings: Secondary | ICD-10-CM

## 2022-11-07 NOTE — Progress Notes (Signed)
Pt is in office for std screening.

## 2022-11-07 NOTE — Progress Notes (Signed)
42 yo P2 here for STI screening. Patient is without any complaints. She had a hysterectomy in 2019. She is sexually active without complaints. She denies pelvic pain or abnormal discharge. Patient is being followed for pain management of endometriosis. She is without any other complaints  Past Medical History:  Diagnosis Date   Bladder mass    Dysuria    Endometriosis    Endometriosis, bladder    Hematuria    History of 2019 novel coronavirus disease (COVID-19) 08/24/2019   positive covid test result in epic, per pt mild symptoms that resolved   History of small bowel obstruction 02/2017   2019 post op TAH  w/ surgical bowel resection   Lower urinary tract symptoms (LUTS)    Nephrolithiasis    per pt nonobstructive   Past Surgical History:  Procedure Laterality Date   BLADDER SURGERY  09/2021   Endometriosis   CESAREAN SECTION  1999;  07/ 2009   CYSTOSCOPY N/A 03/21/2020   Procedure: CYSTOSCOPY with biopsy;  Surgeon: Marguerita Beards, MD;  Location: [facility];  Service: Gynecology;  Laterality: N/A;  Total procedure time 1 hour   CYSTOSCOPY WITH URETHRAL DILATATION Right 05/2018   W/  TURBT   (right ureteral stricture)   DIAGNOSTIC LAPAROSCOPY  12/2016   DILATION AND CURETTAGE OF UTERUS  yrs ago   HEMICOLECTOMY  02/2017   fews days post op TAH for bowel obstruction   TOTAL ABDOMINAL HYSTERECTOMY  02/2017   Family History  Problem Relation Age of Onset   Ovarian cancer Maternal Aunt    Colon cancer Cousin    Social History   Tobacco Use   Smoking status: Former    Current packs/day: 0.00    Types: Cigarettes    Start date: 03/16/2016    Quit date: 03/17/2018    Years since quitting: 4.6    Passive exposure: Past   Smokeless tobacco: Never  Vaping Use   Vaping status: Never Used  Substance Use Topics   Alcohol use: Yes    Comment: rare   Drug use: Never   ROS See pertinent in HPI. All other systems reviewed and non contributory  Blood pressure  131/86, pulse 92, weight 123 lb (55.8 kg). GENERAL: Well-developed, well-nourished female in no acute distress.  HEENT: Normocephalic, atraumatic. Sclerae anicteric.  NECK: Supple. Normal thyroid.  LUNGS: Clear to auscultation bilaterally.  HEART: Regular rate and rhythm. BREASTS: Symmetric in size. No palpable masses or lymphadenopathy, skin changes, or nipple drainage. ABDOMEN: Soft, nontender, nondistended. No organomegaly. PELVIC: Normal external female genitalia. Vagina is pink and rugated.  Normal discharge. Normal appearing cervix. Uterus is normal in size. No adnexal mass or tenderness. Chaperone present during the pelvic exam EXTREMITIES: No cyanosis, clubbing, or edema, 2+ distal pulses.  A/P 42 yo P2 here for STI screening - Vaginal swab collected - Health maintenance labs ordered - pap smear no longer indicated - Patient current on mammogram - Patient will be contacted with abnormal results

## 2022-11-08 ENCOUNTER — Telehealth: Payer: Self-pay | Admitting: Registered Nurse

## 2022-11-08 LAB — COMPREHENSIVE METABOLIC PANEL
ALT: 9 [IU]/L (ref 0–32)
AST: 11 [IU]/L (ref 0–40)
Albumin: 4.3 g/dL (ref 3.9–4.9)
Alkaline Phosphatase: 77 [IU]/L (ref 44–121)
BUN/Creatinine Ratio: 19 (ref 9–23)
BUN: 15 mg/dL (ref 6–24)
Bilirubin Total: 0.3 mg/dL (ref 0.0–1.2)
CO2: 23 mmol/L (ref 20–29)
Calcium: 9.8 mg/dL (ref 8.7–10.2)
Chloride: 105 mmol/L (ref 96–106)
Creatinine, Ser: 0.77 mg/dL (ref 0.57–1.00)
Globulin, Total: 2.9 g/dL (ref 1.5–4.5)
Glucose: 71 mg/dL (ref 70–99)
Potassium: 3.9 mmol/L (ref 3.5–5.2)
Sodium: 141 mmol/L (ref 134–144)
Total Protein: 7.2 g/dL (ref 6.0–8.5)
eGFR: 99 mL/min/{1.73_m2} (ref >59)

## 2022-11-08 LAB — HEPATITIS C ANTIBODY: Hep C Virus Ab: NONREACTIVE

## 2022-11-08 LAB — CBC
Hematocrit: 37.8 % (ref 34.0–46.6)
Hemoglobin: 11.8 g/dL (ref 11.1–15.9)
MCH: 27.2 pg (ref 26.6–33.0)
MCHC: 31.2 g/dL — ABNORMAL LOW (ref 31.5–35.7)
MCV: 87 fL (ref 79–97)
Platelets: 209 10*3/uL (ref 150–450)
RBC: 4.34 x10E6/uL (ref 3.77–5.28)
RDW: 13.6 % (ref 11.7–15.4)
WBC: 5.9 10*3/uL (ref 3.4–10.8)

## 2022-11-08 LAB — HEPATITIS B SURFACE ANTIGEN: Hepatitis B Surface Ag: NEGATIVE

## 2022-11-08 LAB — TSH: TSH: 1.19 u[IU]/mL (ref 0.450–4.500)

## 2022-11-08 LAB — HEMOGLOBIN A1C
Est. average glucose Bld gHb Est-mCnc: 108 mg/dL
Hgb A1c MFr Bld: 5.4 % (ref 4.8–5.6)

## 2022-11-08 LAB — LIPID PANEL
Chol/HDL Ratio: 2.5 ratio (ref 0.0–4.4)
Cholesterol, Total: 163 mg/dL (ref 100–199)
HDL: 65 mg/dL (ref >39)
LDL Chol Calc (NIH): 81 mg/dL (ref 0–99)
Triglycerides: 92 mg/dL (ref 0–149)
VLDL Cholesterol Cal: 17 mg/dL (ref 5–40)

## 2022-11-08 LAB — CERVICOVAGINAL ANCILLARY ONLY
Chlamydia: NEGATIVE
Comment: NEGATIVE
Comment: NORMAL
Neisseria Gonorrhea: NEGATIVE

## 2022-11-08 LAB — RPR: RPR Ser Ql: NONREACTIVE

## 2022-11-08 LAB — HIV ANTIBODY (ROUTINE TESTING W REFLEX): HIV Screen 4th Generation wRfx: NONREACTIVE

## 2022-11-08 MED ORDER — OXYCODONE-ACETAMINOPHEN 10-325 MG PO TABS: 1.0000 | ORAL_TABLET | Freq: Every day | ORAL | 0 refills | Status: DC | PRN

## 2022-11-08 NOTE — Telephone Encounter (Signed)
Medication List Reviewed.  Christine Brooks has a prescription on file.  Message sent to Ms. Rodd regarding the above.

## 2022-11-08 NOTE — Telephone Encounter (Signed)
PMP was Reviewed.  Oxycodone prescription was sent on 10/01/2022.  [facility] was called, representative stating they didn't receive the Oxycodone prescription. Ne prescription sent to today. Christine Brooks is aware via My-Chart message.

## 2022-12-03 ENCOUNTER — Encounter: Payer: 59 | Attending: Physical Medicine and Rehabilitation | Admitting: Registered Nurse

## 2022-12-03 ENCOUNTER — Encounter: Payer: Self-pay | Admitting: Registered Nurse

## 2022-12-03 ENCOUNTER — Ambulatory Visit (HOSPITAL_COMMUNITY)
Admission: RE | Admit: 2022-12-03 | Discharge: 2022-12-03 | Disposition: A | Discharge status: Home / Self Care | Payer: 59 | Source: Ambulatory Visit | Attending: Registered Nurse | Admitting: Registered Nurse

## 2022-12-03 VITALS — BP 119/79 | HR 85 | Ht 60.0 in | Wt 126.0 lb

## 2022-12-03 DIAGNOSIS — Z5181 Encounter for therapeutic drug level monitoring: Secondary | ICD-10-CM | POA: Diagnosis not present

## 2022-12-03 DIAGNOSIS — G8929 Other chronic pain: Secondary | ICD-10-CM

## 2022-12-03 DIAGNOSIS — G894 Chronic pain syndrome: Secondary | ICD-10-CM

## 2022-12-03 DIAGNOSIS — M25562 Pain in left knee: Secondary | ICD-10-CM | POA: Insufficient documentation

## 2022-12-03 DIAGNOSIS — R102 Pelvic and perineal pain: Secondary | ICD-10-CM

## 2022-12-03 DIAGNOSIS — M25561 Pain in right knee: Secondary | ICD-10-CM | POA: Insufficient documentation

## 2022-12-03 MED ORDER — OXYCODONE-ACETAMINOPHEN 10-325 MG PO TABS: 1.0000 | ORAL_TABLET | Freq: Every day | ORAL | 0 refills | Status: DC | PRN

## 2022-12-03 NOTE — Progress Notes (Signed)
Subjective:    Patient ID: Christine Brooks, female    DOB: 01-09-1981, 42 y.o.   MRN: [id number]  HPI: Christine Brooks is a 42 y.o. female who returns for follow up appointment for chronic pain and medication refill. She reports pelvic pressure  and bilateral knee pain R>L. She rates her pain today 0, on Health and History form.Her  current exercise regime is walking.  Christine Brooks Morphine equivalent is 15.00 MME.   Last UDS was Performed on 10/01/2022, Christine Brooks reports she is taking her medication as prescribed.    Pain Inventory Average Pain 10 Pain Right Now 0 My pain is sharp and stabbing  In the last 24 hours, has pain interfered with the following? General activity 2 Relation with others 2 Enjoyment of life 2 What TIME of day is your pain at its worst? varies Sleep (in general) Fair  Pain is worse with: unsure Pain improves with: rest and medication Relief from Meds: 10  Family History  Problem Relation Age of Onset   Ovarian cancer Maternal Aunt    Colon cancer Cousin    Social History   Socioeconomic History   Marital status: Single    Spouse name: Not on file   Number of children: 2   Years of education: Not on file   Highest education level: Some college, no degree  Occupational History   Not on file  Tobacco Use   Smoking status: Former    Current packs/day: 0.00    Types: Cigarettes    Start date: 03/16/2016    Quit date: 03/17/2018    Years since quitting: 4.7    Passive exposure: Past   Smokeless tobacco: Never  Vaping Use   Vaping status: Never Used  Substance and Sexual Activity   Alcohol use: Yes    Comment: rare   Drug use: Never   Sexual activity: Not Currently    Partners: Male    Birth control/protection: Pill  Other Topics Concern   Not on file  Social History Narrative   Not on file   Social Determinants of Health   Financial Resource Strain: Not on file  Food Insecurity: Not on file  Transportation Needs: No Transportation Needs  (05/19/2020)   PRAPARE - Administrator, Civil Service (Medical): No    Lack of Transportation (Non-Medical): No  Physical Activity: Not on file  Stress: Not on file  Social Connections: Not on file   Past Surgical History:  Procedure Laterality Date   BLADDER SURGERY  09/2021   Endometriosis   CESAREAN SECTION  1999;  07/ 2009   CYSTOSCOPY N/A 03/21/2020   Procedure: CYSTOSCOPY with biopsy;  Surgeon: Marguerita Beards, MD;  Location: [facility];  Service: Gynecology;  Laterality: N/A;  Total procedure time 1 hour   CYSTOSCOPY WITH URETHRAL DILATATION Right 05/2018   W/  TURBT   (right ureteral stricture)   DIAGNOSTIC LAPAROSCOPY  12/2016   DILATION AND CURETTAGE OF UTERUS  yrs ago   HEMICOLECTOMY  02/2017   fews days post op TAH for bowel obstruction   TOTAL ABDOMINAL HYSTERECTOMY  02/2017   Past Surgical History:  Procedure Laterality Date   BLADDER SURGERY  09/2021   Endometriosis   CESAREAN SECTION  1999;  07/ 2009   CYSTOSCOPY N/A 03/21/2020   Procedure: CYSTOSCOPY with biopsy;  Surgeon: Marguerita Beards, MD;  Location: [facility];  Service: Gynecology;  Laterality: N/A;  Total procedure time  1 hour   CYSTOSCOPY WITH URETHRAL DILATATION Right 05/2018   W/  TURBT   (right ureteral stricture)   DIAGNOSTIC LAPAROSCOPY  12/2016   DILATION AND CURETTAGE OF UTERUS  yrs ago   HEMICOLECTOMY  02/2017   fews days post op TAH for bowel obstruction   TOTAL ABDOMINAL HYSTERECTOMY  02/2017   Past Medical History:  Diagnosis Date   Bladder mass    Dysuria    Endometriosis    Endometriosis, bladder    Hematuria    History of 2019 novel coronavirus disease (COVID-19) 08/24/2019   positive covid test result in epic, per pt mild symptoms that resolved   History of small bowel obstruction 02/2017   2019 post op TAH  w/ surgical bowel resection   Lower urinary tract symptoms (LUTS)    Nephrolithiasis    per pt nonobstructive    There were no vitals taken for this visit.  Opioid Risk Score:   Fall Risk Score:  `1  Depression screen PHQ 2/9     10/01/2022    3:24 PM 07/23/2022    3:43 PM 05/28/2022    3:01 PM 04/02/2022    2:54 PM 02/02/2022   11:00 AM 12/29/2021   10:36 AM 11/28/2021   11:06 AM  Depression screen PHQ 2/9  Decreased Interest 0 0 0 0 0 0 1  Down, Depressed, Hopeless 0 0 0 0 0 0 1  PHQ - 2 Score 0 0 0 0 0 0 2  Altered sleeping      0   Tired, decreased energy      0   Change in appetite      0   Feeling bad or failure about yourself       0   Trouble concentrating      0   Moving slowly or fidgety/restless      0   Suicidal thoughts      0   PHQ-9 Score      0       Review of Systems  Genitourinary:  Positive for vaginal pain.  Musculoskeletal:        Right hip pain   All other systems reviewed and are negative.     Objective:   Physical Exam Vitals and nursing note reviewed.  Constitutional:      Appearance: Normal appearance.  Cardiovascular:     Rate and Rhythm: Normal rate and regular rhythm.     Pulses: Normal pulses.     Heart sounds: Normal heart sounds.  Pulmonary:     Effort: Pulmonary effort is normal.     Breath sounds: Normal breath sounds.  Musculoskeletal:     Comments: Normal Muscle Bulk and Muscle Testing Reveals:  Upper Extremities: Full ROM and Muscle Strength 5/5 Bilateral AC Joint Tenderness   Lower Extremities: Full ROM and Muscle Strength 5/5 Arises from Table with ease Narrow Based  Gait     Skin:    General: Skin is warm and dry.  Neurological:     Mental Status: She is alert and oriented to person, place, and time.  Psychiatric:        Mood and Affect: Mood normal.        Behavior: Behavior normal.         Assessment & Plan:  Chronic abdominal pain secondary to endometriosis/ Pelvic Pain: S/P PR LAP,FULGURATE/EXCISE LESIONS  PR EXC TUMOR SOFT TISSUE ABDOMINAL WALL SUBQ <3CM  PR CYSTOURETHROSCOPY  PR PART REMV BLADDER,SIMPLE  LAPAROSCOPY, SURGICAL; W/FULGURATION OR EXCISION OF LESIONS OVARY,  PELVIC VISCERA, PERITONEAL SURFACE  EXCISION, TUMOR, SOFT TISSUE OF ABDOMINAL WALL, SUBCUTANEOUS; LESS  THAN 3 CM S/P on 09/28/2021.Dr Elpidio Galea.  Continue  to Monitor. 12/03/2022 2.  Chronic abdominal pain secondary to endometriosis/ Pelvic Pain: S/PSurgery on 09/28/2021, GYN following. Continue current medication regimen. Refilled : Oxycodone 10 mg/325 mg one tablet daily as needed for pain. #30. Second script sent for the following month.We will continue the opioid monitoring program, this consists of regular clinic visits, examinations, urine drug screen, pill counts as well as use of [place] Controlled Substance Reporting system. A 12 month History has been reviewed on the [place] Controlled Substance Reporting System on 12/03/2022 3. Chronic Pain Syndrome: Continue current medication . Continue HEP as Tolerated . Continue to Monitor. 12/03/2022   F/U in 2 months

## 2023-02-04 ENCOUNTER — Encounter: Payer: Self-pay | Admitting: Registered Nurse

## 2023-02-04 ENCOUNTER — Encounter: Payer: 59 | Attending: Physical Medicine and Rehabilitation | Admitting: Registered Nurse

## 2023-02-04 ENCOUNTER — Encounter: Payer: Self-pay | Admitting: Physical Medicine and Rehabilitation

## 2023-02-04 VITALS — BP 118/79 | HR 59 | Ht 60.0 in | Wt 121.0 lb

## 2023-02-04 DIAGNOSIS — Z5181 Encounter for therapeutic drug level monitoring: Secondary | ICD-10-CM | POA: Diagnosis not present

## 2023-02-04 DIAGNOSIS — R102 Pelvic and perineal pain: Secondary | ICD-10-CM | POA: Insufficient documentation

## 2023-02-04 DIAGNOSIS — M25562 Pain in left knee: Secondary | ICD-10-CM | POA: Insufficient documentation

## 2023-02-04 DIAGNOSIS — G8929 Other chronic pain: Secondary | ICD-10-CM | POA: Diagnosis present

## 2023-02-04 DIAGNOSIS — G894 Chronic pain syndrome: Secondary | ICD-10-CM | POA: Insufficient documentation

## 2023-02-04 DIAGNOSIS — M25561 Pain in right knee: Secondary | ICD-10-CM | POA: Diagnosis not present

## 2023-02-04 DIAGNOSIS — R1013 Epigastric pain: Secondary | ICD-10-CM | POA: Insufficient documentation

## 2023-02-04 MED ORDER — OXYCODONE-ACETAMINOPHEN 10-325 MG PO TABS: 1.0000 | ORAL_TABLET | Freq: Every day | ORAL | 0 refills | Status: DC | PRN

## 2023-02-04 NOTE — Progress Notes (Signed)
Subjective:    Patient ID: Christine Brooks, female    DOB: 1980-05-28, 43 y.o.   MRN: [id number]  HPI: Christine Brooks is a 43 y.o. female who returns for follow up appointment for chronic pain and medication refill.She  states her pain is usually  located in  her pelviv, she took her medication this morning and at this time she is pain free.  Also reports bilateral knee pain occasionally. She rates her pain 0.  She is not following her current exercise regimen, she was encouraged to increase her HEP as tolerated. She verbalizes understanding.   Christine Brooks Morphine equivalent is 15.00 MME.   UDS ordered today.    Pain Inventory Average Pain 10 Pain Right Now 0 My pain is sharp, burning, dull, stabbing, and aching  In the last 24 hours, has pain interfered with the following? General activity 0 Relation with others 0 Enjoyment of life 0 What TIME of day is your pain at its worst? varies Sleep (in general) Fair  Pain is worse with: walking, bending, sitting, standing, and some activites Pain improves with: rest and medication Relief from Meds: 10  Family History  Problem Relation Age of Onset   Ovarian cancer Maternal Aunt    Colon cancer Cousin    Social History   Socioeconomic History   Marital status: Single    Spouse name: Not on file   Number of children: 2   Years of education: Not on file   Highest education level: Some college, no degree  Occupational History   Not on file  Tobacco Use   Smoking status: Former    Current packs/day: 0.00    Types: Cigarettes    Start date: 03/16/2016    Quit date: 03/17/2018    Years since quitting: 4.8    Passive exposure: Past   Smokeless tobacco: Never  Vaping Use   Vaping status: Never Used  Substance and Sexual Activity   Alcohol use: Yes    Comment: rare   Drug use: Never   Sexual activity: Not Currently    Partners: Male    Birth control/protection: Pill  Other Topics Concern   Not on file  Social History Narrative    Not on file   Social Drivers of Health   Financial Resource Strain: Not on file  Food Insecurity: Not on file  Transportation Needs: No Transportation Needs (05/19/2020)   PRAPARE - Administrator, Civil Service (Medical): No    Lack of Transportation (Non-Medical): No  Physical Activity: Not on file  Stress: Not on file  Social Connections: Not on file   Past Surgical History:  Procedure Laterality Date   BLADDER SURGERY  09/2021   Endometriosis   CESAREAN SECTION  1999;  07/ 2009   CYSTOSCOPY N/A 03/21/2020   Procedure: CYSTOSCOPY with biopsy;  Surgeon: Marguerita Beards, MD;  Location: [facility];  Service: Gynecology;  Laterality: N/A;  Total procedure time 1 hour   CYSTOSCOPY WITH URETHRAL DILATATION Right 05/2018   W/  TURBT   (right ureteral stricture)   DIAGNOSTIC LAPAROSCOPY  12/2016   DILATION AND CURETTAGE OF UTERUS  yrs ago   HEMICOLECTOMY  02/2017   fews days post op TAH for bowel obstruction   TOTAL ABDOMINAL HYSTERECTOMY  02/2017   Past Surgical History:  Procedure Laterality Date   BLADDER SURGERY  09/2021   Endometriosis   CESAREAN SECTION  1999;  07/ 2009   CYSTOSCOPY N/A  03/21/2020   Procedure: CYSTOSCOPY with biopsy;  Surgeon: Marguerita Beards, MD;  Location: [facility];  Service: Gynecology;  Laterality: N/A;  Total procedure time 1 hour   CYSTOSCOPY WITH URETHRAL DILATATION Right 05/2018   W/  TURBT   (right ureteral stricture)   DIAGNOSTIC LAPAROSCOPY  12/2016   DILATION AND CURETTAGE OF UTERUS  yrs ago   HEMICOLECTOMY  02/2017   fews days post op TAH for bowel obstruction   TOTAL ABDOMINAL HYSTERECTOMY  02/2017   Past Medical History:  Diagnosis Date   Bladder mass    Dysuria    Endometriosis    Endometriosis, bladder    Hematuria    History of 2019 novel coronavirus disease (COVID-19) 08/24/2019   positive covid test result in epic, per pt mild symptoms that resolved   History of small  bowel obstruction 02/2017   2019 post op TAH  w/ surgical bowel resection   Lower urinary tract symptoms (LUTS)    Nephrolithiasis    per pt nonobstructive   BP 118/79   Pulse (!) 59   Ht 5' (1.524 m)   Wt 121 lb (54.9 kg)   SpO2 98%   BMI 23.63 kg/m   Opioid Risk Score:   Fall Risk Score:  `1  Depression screen PHQ 2/9     02/04/2023    3:03 PM 12/03/2022    3:20 PM 10/01/2022    3:24 PM 07/23/2022    3:43 PM 05/28/2022    3:01 PM 04/02/2022    2:54 PM 02/02/2022   11:00 AM  Depression screen PHQ 2/9  Decreased Interest 0 0 0 0 0 0 0  Down, Depressed, Hopeless 0 0 0 0 0 0 0  PHQ - 2 Score 0 0 0 0 0 0 0      Review of Systems  Musculoskeletal:        Pelvic, stomach area   All other systems reviewed and are negative.     Objective:   Physical Exam Vitals and nursing note reviewed.  Cardiovascular:     Rate and Rhythm: Normal rate and regular rhythm.     Pulses: Normal pulses.     Heart sounds: Normal heart sounds.  Pulmonary:     Effort: Pulmonary effort is normal.     Breath sounds: Normal breath sounds.  Musculoskeletal:     Comments: Normal Muscle Bulk and Muscle Testing Reveals:  Upper Extremities: Full ROM and Muscle Strength  Lower Extremities: Full ROM and Muscle Strength 5/5 Arises from Table with ease  Narrow Based Gait     Skin:    General: Skin is warm and dry.  Neurological:     Mental Status: She is oriented to person, place, and time.  Psychiatric:        Mood and Affect: Mood normal.        Behavior: Behavior normal.          Assessment & Plan:  Chronic abdominal pain secondary to endometriosis/ Pelvic Pain: S/P PR LAP,FULGURATE/EXCISE LESIONS  PR EXC TUMOR SOFT TISSUE ABDOMINAL WALL SUBQ <3CM  PR CYSTOURETHROSCOPY  PR PART REMV BLADDER,SIMPLE  LAPAROSCOPY, SURGICAL; W/FULGURATION OR EXCISION OF LESIONS OVARY,  PELVIC VISCERA, PERITONEAL SURFACE  EXCISION, TUMOR, SOFT TISSUE OF ABDOMINAL WALL, SUBCUTANEOUS; LESS  THAN 3 CM S/P on  09/28/2021.Dr Elpidio Galea.  Continue  to Monitor. 02/04/2023 2.  Chronic abdominal pain secondary to endometriosis/ Pelvic Pain: S/PSurgery on 09/28/2021, GYN following. Continue current medication regimen. Refilled : Oxycodone  10 mg/325 mg one tablet daily as needed for pain. #30. Second script sent for the following month.We will continue the opioid monitoring program, this consists of regular clinic visits, examinations, urine drug screen, pill counts as well as use of [place] Controlled Substance Reporting system. A 12 month History has been reviewed on the [place] Controlled Substance Reporting System on 02/04/2023 3. Chronic Pain Syndrome: Continue current medication . Continue HEP as Tolerated . Continue to Monitor. 02/04/2023   F/U in 2 months

## 2023-02-06 LAB — TOXASSURE SELECT,+ANTIDEPR,UR

## 2023-02-12 ENCOUNTER — Telehealth: Payer: Self-pay | Admitting: *Deleted

## 2023-02-12 MED ORDER — OXYCODONE-ACETAMINOPHEN 10-325 MG PO TABS: 1.0000 | ORAL_TABLET | Freq: Every day | ORAL | 0 refills | Status: DC | PRN

## 2023-02-12 NOTE — Telephone Encounter (Signed)
[facility] was called, they only have the Oxycodone prescription for February. I sent her Oxycodone prescription to the pharmacy. This provider called Christine Brooks regarding the above, she verbalizes understanding.

## 2023-02-12 NOTE — Telephone Encounter (Signed)
Christine Brooks called and reports that her Rx is on hold at [facility] until 03/03/23.  She did not get a January Rx.  Per PMP last Rx filled was 01/08/23 #30.

## 2023-02-15 ENCOUNTER — Encounter: Payer: Self-pay | Admitting: Physical Medicine and Rehabilitation

## 2023-02-15 ENCOUNTER — Encounter (HOSPITAL_BASED_OUTPATIENT_CLINIC_OR_DEPARTMENT_OTHER): Payer: 59 | Admitting: Physical Medicine and Rehabilitation

## 2023-02-15 DIAGNOSIS — R1013 Epigastric pain: Secondary | ICD-10-CM | POA: Diagnosis not present

## 2023-02-15 DIAGNOSIS — G8929 Other chronic pain: Secondary | ICD-10-CM

## 2023-02-15 NOTE — Progress Notes (Signed)
Subjective:    Patient ID: Christine Brooks, female    DOB: Jul 01, 1980, 43 y.o.   MRN: [id number]  HPI An audio/video tele-health visit is felt to be the most appropriate encounter for this patient at this time. This is a follow up tele-visit via phone. The patient is at home. MD is at office. Prior to scheduling this appointment, our staff discussed the limitations of evaluation and management by telemedicine and the availability of in-person appointments. The patient expressed understanding and agreed to proceed.   Christine Brooks is a 43 year old woman who presents for follow-up of chronic abdominal pain.  1) Chronic abdominal pain -her surgery provided relief -she is back to working at the [employer] and has to do a lot of lifting -her pain started 6 years ago -taking a leave from work due to her pain.  -she needs a note for her surgery -she is scheduled for surgery in September.  -she was diagnosed with endometriosis. -it took a long time for her to be diagnosed -she was taking birth control to stop the spread of the endometriosis and she is not sure whether it helped. -on her last cystoscopy she had no lesions.  -she was taking ibuprofen and oxycodone. She takes both together. She takes this combination about one per day. She tries not to take this.  -the pain feels sharp, like contractions.  -she has MRI scheduled -she has 2 children and cannot have any more due to her partial hysterectomy.  -has not been sexually active for years due to the pain. -it just came up on her all of a sudden, she had never even heard of endometriosis.  -her goal is pain control -she has a lot of scar tissue  2) Depression -she developed depression due to her pain and how it is effecting her quality of life. -she finds that now her mind is in a better place since she gave her life to [employer]  3) Constipation -she gets this from her pain medication and has to take a laxative  Pain Inventory Average  Pain 9 Pain Right Now 0 My pain is intermittent, sharp, and stabbing  In the last 24 hours, has pain interfered with the following? General activity 0 Relation with others 0 Enjoyment of life 0 What TIME of day is your pain at its worst? varies Sleep (in general) Fair  Pain is worse with: unsure Pain improves with: rest and medication Relief from Meds: 10      Family History  Problem Relation Age of Onset   Ovarian cancer Maternal Aunt    Colon cancer Cousin    Social History   Socioeconomic History   Marital status: Single    Spouse name: Not on file   Number of children: 2   Years of education: Not on file   Highest education level: Some college, no degree  Occupational History   Not on file  Tobacco Use   Smoking status: Former    Current packs/day: 0.00    Types: Cigarettes    Start date: 03/16/2016    Quit date: 03/17/2018    Years since quitting: 4.9    Passive exposure: Past   Smokeless tobacco: Never  Vaping Use   Vaping status: Never Used  Substance and Sexual Activity   Alcohol use: Yes    Comment: rare   Drug use: Never   Sexual activity: Not Currently    Partners: Male    Birth control/protection: Pill  Other  Topics Concern   Not on file  Social History Narrative   Not on file   Social Drivers of Health   Financial Resource Strain: Not on file  Food Insecurity: Not on file  Transportation Needs: No Transportation Needs (05/19/2020)   PRAPARE - Administrator, Civil Service (Medical): No    Lack of Transportation (Non-Medical): No  Physical Activity: Not on file  Stress: Not on file  Social Connections: Not on file   Past Surgical History:  Procedure Laterality Date   BLADDER SURGERY  09/2021   Endometriosis   CESAREAN SECTION  1999;  07/ 2009   CYSTOSCOPY N/A 03/21/2020   Procedure: CYSTOSCOPY with biopsy;  Surgeon: Marguerita Beards, MD;  Location: [facility];  Service: Gynecology;  Laterality: N/A;   Total procedure time 1 hour   CYSTOSCOPY WITH URETHRAL DILATATION Right 05/2018   W/  TURBT   (right ureteral stricture)   DIAGNOSTIC LAPAROSCOPY  12/2016   DILATION AND CURETTAGE OF UTERUS  yrs ago   HEMICOLECTOMY  02/2017   fews days post op TAH for bowel obstruction   TOTAL ABDOMINAL HYSTERECTOMY  02/2017   Past Medical History:  Diagnosis Date   Bladder mass    Dysuria    Endometriosis    Endometriosis, bladder    Hematuria    History of 2019 novel coronavirus disease (COVID-19) 08/24/2019   positive covid test result in epic, per pt mild symptoms that resolved   History of small bowel obstruction 02/2017   2019 post op TAH  w/ surgical bowel resection   Lower urinary tract symptoms (LUTS)    Nephrolithiasis    per pt nonobstructive   There were no vitals taken for this visit.  Opioid Risk Score:   Fall Risk Score:  `1  Depression screen PHQ 2/9     02/04/2023    3:03 PM 12/03/2022    3:20 PM 10/01/2022    3:24 PM 07/23/2022    3:43 PM 05/28/2022    3:01 PM 04/02/2022    2:54 PM 02/02/2022   11:00 AM  Depression screen PHQ 2/9  Decreased Interest 0 0 0 0 0 0 0  Down, Depressed, Hopeless 0 0 0 0 0 0 0  PHQ - 2 Score 0 0 0 0 0 0 0      Review of Systems  Constitutional: Negative.   HENT: Negative.    Eyes: Negative.   Respiratory: Negative.    Cardiovascular: Negative.   Gastrointestinal:  Positive for abdominal pain.  Endocrine: Negative.   Genitourinary:  Positive for pelvic pain.  Musculoskeletal: Negative.   Skin: Negative.   Allergic/Immunologic: Negative.   Neurological: Negative.   Hematological: Negative.   All other systems reviewed and are negative.      Objective:   Physical Exam PRIOR EXAM Gen: no distress, normal appearing HEENT: oral mucosa pink and moist, NCAT Cardio: Reg rate Chest: normal effort, normal rate of breathing Abd: soft, non-distended Ext: no edema Psych: pleasant, normal affect, bright and cheerful Skin: intact Neuro:  Alert and oriented x3 Musculoskeletal: Normal ambulation    Assessment & Plan:  1) Chronic abdominal pain secondary to endometriosis -discussed that she has asked for work accommodations for her to be on the first step as the work load is less then -discussed that she has recurrence of pain when she has to lift heavy loads -Discussed current symptoms of pain and history of pain.  -Discussed benefits of exercise in reducing  pain. -Pain contact and urine sample signed previously -continue Percocet -provided note for work -Discussed current symptoms of pain and history of pain.  -Discussed benefits of exercise in reducing pain. -Discussed following foods that may reduce pain: 1) Ginger (especially studied for arthritis)- reduce leukotriene production to decrease inflammation 2) Blueberries- high in phytonutrients that decrease inflammation 3) Salmon- marine omega-3s reduce joint swelling and pain 4) Pumpkin seeds- reduce inflammation 5) dark chocolate- reduces inflammation 6) turmeric- reduces inflammation 7) tart cherries - reduce pain and stiffness 8) extra virgin olive oil - its compound olecanthal helps to block prostaglandins  9) chili peppers- can be eaten or applied topically via capsaicin 10) mint- helpful for headache, muscle aches, joint pain, and itching 11) garlic- reduces inflammation  Link to further information on diet for chronic pain: [email or web address]   2) Depression: -discussed her prior depression, she has been able to get over this.  -encouraged her religious practice which has greatly helped her.   3) Constipation:  -Provided list of following foods that help with constipation and highlighted a few: 1) prunes- contain high amounts of fiber.  2) apples- has a form of dietary fiber called pectin that accelerates stool movement and increases beneficial gut bacteria 3) pears- in addition to  fiber, also high in fructose and sorbitol which have laxative effect 4) figs- contain an enzyme ficin which helps to speed colonic transit 5) kiwis- contain an enzyme actinidin that improves gut motility and reduces constipation 6) oranges- rich in pectin (like apples) 7) grapefruits- contain a flavanol naringenin which has a laxative effect 8) vegetables- rich in fiber and also great sources of folate, vitamin C, and K 9) artichoke- high in inulin, prebiotic great for the microbiome 10) chicory- increases stool frequency and softness (can be added to coffee) 11) rhubarb- laxative effect 12) sweet potato- high fiber 13) beans, peas, and lentils- contain both soluble and insoluble fiber 14) chia seeds- improves intestinal health and gut flora 15) flaxseeds- laxative effect 16) whole grain rye bread- high in fiber 17) oat bran- high in soluble and insoluble fiber 18) kefir- softens stools -recommended to try at least one of these foods every day.  -drink 6-8 glasses of water per day -walk regularly, especially after meals.      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

## 2023-02-27 ENCOUNTER — Other Ambulatory Visit: Payer: Self-pay | Admitting: Obstetrics

## 2023-02-27 ENCOUNTER — Ambulatory Visit (INDEPENDENT_AMBULATORY_CARE_PROVIDER_SITE_OTHER): Payer: 59 | Admitting: Obstetrics

## 2023-02-27 ENCOUNTER — Encounter: Payer: Self-pay | Admitting: Obstetrics

## 2023-02-27 ENCOUNTER — Other Ambulatory Visit (HOSPITAL_COMMUNITY)
Admission: RE | Admit: 2023-02-27 | Discharge: 2023-02-27 | Disposition: A | Discharge status: Home / Self Care | Payer: 59 | Source: Ambulatory Visit | Attending: Obstetrics | Admitting: Obstetrics

## 2023-02-27 VITALS — BP 109/71 | HR 100 | Wt 127.9 lb

## 2023-02-27 DIAGNOSIS — Z113 Encounter for screening for infections with a predominantly sexual mode of transmission: Secondary | ICD-10-CM | POA: Diagnosis not present

## 2023-02-27 DIAGNOSIS — N898 Other specified noninflammatory disorders of vagina: Secondary | ICD-10-CM | POA: Diagnosis present

## 2023-02-27 DIAGNOSIS — Z01419 Encounter for gynecological examination (general) (routine) without abnormal findings: Secondary | ICD-10-CM

## 2023-02-27 DIAGNOSIS — N6002 Solitary cyst of left breast: Secondary | ICD-10-CM | POA: Diagnosis not present

## 2023-02-27 DIAGNOSIS — N809 Endometriosis, unspecified: Secondary | ICD-10-CM

## 2023-02-27 NOTE — Progress Notes (Signed)
Subjective:        Christine Brooks is a 43 y.o. female here for a routine exam.  Current complaints: Left breast cyst..    Personal health questionnaire:  Is patient [name], have a family history of breast and/or ovarian cancer: yes Is there a family history of uterine cancer diagnosed at age < 61, gastrointestinal cancer, urinary tract cancer, family member who is a Personnel officer syndrome-associated carrier: yes Is the patient overweight and hypertensive, family history of diabetes, personal history of gestational diabetes, preeclampsia or PCOS: no Is patient over 74, have PCOS,  family history of premature CHD under age 36, diabetes, smoke, have hypertension or peripheral artery disease:  no At any time, has a partner hit, kicked or otherwise hurt or frightened you?: no Over the past 2 weeks, have you felt down, depressed or hopeless?: no Over the past 2 weeks, have you felt little interest or pleasure in doing things?:no   Gynecologic History Patient's last menstrual period was 08/11/2016 (exact date). Contraception: status post hysterectomy Last Pap: 2017. Results were: normal Last mammogram: 08/2022. Results were: abnormal  Obstetric History OB History  Gravida Para Term Preterm AB Living  3 2 2  1 2   SAB IAB Ectopic Multiple Live Births   1   2    # Outcome Date GA Lbr Len/2nd Weight Sex Type Anes PTL Lv  3 IAB 06/16/14          2 Term 07/29/07    M CS-LTranv   LIV  1 Term 12/07/97 [redacted]w[redacted]d   M CS-LTranv   LIV    Past Medical History:  Diagnosis Date   Bladder mass    Dysuria    Endometriosis    Endometriosis, bladder    Hematuria    History of 2019 novel coronavirus disease (COVID-19) 08/24/2019   positive covid test result in epic, per pt mild symptoms that resolved   History of small bowel obstruction 02/2017   2019 post op TAH  w/ surgical bowel resection   Lower urinary tract symptoms (LUTS)    Nephrolithiasis    per pt nonobstructive    Past Surgical  History:  Procedure Laterality Date   BLADDER SURGERY  09/2021   Endometriosis   CESAREAN SECTION  1999;  07/ 2009   CYSTOSCOPY N/A 03/21/2020   Procedure: CYSTOSCOPY with biopsy;  Surgeon: Marguerita Beards, MD;  Location: [facility];  Service: Gynecology;  Laterality: N/A;  Total procedure time 1 hour   CYSTOSCOPY WITH URETHRAL DILATATION Right 05/2018   W/  TURBT   (right ureteral stricture)   DIAGNOSTIC LAPAROSCOPY  12/2016   DILATION AND CURETTAGE OF UTERUS  yrs ago   HEMICOLECTOMY  02/2017   fews days post op TAH for bowel obstruction   TOTAL ABDOMINAL HYSTERECTOMY  02/2017     Current Outpatient Medications:    acetaminophen (TYLENOL) 325 MG tablet, Take by mouth., Disp: , Rfl:    Cholecalciferol (VITAMIN D) 50 MCG (2000 UT) CAPS, Take 1 capsule (2,000 Units total) by mouth daily before breakfast., Disp: 30 capsule, Rfl: 11   Fluocinolone Acetonide Scalp 0.01 % OIL, Apply topically at bedtime., Disp: , Rfl:    ibuprofen (ADVIL) 800 MG tablet, Take 1 tablet (800 mg total) by mouth every 8 (eight) hours as needed., Disp: 30 tablet, Rfl: 5   norethindrone (AYGESTIN) 5 MG tablet, Take 1 tablet (5 mg total) by mouth daily., Disp: 30 tablet, Rfl: 11   oxyCODONE-acetaminophen (PERCOCET)  10-325 MG tablet, Take 1 tablet by mouth daily as needed for pain., Disp: 30 tablet, Rfl: 0   terconazole (TERAZOL 3) 0.8 % vaginal cream, Place 1 applicator vaginally at bedtime., Disp: 20 g, Rfl: 0   Vibegron (GEMTESA) 75 MG TABS, TAKE 1 TABLET BY MOUTH EVERY DAY, Disp: 30 tablet, Rfl: 5  Current Facility-Administered Medications:    sodium bicarbonate 4.2 % injection 2.5 mEq, 2.5 mEq, Intravenous, Once, Marguerita Beards, MD No Known Allergies  Social History   Tobacco Use   Smoking status: Former    Current packs/day: 0.00    Types: Cigarettes    Start date: 03/16/2016    Quit date: 03/17/2018    Years since quitting: 4.9    Passive exposure: Past   Smokeless tobacco:  Never  Substance Use Topics   Alcohol use: Yes    Comment: rare    Family History  Problem Relation Age of Onset   Ovarian cancer Maternal Aunt    Colon cancer Cousin       Review of Systems  Constitutional: negative for fatigue and weight loss Respiratory: negative for cough and wheezing Cardiovascular: negative for chest pain, fatigue and palpitations Gastrointestinal: negative for abdominal pain and change in bowel habits Musculoskeletal:negative for myalgias Neurological: negative for gait problems and tremors Behavioral/Psych: negative for abusive relationship, depression Endocrine: negative for temperature intolerance    Genitourinary: positive for vaginal discharge.  negative for abnormal menstrual periods, genital lesions, hot flashes, sexual problems  Integument/breast: negative for breast lump, breast tenderness, nipple discharge and skin lesion(s)    Objective:       BP 109/71   Pulse 100   Wt 127 lb 14.4 oz (58 kg)   LMP 08/11/2016 (Exact Date)   BMI 24.98 kg/m  General:   Alert and no distress  Skin:   no rash or abnormalities  Lungs:   clear to auscultation bilaterally  Heart:   regular rate and rhythm, S1, S2 normal, no murmur, click, rub or gallop  Breasts:   normal without suspicious masses, skin or nipple changes or axillary nodes  Abdomen:  normal findings: no organomegaly, soft, non-tender and no hernia  Pelvis:  External genitalia: normal general appearance Urinary system: urethral meatus normal and bladder without fullness, nontender Vaginal: normal without tenderness, induration or masses Cervix: absent Adnexa: normal bimanual exam Uterus: absent   Lab Review Urine pregnancy test Labs reviewed yes Radiologic studies reviewed yes  I have spent a total of 20 minutes of face-to-face time, excluding clinical staff time, reviewing notes and preparing to see patient, ordering tests and/or medications, and counseling the patient.   Assessment:     1. Encounter for annual routine gynecological examination (Primary)  2. Vaginal discharge Rx: - Cervicovaginal ancillary only( [place])  3. Screen for STD (sexually transmitted disease) Rx: - Hepatitis B Surface AntiGEN - Hepatitis C Antibody - HIV antibody (with reflex) - RPR  4. Breast cyst, left - breast ultrasound with possible aspiration ordered  5. Endometriosis determined by laparoscopy - s/p exploratory laparotomy, resection of endometriosis and hysterectomy  - clinically stable    Plan:    Education reviewed: calcium supplements, depression evaluation, low fat, low cholesterol diet, safe sex/STD prevention, self breast exams, and weight bearing exercise. Mammogram ordered. Follow up in: 1 year.    Orders Placed This Encounter  Procedures   Hepatitis B Surface AntiGEN   Hepatitis C Antibody   HIV antibody (with reflex)   RPR    Honour Schwieger  A. Clearance Coots, MD, FACOG Attending Obstetrician & Gynecologist, [facility] for [facility], [facility], [place] 02/27/2023

## 2023-02-27 NOTE — Progress Notes (Signed)
Pt presents for AEX.

## 2023-02-28 LAB — RPR: RPR Ser Ql: NONREACTIVE

## 2023-02-28 LAB — HIV ANTIBODY (ROUTINE TESTING W REFLEX): HIV Screen 4th Generation wRfx: NONREACTIVE

## 2023-02-28 LAB — HEPATITIS B SURFACE ANTIGEN: Hepatitis B Surface Ag: NEGATIVE

## 2023-02-28 LAB — HEPATITIS C ANTIBODY: Hep C Virus Ab: NONREACTIVE

## 2023-03-01 LAB — CERVICOVAGINAL ANCILLARY ONLY
Bacterial Vaginitis (gardnerella): NEGATIVE
Candida Glabrata: NEGATIVE
Candida Vaginitis: NEGATIVE
Chlamydia: NEGATIVE
Comment: NEGATIVE
Comment: NEGATIVE
Comment: NEGATIVE
Comment: NEGATIVE
Comment: NEGATIVE
Comment: NORMAL
Neisseria Gonorrhea: NEGATIVE
Trichomonas: NEGATIVE

## 2023-03-11 ENCOUNTER — Ambulatory Visit
Admission: RE | Admit: 2023-03-11 | Discharge: 2023-03-11 | Disposition: A | Discharge status: Home / Self Care | Payer: 59 | Source: Ambulatory Visit | Attending: Obstetrics | Admitting: Obstetrics

## 2023-03-11 DIAGNOSIS — N6002 Solitary cyst of left breast: Secondary | ICD-10-CM

## 2023-03-12 ENCOUNTER — Encounter: Payer: Self-pay | Admitting: Physical Medicine and Rehabilitation

## 2023-03-12 ENCOUNTER — Encounter: Payer: 59 | Attending: Physical Medicine and Rehabilitation | Admitting: Physical Medicine and Rehabilitation

## 2023-03-12 VITALS — BP 122/86 | HR 87 | Ht 60.0 in | Wt 123.6 lb

## 2023-03-12 DIAGNOSIS — R1013 Epigastric pain: Secondary | ICD-10-CM | POA: Diagnosis present

## 2023-03-12 DIAGNOSIS — K5903 Drug induced constipation: Secondary | ICD-10-CM | POA: Diagnosis present

## 2023-03-12 DIAGNOSIS — F32A Depression, unspecified: Secondary | ICD-10-CM | POA: Insufficient documentation

## 2023-03-12 DIAGNOSIS — G8929 Other chronic pain: Secondary | ICD-10-CM | POA: Insufficient documentation

## 2023-03-12 MED ORDER — OXYCODONE-ACETAMINOPHEN 10-325 MG PO TABS: 1.0000 | ORAL_TABLET | Freq: Every day | ORAL | 0 refills | Status: DC | PRN

## 2023-03-12 NOTE — Patient Instructions (Signed)
Constipation:  -Provided list of following foods that help with constipation and highlighted a few: 1) prunes- contain high amounts of fiber.  2) apples- has a form of dietary fiber called pectin that accelerates stool movement and increases beneficial gut bacteria 3) pears- in addition to fiber, also high in fructose and sorbitol which have laxative effect 4) figs- contain an enzyme ficin which helps to speed colonic transit 5) kiwis- contain an enzyme actinidin that improves gut motility and reduces constipation 6) oranges- rich in pectin (like apples) 7) grapefruits- contain a flavanol naringenin which has a laxative effect 8) vegetables- rich in fiber and also great sources of folate, vitamin C, and K 9) artichoke- high in inulin, prebiotic great for the microbiome 10) chicory- increases stool frequency and softness (can be added to coffee) 11) rhubarb- laxative effect 12) sweet potato- high fiber 13) beans, peas, and lentils- contain both soluble and insoluble fiber 14) chia seeds- improves intestinal health and gut flora 15) flaxseeds- laxative effect 16) whole grain rye bread- high in fiber 17) oat bran- high in soluble and insoluble fiber 18) kefir- softens stools -recommended to try at least one of these foods every day.  -drink 6-8 glasses of water per day -walk regularly, especially after meals.      

## 2023-03-12 NOTE — Progress Notes (Signed)
 Subjective:    Patient ID: Christine Brooks, female    DOB: 12-20-1980, 43 y.o.   MRN: [id number]  HPI  Christine Brooks is a 43 year old woman who presents for follow-up of chronic abdominal pain.  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  -she needs a note for her surgery -she is scheduled for surgery in September.  -she was diagnosed with endometriosis. -it took a long time for her to be diagnosed -she was taking birth control to stop the spread of the endometriosis and she is not sure whether it helped. -on her last cystoscopy she had no lesions.  -she was taking ibuprofen and oxycodone. She takes both together. She takes this combination about one per day. She tries not to take this.  -the pain feels sharp, like contractions.  -she has MRI scheduled -she has 2 children and cannot have any more due to her partial hysterectomy.  -has not been sexually active for years due to the pain. -it just came up on her all of a sudden, she had never even heard of endometriosis.  -her goal is pain control -she has a lot of scar tissue  2) Depression -she developed depression due to her pain and how it is effecting her quality of life. -she finds that now her mind is in a better place since she gave her life to [place] -work is a stressful environment for her but they do not feel that her work will accommodate her  3) Constipation -she gets this from her pain medication and has to take a laxative  Pain Inventory Average Pain 9 Pain Right Now 0 My pain is intermittent, sharp, and stabbing  In the last 24 hours, has pain interfered with the following? General activity 0 Relation  with others 0 Enjoyment of life 0 What TIME of day is your pain at its worst? varies Sleep (in general) Fair  Pain is worse with: unsure Pain improves with: rest and medication Relief from Meds: 10      Family History  Problem Relation Age of Onset   Ovarian cancer Maternal Aunt    Colon cancer Cousin    Social History   Socioeconomic History   Marital status: Single    Spouse name: Not on file   Number of children: 2   Years of education: Not on file   Highest education level: Some college, no degree  Occupational History   Not on file  Tobacco Use   Smoking status: Former    Current packs/day: 0.00    Types: Cigarettes    Start date: 03/16/2016    Quit date: 03/17/2018    Years since quitting: 4.9    Passive exposure: Past   Smokeless tobacco: Never  Vaping Use   Vaping status: Never Used  Substance and Sexual Activity   Alcohol use: Yes    Comment: rare   Drug use: Never   Sexual activity: Not Currently    Partners: Male    Birth control/protection: Surgical    Comment: partial hysterectomy  Other Topics Concern   Not on file  Social History Narrative   Not  on file   Social Drivers of Health   Financial Resource Strain: Not on file  Food Insecurity: Not on file  Transportation Needs: No Transportation Needs (05/19/2020)   PRAPARE - Administrator, Civil Service (Medical): No    Lack of Transportation (Non-Medical): No  Physical Activity: Not on file  Stress: Not on file  Social Connections: Not on file   Past Surgical History:  Procedure Laterality Date   BLADDER SURGERY  09/2021   Endometriosis   CESAREAN SECTION  1999;  07/ 2009   CYSTOSCOPY N/A 03/21/2020   Procedure: CYSTOSCOPY with biopsy;  Surgeon: Marguerita Beards, MD;  Location: [facility];  Service: Gynecology;  Laterality: N/A;  Total procedure time 1 hour   CYSTOSCOPY WITH URETHRAL DILATATION Right 05/2018   W/  TURBT   (right ureteral stricture)    DIAGNOSTIC LAPAROSCOPY  12/2016   DILATION AND CURETTAGE OF UTERUS  yrs ago   HEMICOLECTOMY  02/2017   fews days post op TAH for bowel obstruction   TOTAL ABDOMINAL HYSTERECTOMY  02/2017   Past Medical History:  Diagnosis Date   Bladder mass    Dysuria    Endometriosis    Endometriosis, bladder    Hematuria    History of 2019 novel coronavirus disease (COVID-19) 08/24/2019   positive covid test result in epic, per pt mild symptoms that resolved   History of small bowel obstruction 02/2017   2019 post op TAH  w/ surgical bowel resection   Lower urinary tract symptoms (LUTS)    Nephrolithiasis    per pt nonobstructive   BP 122/86   Pulse 87   Ht 5' (1.524 m)   Wt 123 lb 9.6 oz (56.1 kg)   LMP 08/11/2016 (Exact Date)   SpO2 98%   BMI 24.14 kg/m   Opioid Risk Score:   Fall Risk Score:  `1  Depression screen [facility] 2/9     03/12/2023    9:17 AM 02/04/2023    3:03 PM 12/03/2022    3:20 PM 10/01/2022    3:24 PM 07/23/2022    3:43 PM 05/28/2022    3:01 PM 04/02/2022    2:54 PM  Depression screen PHQ 2/9  Decreased Interest 0 0 0 0 0 0 0  Down, Depressed, Hopeless 0 0 0 0 0 0 0  PHQ - 2 Score 0 0 0 0 0 0 0      Review of Systems  Constitutional: Negative.   HENT: Negative.    Eyes: Negative.   Respiratory: Negative.    Cardiovascular: Negative.   Gastrointestinal:  Positive for abdominal pain.  Endocrine: Negative.   Genitourinary:  Positive for pelvic pain.  Musculoskeletal: Negative.   Skin: Negative.   Allergic/Immunologic: Negative.   Neurological: Negative.   Hematological: Negative.   All other systems reviewed and are negative.      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  -Discussed benefits of exercise in reducing pain. -Pain contact and urine sample signed previously -continue Percocet, refilled 1 tab daily prn -provided note for work -Discussed current symptoms of pain and history of pain.  -Discussed benefits of exercise in reducing pain. -Discussed following foods that may reduce pain: 1) Ginger (especially studied for arthritis)- reduce leukotriene production to decrease inflammation 2) Blueberries- high in phytonutrients that decrease inflammation 3) Salmon- marine omega-3s reduce joint swelling and pain 4) Pumpkin seeds- reduce inflammation 5) dark chocolate- reduces inflammation 6) turmeric- reduces inflammation 7) tart cherries - reduce pain and stiffness 8) extra virgin olive oil - its compound olecanthal helps to block prostaglandins  9) chili peppers- can be eaten or applied topically via capsaicin 10) mint- helpful for headache, muscle aches, joint pain, and itching 11) garlic- reduces inflammation  Link to further information on diet for chronic pain: [email or web address].[email or web address]   2)  Depression: -discussed her prior depression, she has been able to get over this.  -encouraged her religious practice which has greatly helped her.  -discussed that this has been better -discussed that she has to be in church on Sundays  3) Constipation:   -Provided list of following foods that help with constipation and highlighted a few: 1) prunes- contain high amounts of fiber.  2) apples- has a form of dietary fiber called pectin that accelerates stool movement and increases beneficial gut bacteria 3) pears- in addition to fiber, also high in fructose and sorbitol which have laxative effect 4) figs- contain an enzyme ficin which helps to speed colonic transit 5) kiwis- contain an enzyme actinidin that improves gut motility and reduces constipation 6) oranges- rich in pectin (like apples) 7) grapefruits- contain a flavanol naringenin which has a laxative effect 8) vegetables- rich in fiber and also great sources of folate, vitamin C, and K 9) artichoke- high in inulin, prebiotic great for the microbiome 10) chicory- increases stool frequency and softness (can be added to coffee) 11) rhubarb- laxative effect 12) sweet potato- high fiber 13) beans, peas, and lentils- contain both soluble and insoluble fiber 14) chia seeds- improves intestinal health and gut flora 15) flaxseeds- laxative effect 16) whole grain rye bread- high in fiber 17) oat bran- high in soluble and insoluble fiber 18) kefir- softens stools -recommended to try at least one of these foods every day.  -drink 6-8 glasses of water per day -walk regularly, especially after meals.

## 2023-03-27 ENCOUNTER — Encounter: Payer: Self-pay | Admitting: Physical Medicine and Rehabilitation

## 2023-04-01 ENCOUNTER — Encounter: Payer: Self-pay | Admitting: Registered Nurse

## 2023-04-01 ENCOUNTER — Encounter: Payer: Self-pay | Admitting: Obstetrics

## 2023-04-01 ENCOUNTER — Encounter: Payer: 59 | Attending: Physical Medicine and Rehabilitation | Admitting: Registered Nurse

## 2023-04-01 VITALS — BP 129/84 | HR 96 | Ht 60.0 in | Wt 122.0 lb

## 2023-04-01 DIAGNOSIS — G894 Chronic pain syndrome: Secondary | ICD-10-CM | POA: Insufficient documentation

## 2023-04-01 DIAGNOSIS — Z5181 Encounter for therapeutic drug level monitoring: Secondary | ICD-10-CM | POA: Insufficient documentation

## 2023-04-01 DIAGNOSIS — R102 Pelvic and perineal pain: Secondary | ICD-10-CM | POA: Diagnosis present

## 2023-04-01 DIAGNOSIS — R52 Pain, unspecified: Secondary | ICD-10-CM

## 2023-04-01 MED ORDER — OXYCODONE-ACETAMINOPHEN 10-325 MG PO TABS: 1.0000 | ORAL_TABLET | Freq: Every day | ORAL | 0 refills | Status: DC | PRN

## 2023-04-01 NOTE — Progress Notes (Unsigned)
 Subjective:    Patient ID: Christine Brooks, female    DOB: [birth year], 42 y.o.   MRN: [id number]  HPI: Christine Brooks is a 43 y.o. female who returns for follow up appointment for chronic pain and medication refill. She states her  pain is located in her pelvis ( Pelvic Pressure). She rates her pain 0. Her current exercise regime is walking and performing stretching exercises.  Christine Brooks Morphine equivalent is 15.00 MME.   Last UDs was Perormed on 02/04/2023, it was consistent.      Pain Inventory Average Pain 8 Pain Right Now 0 My pain is sharp, stabbing, and throbbing,pressure  In the last 24 hours, has pain interfered with the following? General activity 10 Relation with others 10 Enjoyment of life 10 What TIME of day is your pain at its worst? varies Sleep (in general) Fair  Pain is worse with: inactivity Pain improves with: rest and medication Relief from Meds: 10  Family History  Problem Relation Age of Onset   Ovarian cancer Maternal Aunt    Colon cancer Cousin    Social History   Socioeconomic History   Marital status: Single    Spouse name: Not on file   Number of children: 2   Years of education: Not on file   Highest education level: Some college, no degree  Occupational History   Not on file  Tobacco Use   Smoking status: Former    Current packs/day: 0.00    Types: Cigarettes    Start date: 03/16/2016    Quit date: 03/17/2018    Years since quitting: 5.0    Passive exposure: Past   Smokeless tobacco: Never  Vaping Use   Vaping status: Never Used  Substance and Sexual Activity   Alcohol use: Yes    Comment: rare   Drug use: Never   Sexual activity: Not Currently    Partners: Male    Birth control/protection: Surgical    Comment: partial hysterectomy  Other Topics Concern   Not on file  Social History Narrative   Not on file   Social Drivers of Health   Financial Resource Strain: Not on file  Food Insecurity: Not on file  Transportation  Needs: No Transportation Needs (05/19/2020)   PRAPARE - Administrator, Civil Service (Medical): No    Lack of Transportation (Non-Medical): No  Physical Activity: Not on file  Stress: Not on file  Social Connections: Not on file   Past Surgical History:  Procedure Laterality Date   BLADDER SURGERY  09/2021   Endometriosis   CESAREAN SECTION  1999;  07/ 2009   CYSTOSCOPY N/A 03/21/2020   Procedure: CYSTOSCOPY with biopsy;  Surgeon: Marguerita Beards, MD;  Location: [facility];  Service: Gynecology;  Laterality: N/A;  Total procedure time 1 hour   CYSTOSCOPY WITH URETHRAL DILATATION Right 05/2018   W/  TURBT   (right ureteral stricture)   DIAGNOSTIC LAPAROSCOPY  12/2016   DILATION AND CURETTAGE OF UTERUS  yrs ago   HEMICOLECTOMY  02/2017   fews days post op TAH for bowel obstruction   TOTAL ABDOMINAL HYSTERECTOMY  02/2017   Past Surgical History:  Procedure Laterality Date   BLADDER SURGERY  09/2021   Endometriosis   CESAREAN SECTION  1999;  07/ 2009   CYSTOSCOPY N/A 03/21/2020   Procedure: CYSTOSCOPY with biopsy;  Surgeon: Marguerita Beards, MD;  Location: [facility];  Service: Gynecology;  Laterality: N/A;  Total procedure time 1 hour   CYSTOSCOPY WITH URETHRAL DILATATION Right 05/2018   W/  TURBT   (right ureteral stricture)   DIAGNOSTIC LAPAROSCOPY  12/2016   DILATION AND CURETTAGE OF UTERUS  yrs ago   HEMICOLECTOMY  02/2017   fews days post op TAH for bowel obstruction   TOTAL ABDOMINAL HYSTERECTOMY  02/2017   Past Medical History:  Diagnosis Date   Bladder mass    Dysuria    Endometriosis    Endometriosis, bladder    Hematuria    History of 2019 novel coronavirus disease (COVID-19) 08/24/2019   positive covid test result in epic, per pt mild symptoms that resolved   History of small bowel obstruction 02/2017   2019 post op TAH  w/ surgical bowel resection   Lower urinary tract symptoms (LUTS)    Nephrolithiasis     per pt nonobstructive   LMP 08/11/2016 (Exact Date)   Opioid Risk Score:   Fall Risk Score:  `1  Depression screen [facility] 2/9     03/12/2023    9:17 AM 02/04/2023    3:03 PM 12/03/2022    3:20 PM 10/01/2022    3:24 PM 07/23/2022    3:43 PM 05/28/2022    3:01 PM 04/02/2022    2:54 PM  Depression screen PHQ 2/9  Decreased Interest 0 0 0 0 0 0 0  Down, Depressed, Hopeless 0 0 0 0 0 0 0  PHQ - 2 Score 0 0 0 0 0 0 0    Review of Systems  Gastrointestinal:  Positive for abdominal pain.  All other systems reviewed and are negative.      Objective:   Physical Exam Vitals and nursing note reviewed.  Constitutional:      Appearance: Normal appearance.  Cardiovascular:     Rate and Rhythm: Normal rate and regular rhythm.     Pulses: Normal pulses.     Heart sounds: Normal heart sounds.  Pulmonary:     Effort: Pulmonary effort is normal.     Breath sounds: Normal breath sounds.  Musculoskeletal:     Comments: Normal Muscle Bulk and Muscle Testing Reveals:  Upper Extremities: Full ROM and Muscle Strength 5/5 Lower Extremities: Full ROM and Muscle Strength 5/5 Arises from Table with ease Narrow Based  Gait     Skin:    General: Skin is warm and dry.  Neurological:     Mental Status: She is alert and oriented to person, place, and time.  Psychiatric:        Mood and Affect: Mood normal.        Behavior: Behavior normal.         Assessment & Plan:  Chronic abdominal pain secondary to endometriosis/ Pelvic Pain: S/P PR LAP,FULGURATE/EXCISE LESIONS  PR EXC TUMOR SOFT TISSUE ABDOMINAL WALL SUBQ <3CM  PR CYSTOURETHROSCOPY  PR PART REMV BLADDER,SIMPLE  LAPAROSCOPY, SURGICAL; W/FULGURATION OR EXCISION OF LESIONS OVARY,  PELVIC VISCERA, PERITONEAL SURFACE  EXCISION, TUMOR, SOFT TISSUE OF ABDOMINAL WALL, SUBCUTANEOUS; LESS  THAN 3 CM S/P on 09/28/2021.Dr Elpidio Galea.  Continue  to Monitor. 04/01/2023 2.  Chronic abdominal pain secondary to endometriosis/ Pelvic Pain: S/PSurgery on  09/28/2021, GYN following. Continue current medication regimen. Refilled : Oxycodone 10 mg/325 mg one tablet daily as needed for pain. #30. Second script sent for the following month.We will continue the opioid monitoring program, this consists of regular clinic visits, examinations, urine drug screen, pill counts as well as use of [place] Controlled Substance Reporting  system. A 12 month History has been reviewed on the [place] Controlled Substance Reporting System on 04/01/2023 3. Chronic Pain Syndrome: Continue current medication . Continue HEP as Tolerated . Continue to Monitor. 04/01/2023   F/U in 2 months

## 2023-04-03 MED ORDER — IBUPROFEN 800 MG PO TABS: 800.0000 mg | ORAL_TABLET | Freq: Three times a day (TID) | ORAL | 5 refills | Status: AC | PRN

## 2023-04-09 ENCOUNTER — Encounter: Payer: Self-pay | Admitting: Physical Medicine and Rehabilitation

## 2023-05-30 ENCOUNTER — Ambulatory Visit: Admitting: Obstetrics

## 2023-05-30 ENCOUNTER — Encounter: Payer: Self-pay | Admitting: Obstetrics

## 2023-05-30 ENCOUNTER — Other Ambulatory Visit (HOSPITAL_COMMUNITY)
Admission: RE | Admit: 2023-05-30 | Discharge: 2023-05-30 | Disposition: A | Discharge status: Home / Self Care | Source: Ambulatory Visit | Attending: Obstetrics | Admitting: Obstetrics

## 2023-05-30 VITALS — BP 121/76 | HR 87 | Ht 60.0 in | Wt 121.1 lb

## 2023-05-30 DIAGNOSIS — N898 Other specified noninflammatory disorders of vagina: Secondary | ICD-10-CM | POA: Insufficient documentation

## 2023-05-30 DIAGNOSIS — Z113 Encounter for screening for infections with a predominantly sexual mode of transmission: Secondary | ICD-10-CM

## 2023-05-30 DIAGNOSIS — Z01419 Encounter for gynecological examination (general) (routine) without abnormal findings: Secondary | ICD-10-CM | POA: Diagnosis not present

## 2023-05-30 DIAGNOSIS — Z90710 Acquired absence of both cervix and uterus: Secondary | ICD-10-CM

## 2023-05-30 DIAGNOSIS — N809 Endometriosis, unspecified: Secondary | ICD-10-CM

## 2023-05-30 DIAGNOSIS — M549 Dorsalgia, unspecified: Secondary | ICD-10-CM

## 2023-05-30 DIAGNOSIS — N3941 Urge incontinence: Secondary | ICD-10-CM

## 2023-05-30 NOTE — Progress Notes (Unsigned)
 Subjective:        Christine Brooks is a 43 y.o. female here for a routine exam.  Current complaints: Vaginal discharge and urge urinary incontinence.  Also c/o backache and joint pains.  Past Gyn history is significant for severe endometriosis which resulted in laparoscopic diagnosis and subsequent hysterectomy and resection of multiple endometriotic implants and adhesions, including implants of adjacent urinary bladder wall, in 2019.  Developed urge incontinence in 2020 and had urologic evaluation with cystoscopy and treatment with Myrbetric that helped, but she could not afford Rx.  Personal health questionnaire:  Is patient Christine Brooks, have a family history of breast and/or ovarian cancer: yes Is there a family history of uterine cancer diagnosed at age < 31, gastrointestinal cancer, urinary tract cancer, family member who is a Personnel officer syndrome-associated carrier: yes Is the patient overweight and hypertensive, family history of diabetes, personal history of gestational diabetes, preeclampsia or PCOS: no Is patient over 57, have PCOS,  family history of premature CHD under age 31, diabetes, smoke, have hypertension or peripheral artery disease:  no At any time, has a partner hit, kicked or otherwise hurt or frightened you?: no Over the past 2 weeks, have you felt down, depressed or hopeless?: no Over the past 2 weeks, have you felt little interest or pleasure in doing things?:no   Gynecologic History Patient's last menstrual period was 08/11/2016 (exact date). Contraception: status post hysterectomy Last Pap: 2019. Results were: normal Last mammogram: 2025. Results were: abnormal  Obstetric History OB History  Gravida Para Term Preterm AB Living  3 2 2  1 2   SAB IAB Ectopic Multiple Live Births   1   2    # Outcome Date GA Lbr Len/2nd Weight Sex Type Anes PTL Lv  3 IAB 06/16/14          2 Term 07/29/07    M CS-LTranv   LIV  1 Term 12/07/97 [redacted]w[redacted]d   M CS-LTranv   LIV     Past Medical History:  Diagnosis Date   Bladder mass    Dysuria    Endometriosis    Endometriosis, bladder    Hematuria    History of 2019 novel coronavirus disease (COVID-19) 08/24/2019   positive covid test result in epic, per pt mild symptoms that resolved   History of small bowel obstruction 02/2017   2019 post op TAH  w/ surgical bowel resection   Lower urinary tract symptoms (LUTS)    Nephrolithiasis    per pt nonobstructive    Past Surgical History:  Procedure Laterality Date   BLADDER SURGERY  09/2021   Endometriosis   CESAREAN SECTION  1999;  07/ 2009   CYSTOSCOPY N/A 03/21/2020   Procedure: CYSTOSCOPY with biopsy;  Surgeon: Arma Lamp, MD;  Location: [facility];  Service: Gynecology;  Laterality: N/A;  Total procedure time 1 hour   CYSTOSCOPY WITH URETHRAL DILATATION Right 05/2018   W/  TURBT   (right ureteral stricture)   DIAGNOSTIC LAPAROSCOPY  12/2016   DILATION AND CURETTAGE OF UTERUS  yrs ago   HEMICOLECTOMY  02/2017   fews days post op TAH for bowel obstruction   TOTAL ABDOMINAL HYSTERECTOMY  02/2017     Current Outpatient Medications:    ibuprofen  (ADVIL ) 800 MG tablet, Take 1 tablet (800 mg total) by mouth every 8 (eight) hours as needed., Disp: 30 tablet, Rfl: 5   oxyCODONE -acetaminophen  (PERCOCET) 10-325 MG tablet, Take 1 tablet by mouth daily as needed  for pain., Disp: 30 tablet, Rfl: 0   acetaminophen  (TYLENOL ) 325 MG tablet, Take by mouth. (Patient not taking: Reported on 05/30/2023), Disp: , Rfl:    Cholecalciferol (VITAMIN D ) 50 MCG (2000 UT) CAPS, Take 1 capsule (2,000 Units total) by mouth daily before breakfast. (Patient not taking: Reported on 05/30/2023), Disp: 30 capsule, Rfl: 11   Fluocinolone Acetonide Scalp 0.01 % OIL, Apply topically at bedtime. (Patient not taking: Reported on 05/30/2023), Disp: , Rfl:    norethindrone  (AYGESTIN ) 5 MG tablet, Take 1 tablet (5 mg total) by mouth daily. (Patient not taking: Reported  on 05/30/2023), Disp: 30 tablet, Rfl: 11   terconazole  (TERAZOL 3 ) 0.8 % vaginal cream, Place 1 applicator vaginally at bedtime. (Patient not taking: Reported on 05/30/2023), Disp: 20 g, Rfl: 0   Vibegron (GEMTESA) 75 MG TABS, TAKE 1 TABLET BY MOUTH EVERY DAY (Patient not taking: Reported on 05/30/2023), Disp: 30 tablet, Rfl: 5  Current Facility-Administered Medications:    sodium bicarbonate  4.2 % injection 2.5 mEq, 2.5 mEq, Intravenous, Once, Arma Lamp, MD No Known Allergies  Social History   Tobacco Use   Smoking status: Former    Current packs/day: 0.00    Types: Cigarettes    Start date: 03/16/2016    Quit date: 03/17/2018    Years since quitting: 5.2    Passive exposure: Past   Smokeless tobacco: Never  Substance Use Topics   Alcohol use: Yes    Comment: rare    Family History  Problem Relation Age of Onset   Ovarian cancer Maternal Aunt    Colon cancer Cousin       Review of Systems  Constitutional: negative for fatigue and weight loss Respiratory: negative for cough and wheezing Cardiovascular: negative for chest pain, fatigue and palpitations Gastrointestinal: negative for abdominal pain and change in bowel habits Musculoskeletal: positive for myalgias Neurological: negative for gait problems and tremors Behavioral/Psych: negative for abusive relationship, depression Endocrine: negative for temperature intolerance    Genitourinary: positive for vaginal discharge and urge urinary incontinence.  negative for abnormal menstrual periods, genital lesions, hot flashes, sexual problems and vaginal discharge Integument/breast: negative for breast lump, breast tenderness, nipple discharge and skin lesion(s)    Objective:       BP 121/76   Pulse 87   Ht 5' (1.524 m)   Wt 121 lb 1.6 oz (54.9 kg)   LMP 08/11/2016 (Exact Date)   BMI 23.65 kg/m  General:   Alert and no distress  Skin:   no rash or abnormalities  Lungs:   clear to auscultation bilaterally   Heart:   regular rate and rhythm, S1, S2 normal, no murmur, click, rub or gallop  Breasts:   normal without suspicious masses, skin or nipple changes or axillary nodes  Abdomen:  normal findings: no organomegaly, soft, non-tender and no hernia  Pelvis:  External genitalia: normal general appearance Urinary system: urethral meatus normal and bladder without fullness, nontender Vaginal: normal without tenderness, induration or masses Cervix: absent Adnexa: normal bimanual exam Uterus: absent   Lab Review Urine pregnancy test Labs reviewed yes Radiologic studies reviewed yes  Assessment:  1. Encounter for annual routine gynecological examination (Primary)  2. Vaginal discharge Rx: - Cervicovaginal ancillary only( [place])  3. Screening for STD (sexually transmitted disease) Rx: - HIV antibody (with reflex) - RPR - Hepatitis C Antibody - Hepatitis B Surface AntiGEN  4. Endometriosis determined by laparoscopy  5. Urge incontinence Rx: - Ambulatory referral to Urogynecology  6.  Backache without radiation Rx: - AMB referral to orthopedics  7. H/O total hysterectomy - had severe dysmenorrhea and pelvic pain with endometriosis     Plan:    Education reviewed: calcium supplements, depression evaluation, low fat, low cholesterol diet, safe sex/STD prevention, self breast exams, and weight bearing exercise. Mammogram ordered. Follow up in: 3 months.   No orders of the defined types were placed in this encounter.  Orders Placed This Encounter  Procedures   HIV antibody (with reflex)   RPR   Hepatitis C Antibody   Hepatitis B Surface AntiGEN   AMB referral to orthopedics    Referral Priority:   Routine    Referral Type:   Consultation    Number of Visits Requested:   1   Ambulatory referral to Urogynecology    Referral Priority:   Routine    Referral Type:   Consultation    Referral Reason:   Specialty Services Required    Requested Specialty:   Urology     Number of Visits Requested:   1    Gabrielle Joiner, MD, FACOG Attending Obstetrician & Gynecologist, [facility] for [facility], [facility], [place] 05/30/2023

## 2023-05-31 LAB — CERVICOVAGINAL ANCILLARY ONLY
Bacterial Vaginitis (gardnerella): NEGATIVE
Candida Glabrata: NEGATIVE
Candida Vaginitis: NEGATIVE
Chlamydia: NEGATIVE
Comment: NEGATIVE
Comment: NEGATIVE
Comment: NEGATIVE
Comment: NEGATIVE
Comment: NEGATIVE
Comment: NORMAL
Neisseria Gonorrhea: NEGATIVE
Trichomonas: NEGATIVE

## 2023-05-31 LAB — HEPATITIS B SURFACE ANTIGEN: Hepatitis B Surface Ag: NEGATIVE

## 2023-05-31 LAB — HIV ANTIBODY (ROUTINE TESTING W REFLEX): HIV Screen 4th Generation wRfx: NONREACTIVE

## 2023-05-31 LAB — HEPATITIS C ANTIBODY: Hep C Virus Ab: NONREACTIVE

## 2023-05-31 LAB — RPR: RPR Ser Ql: NONREACTIVE

## 2023-06-03 ENCOUNTER — Encounter: Attending: Physical Medicine and Rehabilitation | Admitting: Registered Nurse

## 2023-06-03 ENCOUNTER — Encounter: Payer: Self-pay | Admitting: Registered Nurse

## 2023-06-03 VITALS — BP 104/72 | HR 88 | Ht 60.0 in | Wt 117.4 lb

## 2023-06-03 DIAGNOSIS — M545 Low back pain, unspecified: Secondary | ICD-10-CM | POA: Insufficient documentation

## 2023-06-03 DIAGNOSIS — R102 Pelvic and perineal pain: Secondary | ICD-10-CM | POA: Diagnosis present

## 2023-06-03 DIAGNOSIS — M542 Cervicalgia: Secondary | ICD-10-CM | POA: Diagnosis present

## 2023-06-03 DIAGNOSIS — G894 Chronic pain syndrome: Secondary | ICD-10-CM | POA: Insufficient documentation

## 2023-06-03 DIAGNOSIS — G8929 Other chronic pain: Secondary | ICD-10-CM | POA: Diagnosis present

## 2023-06-03 DIAGNOSIS — Z79891 Long term (current) use of opiate analgesic: Secondary | ICD-10-CM | POA: Insufficient documentation

## 2023-06-03 DIAGNOSIS — Z5181 Encounter for therapeutic drug level monitoring: Secondary | ICD-10-CM | POA: Insufficient documentation

## 2023-06-03 MED ORDER — OXYCODONE-ACETAMINOPHEN 10-325 MG PO TABS: 1.0000 | ORAL_TABLET | Freq: Every day | ORAL | 0 refills | Status: DC | PRN

## 2023-06-03 NOTE — Progress Notes (Signed)
 Subjective:    Patient ID: Christine Brooks, female    DOB: November 26, 1980, 43 y.o.   MRN: [id number]  HPI: Christine Brooks is a 43 y.o. female who returns for follow up appointment for chronic pain and medication refill. She states her pain is located in her neck and lower back, she reports her PCP placed a referral to Ortho. She  rates her pain 0, on health and history form. Her current exercise regime is walking and performing stretching exercises.   Ms. Bo  Morphine equivalent is 15.00 MME.   UDS ordered today.    Pain Inventory Average Pain 10 Pain Right Now 0 My pain is sharp and stabbing  In the last 24 hours, has pain interfered with the following? General activity 10 Relation with others 10 Enjoyment of life 10 What TIME of day is your pain at its worst? varies Sleep (in general) Fair  Pain is worse with: lifting Pain improves with: rest and medication Relief from Meds: 10  Family History  Problem Relation Age of Onset   Ovarian cancer Maternal Aunt    Colon cancer Cousin    Social History   Socioeconomic History   Marital status: Single    Spouse name: Not on file   Number of children: 2   Years of education: Not on file   Highest education level: Some college, no degree  Occupational History   Not on file  Tobacco Use   Smoking status: Former    Current packs/day: 0.00    Types: Cigarettes    Start date: 03/16/2016    Quit date: 03/17/2018    Years since quitting: 5.2    Passive exposure: Past   Smokeless tobacco: Never  Vaping Use   Vaping status: Never Used  Substance and Sexual Activity   Alcohol use: Yes    Comment: rare   Drug use: Never   Sexual activity: Not Currently    Partners: Male    Birth control/protection: Surgical    Comment: partial hysterectomy  Other Topics Concern   Not on file  Social History Narrative   Not on file   Social Drivers of Health   Financial Resource Strain: Not on file  Food Insecurity: Not on file   Transportation Needs: No Transportation Needs (05/19/2020)   PRAPARE - Administrator, Civil Service (Medical): No    Lack of Transportation (Non-Medical): No  Physical Activity: Not on file  Stress: Not on file  Social Connections: Not on file   Past Surgical History:  Procedure Laterality Date   BLADDER SURGERY  09/2021   Endometriosis   CESAREAN SECTION  1999;  07/ 2009   CYSTOSCOPY N/A 03/21/2020   Procedure: CYSTOSCOPY with biopsy;  Surgeon: Arma Lamp, MD;  Location[facility];  Service: Gynecology;  Laterality: N/A;  Total procedure time 1 hour   CYSTOSCOPY WITH URETHRAL DILATATION Right 05/2018   W/  TURBT   (right ureteral stricture)   DIAGNOSTIC LAPAROSCOPY  12/2016   DILATION AND CURETTAGE OF UTERUS  yrs ago   HEMICOLECTOMY  02/2017   fews days post op TAH for bowel obstruction   TOTAL ABDOMINAL HYSTERECTOMY  02/2017   Past Surgical History:  Procedure Laterality Date   BLADDER SURGERY  09/2021   Endometriosis   CESAREAN SECTION  1999;  07/ 2009   CYSTOSCOPY N/A 03/21/2020   Procedure: CYSTOSCOPY with biopsy;  Surgeon: Arma Lamp, MD;  Location: [facility];  Service: Gynecology;  Laterality: N/A;  Total procedure time 1 hour   CYSTOSCOPY WITH URETHRAL DILATATION Right 05/2018   W/  TURBT   (right ureteral stricture)   DIAGNOSTIC LAPAROSCOPY  12/2016   DILATION AND CURETTAGE OF UTERUS  yrs ago   HEMICOLECTOMY  02/2017   fews days post op TAH for bowel obstruction   TOTAL ABDOMINAL HYSTERECTOMY  02/2017   Past Medical History:  Diagnosis Date   Bladder mass    Dysuria    Endometriosis    Endometriosis, bladder    Hematuria    History of 2019 novel coronavirus disease (COVID-19) 08/24/2019   positive covid test result in epic, per pt mild symptoms that resolved   History of small bowel obstruction 02/2017   2019 post op TAH  w/ surgical bowel resection   Lower urinary tract symptoms (LUTS)     Nephrolithiasis    per pt nonobstructive   BP 104/72 (BP Location: Left Arm, Patient Position: Sitting, Cuff Size: Normal)   Pulse 88   Ht 5' (1.524 m)   Wt 117 lb 6.4 oz (53.3 kg)   LMP 08/11/2016 (Exact Date)   SpO2 99%   BMI 22.93 kg/m   Opioid Risk Score:   Fall Risk Score:  `1  Depression screen [facility] 2/9     06/03/2023    9:49 AM 03/12/2023    9:17 AM 02/04/2023    3:03 PM 12/03/2022    3:20 PM 10/01/2022    3:24 PM 07/23/2022    3:43 PM 05/28/2022    3:01 PM  Depression screen PHQ 2/9  Decreased Interest 1 0 0 0 0 0 0  Down, Depressed, Hopeless 0 0 0 0 0 0 0  PHQ - 2 Score 1 0 0 0 0 0 0  Altered sleeping 1        Tired, decreased energy 1        Change in appetite 0        Feeling bad or failure about yourself  0        Trouble concentrating 1        Moving slowly or fidgety/restless 0        Suicidal thoughts 0        PHQ-9 Score 4        Difficult doing work/chores Not difficult at all            Review of Systems  Gastrointestinal:  Positive for abdominal pain.       Lower abdominal pain  Genitourinary:  Positive for pelvic pain and vaginal pain.       Right pelvic pain  Musculoskeletal:  Positive for back pain and neck pain.       Back pain, neck pain from lifting at work  All other systems reviewed and are negative.      Objective:   Physical Exam Vitals and nursing note reviewed.  Constitutional:      Appearance: Normal appearance.  Neck:     Comments: Cervical Paraspinal Tenderness: C-5-C-6 Cardiovascular:     Rate and Rhythm: Normal rate and regular rhythm.     Pulses: Normal pulses.     Heart sounds: Normal heart sounds.  Pulmonary:     Effort: Pulmonary effort is normal.     Breath sounds: Normal breath sounds.  Musculoskeletal:     Comments: Normal Muscle Bulk and Muscle Testing Reveals:  Upper Extremities: Full ROM and Muscle Strength 5/5 Lumbar Paraspinal Tenderness: L-4-L-5 Lower Extremities: Full  ROM and Muscle Strength 5/5  Arises  from table slowly Narrow Based Gait     Skin:    General: Skin is warm and dry.  Neurological:     Mental Status: She is alert and oriented to person, place, and time.  Psychiatric:        Mood and Affect: Mood normal.        Behavior: Behavior normal.         Assessment & Plan:  Chronic abdominal pain secondary to endometriosis/ Pelvic Pain: S/P PR LAP,FULGURATE/EXCISE LESIONS  PR EXC TUMOR SOFT TISSUE ABDOMINAL WALL SUBQ <3CM  PR CYSTOURETHROSCOPY  PR PART REMV BLADDER,SIMPLE  LAPAROSCOPY, SURGICAL; W/FULGURATION OR EXCISION OF LESIONS OVARY,  PELVIC VISCERA, PERITONEAL SURFACE  EXCISION, TUMOR, SOFT TISSUE OF ABDOMINAL WALL, SUBCUTANEOUS; LESS  THAN 3 CM S/P on 09/28/2021.Dr Juel Nutley.  Continue  to Monitor. 06/03/2023 2.  Chronic abdominal pain secondary to endometriosis/ Pelvic Pain: S/PSurgery on 09/28/2021, GYN following. Continue current medication regimen. Refilled : Oxycodone  10 mg/325 mg one tablet daily as needed for pain. #30. Second script sent for the following month.We will continue the opioid monitoring program, this consists of regular clinic visits, examinations, urine drug screen, pill counts as well as use of [employer]  Controlled Substance Reporting system. A 12 month History has been reviewed on the [place]  Controlled Substance Reporting System on 06/03/2023 3. Chronic Pain Syndrome: Continue current medication . Continue HEP as Tolerated . Continue to Monitor. 06/03/2023 4. Cervicalgia: Continue HEP as Tolerated. Continue to Monitor.  5. Acute Exacerbation of Chronic Low Back pain: She states her PCP placed a refrral to Ortho. Continue to Monitor,   F/U in 2 months

## 2023-06-07 LAB — TOXASSURE SELECT,+ANTIDEPR,UR

## 2023-07-31 ENCOUNTER — Encounter: Attending: Physical Medicine and Rehabilitation | Admitting: Physical Medicine and Rehabilitation

## 2023-07-31 DIAGNOSIS — R1013 Epigastric pain: Secondary | ICD-10-CM | POA: Insufficient documentation

## 2023-07-31 DIAGNOSIS — M545 Low back pain, unspecified: Secondary | ICD-10-CM | POA: Insufficient documentation

## 2023-07-31 DIAGNOSIS — R102 Pelvic and perineal pain: Secondary | ICD-10-CM | POA: Insufficient documentation

## 2023-07-31 DIAGNOSIS — Z79891 Long term (current) use of opiate analgesic: Secondary | ICD-10-CM | POA: Insufficient documentation

## 2023-07-31 DIAGNOSIS — G8929 Other chronic pain: Secondary | ICD-10-CM | POA: Diagnosis not present

## 2023-07-31 DIAGNOSIS — G894 Chronic pain syndrome: Secondary | ICD-10-CM | POA: Insufficient documentation

## 2023-07-31 DIAGNOSIS — Z5181 Encounter for therapeutic drug level monitoring: Secondary | ICD-10-CM | POA: Insufficient documentation

## 2023-07-31 NOTE — Progress Notes (Signed)
 Subjective:    Patient ID: Christine Brooks, female    DOB: 05/11/1980, 43 y.o.   MRN: [id number]  HPI  [name] is a 43 year old woman who presents for follow-up of chronic abdominal pain.  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  -she needs a note for her surgery -she is scheduled for surgery in September.  -she was diagnosed with endometriosis. -it took a long time for her to be diagnosed -she was taking birth control to stop the spread of the endometriosis and she is not sure whether it helped. -on her last cystoscopy she had no lesions.  -she was taking ibuprofen  and oxycodone . She takes both together. She takes this combination about one per day. She tries not to take this.  -the pain feels sharp, like contractions.  -she has MRI scheduled -she has 2 children and cannot have any more due to her partial hysterectomy.  -has not been sexually active for years due to the pain. -it just came up on her all of a sudden, she had never even heard of endometriosis.  -her goal is pain control -she has a lot of scar tissue  2) Depression -she developed depression due to her pain and how it is effecting her quality of life. -she finds that now her mind is in a better place since she gave her life to [place] -work is a stressful environment for her but they do not feel that her work will accommodate her  3) Constipation -she gets this from her pain medication and has to take a laxative  Pain Inventory Average Pain 9 Pain Right Now 0 My pain is  intermittent, sharp, and stabbing  In the last 24 hours, has pain interfered with the following? General activity 0 Relation with others 0 Enjoyment of life 0 What TIME of day is your pain at its worst? varies Sleep (in general) Fair  Pain is worse with: unsure Pain improves with: rest and medication Relief from Meds: 10      Family History  Problem Relation Age of Onset   Ovarian cancer Maternal Aunt    Colon cancer Cousin    Social History   Socioeconomic History   Marital status: Single    Spouse name: Not on file   Number of children: 2   Years of education: Not on file   Highest education level: Some college, no degree  Occupational History   Not on file  Tobacco Use   Smoking status: Former    Current packs/day: 0.00    Types: Cigarettes    Start date: 03/16/2016    Quit date: 03/17/2018    Years since quitting: 5.3    Passive exposure: Past   Smokeless tobacco: Never  Vaping Use   Vaping status: Never Used  Substance and Sexual Activity   Alcohol use: Yes    Comment: rare   Drug use: Never   Sexual activity: Not Currently    Partners: Male    Birth control/protection: Surgical  Comment: partial hysterectomy  Other Topics Concern   Not on file  Social History Narrative   Not on file   Social Drivers of Health   Financial Resource Strain: Not on file  Food Insecurity: Not on file  Transportation Needs: No Transportation Needs (05/19/2020)   PRAPARE - Administrator, Civil Service (Medical): No    Lack of Transportation (Non-Medical): No  Physical Activity: Not on file  Stress: Not on file  Social Connections: Not on file   Past Surgical History:  Procedure Laterality Date   BLADDER SURGERY  09/2021   Endometriosis   CESAREAN SECTION  1999;  07/ 2009   CYSTOSCOPY N/A 03/21/2020   Procedure: CYSTOSCOPY with biopsy;  Surgeon: Marilynne Rosaline SAILOR, MD;  Location: [facility];  Service: Gynecology;  Laterality: N/A;   Total procedure time 1 hour   CYSTOSCOPY WITH URETHRAL DILATATION Right 05/2018   W/  TURBT   (right ureteral stricture)   DIAGNOSTIC LAPAROSCOPY  12/2016   DILATION AND CURETTAGE OF UTERUS  yrs ago   HEMICOLECTOMY  02/2017   fews days post op TAH for bowel obstruction   TOTAL ABDOMINAL HYSTERECTOMY  02/2017   Past Medical History:  Diagnosis Date   Bladder mass    Dysuria    Endometriosis    Endometriosis, bladder    Hematuria    History of 2019 novel coronavirus disease (COVID-19) 08/24/2019   positive covid test result in epic, per pt mild symptoms that resolved   History of small bowel obstruction 02/2017   2019 post op TAH  w/ surgical bowel resection   Lower urinary tract symptoms (LUTS)    Nephrolithiasis    per pt nonobstructive   LMP 08/11/2016 (Exact Date)   Opioid Risk Score:   Fall Risk Score:  `1  Depression screen [facility] 2/9     06/03/2023    9:49 AM 03/12/2023    9:17 AM 02/04/2023    3:03 PM 12/03/2022    3:20 PM 10/01/2022    3:24 PM 07/23/2022    3:43 PM 05/28/2022    3:01 PM  Depression screen PHQ 2/9  Decreased Interest 1 0 0 0 0 0 0  Down, Depressed, Hopeless 0 0 0 0 0 0 0  PHQ - 2 Score 1 0 0 0 0 0 0  Altered sleeping 1        Tired, decreased energy 1        Change in appetite 0        Feeling bad or failure about yourself  0        Trouble concentrating 1        Moving slowly or fidgety/restless 0        Suicidal thoughts 0        PHQ-9 Score 4        Difficult doing work/chores Not difficult at all            Review of Systems  Constitutional: Negative.   HENT: Negative.    Eyes: Negative.   Respiratory: Negative.    Cardiovascular: Negative.   Gastrointestinal:  Positive for abdominal pain.  Endocrine: Negative.   Genitourinary:  Positive for pelvic pain.  Musculoskeletal: Negative.   Skin: Negative.   Allergic/Immunologic: Negative.   Neurological: Negative.   Hematological: Negative.   All other systems reviewed and are  negative.      Objective:   Physical Exam PRIOR EXAM Gen: no distress, normal appearing HEENT:  oral mucosa pink and moist, NCAT Cardio: Reg rate Chest: normal effort, normal rate of breathing Abd: soft, non-distended Ext: no edema Psych: pleasant, normal affect, bright and cheerful Skin: intact Neuro: Alert and oriented x3 Musculoskeletal: Normal ambulation    Assessment & Plan:  1) Chronic abdominal pain secondary to endometriosis -discussed that the lifting and pulling in her job causes her a lot of pain but she is doubtful that her job will be able to provide her with accommodations -discussed her disability paperwork, discussed her current pain, discussed that the past accommodations we had requested for her have worked well for her -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  -Discussed benefits of exercise in reducing pain. -Pain contact and urine sample signed previously -continue Percocet, refilled 1 tab daily prn -provided note for work -Discussed current symptoms of pain and history of pain.  -Discussed benefits of exercise in reducing pain. -Discussed following foods that may reduce pain: 1) Ginger (especially studied for arthritis)- reduce leukotriene production to decrease inflammation 2) Blueberries- high in phytonutrients that decrease inflammation 3) Salmon- marine omega-3s reduce joint swelling and pain 4) Pumpkin seeds- reduce inflammation 5) dark chocolate- reduces inflammation 6)  turmeric- reduces inflammation 7) tart cherries - reduce pain and stiffness 8) extra virgin olive oil - its compound olecanthal helps to block prostaglandins  9) chili peppers- can be eaten or applied topically via capsaicin 10) mint- helpful for headache, muscle aches, joint pain, and itching 11) garlic- reduces inflammation  Link to further information on diet for chronic pain: [email or web address]   2) Depression: -discussed her prior depression, she has been able to get over this.  -encouraged her religious practice which has greatly helped her.  -discussed that this has been better -discussed that she has to be in church on Sundays  3) Constipation:   -Provided list of following foods that help with constipation and highlighted a few: 1) prunes- contain high amounts of fiber.  2) apples- has a form of dietary fiber called pectin that accelerates stool movement and increases beneficial gut bacteria 3) pears- in addition to fiber, also high in fructose and sorbitol which have laxative effect 4) figs- contain an enzyme ficin which helps to speed colonic transit 5) kiwis- contain an enzyme actinidin that improves gut motility and reduces constipation 6) oranges- rich in pectin (like apples) 7) grapefruits- contain a flavanol naringenin which has a laxative effect 8) vegetables- rich in fiber and also great sources of folate, vitamin C, and K 9) artichoke- high in inulin, prebiotic great for the microbiome 10) chicory- increases stool frequency and softness (can be added to coffee) 11) rhubarb- laxative effect 12) sweet potato- high fiber 13) beans, peas, and lentils- contain both soluble and insoluble fiber 14) chia seeds- improves intestinal health and gut flora 15) flaxseeds- laxative effect 16) whole grain rye bread- high in fiber 17) oat bran- high in soluble and insoluble fiber 18) kefir- softens  stools -recommended to try at least one  of these foods every day.  -drink 6-8 glasses of water  per day -walk regularly, especially after meals.     5 minutes spent in discussion of her disability paperwork, discussed her current pain, discussed that the past accommodations we had requested for her have worked well for her

## 2023-08-05 ENCOUNTER — Encounter (HOSPITAL_BASED_OUTPATIENT_CLINIC_OR_DEPARTMENT_OTHER): Admitting: Registered Nurse

## 2023-08-05 ENCOUNTER — Encounter: Payer: Self-pay | Admitting: Registered Nurse

## 2023-08-05 VITALS — BP 122/83 | HR 88 | Ht 60.0 in | Wt 118.8 lb

## 2023-08-05 DIAGNOSIS — M545 Low back pain, unspecified: Secondary | ICD-10-CM

## 2023-08-05 DIAGNOSIS — R1013 Epigastric pain: Secondary | ICD-10-CM

## 2023-08-05 DIAGNOSIS — G8929 Other chronic pain: Secondary | ICD-10-CM | POA: Diagnosis present

## 2023-08-05 DIAGNOSIS — R102 Pelvic and perineal pain: Secondary | ICD-10-CM

## 2023-08-05 DIAGNOSIS — G894 Chronic pain syndrome: Secondary | ICD-10-CM | POA: Diagnosis present

## 2023-08-05 DIAGNOSIS — Z5181 Encounter for therapeutic drug level monitoring: Secondary | ICD-10-CM

## 2023-08-05 DIAGNOSIS — Z79891 Long term (current) use of opiate analgesic: Secondary | ICD-10-CM

## 2023-08-05 MED ORDER — OXYCODONE-ACETAMINOPHEN 10-325 MG PO TABS
1.0000 | ORAL_TABLET | Freq: Every day | ORAL | 0 refills | Status: DC | PRN
Start: 2023-08-05 — End: 2023-08-05

## 2023-08-05 MED ORDER — OXYCODONE-ACETAMINOPHEN 10-325 MG PO TABS: 1.0000 | ORAL_TABLET | Freq: Every day | ORAL | 0 refills | Status: DC | PRN

## 2023-08-05 NOTE — Progress Notes (Signed)
 Subjective:    Patient ID: Christine Brooks, female    DOB: 05/11/1980, 43 y.o.   MRN: [id number]  HPI: Christine Brooks is a 43 y.o. female who returns for follow up appointment for chronic pain and medication refill. She states her pain is located in her lower back and pelvic pressure. She rates her pain 0. Her current exercise regime is walking and performing stretching exercises.  Ms. Cothran Morphine equivalent is 15.00 MME.   Last UDS was Performed on 06/03/2023, it was consistent.     Pain Inventory Average Pain 9 Pain Right Now 0 My pain is sharp, dull, and stabbing  In the last 24 hours, has pain interfered with the following? General activity 8 Relation with others 8 Enjoyment of life 8 What TIME of day is your pain at its worst? varies Sleep (in general) Fair  Pain is worse with: other Pain improves with: rest and medication Relief from Meds: 10  Family History  Problem Relation Age of Onset   Ovarian cancer Maternal Aunt    Colon cancer Cousin    Social History   Socioeconomic History   Marital status: Single    Spouse name: Not on file   Number of children: 2   Years of education: Not on file   Highest education level: Some college, no degree  Occupational History   Not on file  Tobacco Use   Smoking status: Former    Current packs/day: 0.00    Types: Cigarettes    Start date: 03/16/2016    Quit date: 03/17/2018    Years since quitting: 5.3    Passive exposure: Past   Smokeless tobacco: Never  Vaping Use   Vaping status: Never Used  Substance and Sexual Activity   Alcohol use: Yes    Comment: rare   Drug use: Never   Sexual activity: Not Currently    Partners: Male    Birth control/protection: Surgical    Comment: partial hysterectomy  Other Topics Concern   Not on file  Social History Narrative   Not on file   Social Drivers of Health   Financial Resource Strain: Not on file  Food Insecurity: Not on file  Transportation Needs: No Transportation  Needs (05/19/2020)   PRAPARE - Administrator, Civil Service (Medical): No    Lack of Transportation (Non-Medical): No  Physical Activity: Not on file  Stress: Not on file  Social Connections: Not on file   Past Surgical History:  Procedure Laterality Date   BLADDER SURGERY  09/2021   Endometriosis   CESAREAN SECTION  1999;  07/ 2009   CYSTOSCOPY N/A 03/21/2020   Procedure: CYSTOSCOPY with biopsy;  Surgeon: Marilynne Rosaline SAILOR, MD;  Location: [facility];  Service: Gynecology;  Laterality: N/A;  Total procedure time 1 hour   CYSTOSCOPY WITH URETHRAL DILATATION Right 05/2018   W/  TURBT   (right ureteral stricture)   DIAGNOSTIC LAPAROSCOPY  12/2016   DILATION AND CURETTAGE OF UTERUS  yrs ago   HEMICOLECTOMY  02/2017   fews days post op TAH for bowel obstruction   TOTAL ABDOMINAL HYSTERECTOMY  02/2017   Past Surgical History:  Procedure Laterality Date   BLADDER SURGERY  09/2021   Endometriosis   CESAREAN SECTION  1999;  07/ 2009   CYSTOSCOPY N/A 03/21/2020   Procedure: CYSTOSCOPY with biopsy;  Surgeon: Marilynne Rosaline SAILOR, MD;  Location: [facility];  Service: Gynecology;  Laterality: N/A;  Total  procedure time 1 hour   CYSTOSCOPY WITH URETHRAL DILATATION Right 05/2018   W/  TURBT   (right ureteral stricture)   DIAGNOSTIC LAPAROSCOPY  12/2016   DILATION AND CURETTAGE OF UTERUS  yrs ago   HEMICOLECTOMY  02/2017   fews days post op TAH for bowel obstruction   TOTAL ABDOMINAL HYSTERECTOMY  02/2017   Past Medical History:  Diagnosis Date   Bladder mass    Dysuria    Endometriosis    Endometriosis, bladder    Hematuria    History of 2019 novel coronavirus disease (COVID-19) 08/24/2019   positive covid test result in epic, per pt mild symptoms that resolved   History of small bowel obstruction 02/2017   2019 post op TAH  w/ surgical bowel resection   Lower urinary tract symptoms (LUTS)    Nephrolithiasis    per pt nonobstructive    BP 122/83   Pulse 88   Ht 5' (1.524 m)   Wt 118 lb 12.8 oz (53.9 kg)   LMP 08/11/2016 (Exact Date)   SpO2 98%   BMI 23.20 kg/m   Opioid Risk Score:   Fall Risk Score:  `1  Depression screen [facility] 2/9     08/05/2023   11:07 AM 06/03/2023    9:49 AM 03/12/2023    9:17 AM 02/04/2023    3:03 PM 12/03/2022    3:20 PM 10/01/2022    3:24 PM 07/23/2022    3:43 PM  Depression screen PHQ 2/9  Decreased Interest 0 1 0 0 0 0 0  Down, Depressed, Hopeless 0 0 0 0 0 0 0  PHQ - 2 Score 0 1 0 0 0 0 0  Altered sleeping  1       Tired, decreased energy  1       Change in appetite  0       Feeling bad or failure about yourself   0       Trouble concentrating  1       Moving slowly or fidgety/restless  0       Suicidal thoughts  0       PHQ-9 Score  4       Difficult doing work/chores  Not difficult at all          Review of Systems  Musculoskeletal:  Positive for back pain.  All other systems reviewed and are negative.      Objective:   Physical Exam Vitals and nursing note reviewed.  Constitutional:      Appearance: Normal appearance.  Cardiovascular:     Rate and Rhythm: Normal rate and regular rhythm.  Pulmonary:     Effort: Pulmonary effort is normal.     Breath sounds: Normal breath sounds.  Musculoskeletal:     Comments: Normal Muscle Bulk and Muscle Testing Reveals:  Upper Extremities:Full  ROM and Muscle Strength 5/5  Lumbar Paraspinal Tenderness: L-4-L-5 Lower Extremities: Full ROM and Muscle Strength 5/5 Arises from Table with ease Narrow Based  Gait     Skin:    General: Skin is warm and dry.  Neurological:     Mental Status: She is alert.  Psychiatric:        Mood and Affect: Mood normal.        Behavior: Behavior normal.          Assessment & Plan:  Chronic abdominal pain secondary to endometriosis/ Pelvic Pain: S/P PR LAP,FULGURATE/EXCISE LESIONS  PR EXC TUMOR SOFT TISSUE ABDOMINAL WALL  SUBQ <3CM  PR CYSTOURETHROSCOPY  PR PART REMV BLADDER,SIMPLE   LAPAROSCOPY, SURGICAL; W/FULGURATION OR EXCISION OF LESIONS OVARY,  PELVIC VISCERA, PERITONEAL SURFACE  EXCISION, TUMOR, SOFT TISSUE OF ABDOMINAL WALL, SUBCUTANEOUS; LESS  THAN 3 CM S/P on 09/28/2021.Dr Chuck.  Continue  to Monitor. 08/05/2023 2.  Chronic abdominal pain secondary to endometriosis/ Pelvic Pain: S/PSurgery on 09/28/2021, GYN following. Continue current medication regimen. Refilled : Oxycodone  10 mg/325 mg one tablet daily as needed for pain. #30. Second script sent for the following month.We will continue the opioid monitoring program, this consists of regular clinic visits, examinations, urine drug screen, pill counts as well as use of [place]  Controlled Substance Reporting system. A 12 month History has been reviewed on the [place]  Controlled Substance Reporting System on 08/05/2023 3. Chronic Pain Syndrome: Continue current medication . Continue HEP as Tolerated . Continue to Monitor. 08/05/2023 4. Chronic Low Back Pain without Sciatica: Ms. Jobst states she is waiting on appointment with Ortho, she will keep this provider updated. Continue to Monitor.  F/U in 2 months

## 2023-08-09 ENCOUNTER — Other Ambulatory Visit (HOSPITAL_COMMUNITY)
Admission: RE | Admit: 2023-08-09 | Discharge: 2023-08-09 | Disposition: A | Discharge status: Home / Self Care | Source: Ambulatory Visit | Attending: Obstetrics | Admitting: Obstetrics

## 2023-08-09 ENCOUNTER — Encounter: Payer: Self-pay | Admitting: Obstetrics

## 2023-08-09 ENCOUNTER — Ambulatory Visit (INDEPENDENT_AMBULATORY_CARE_PROVIDER_SITE_OTHER): Admitting: Obstetrics

## 2023-08-09 VITALS — BP 124/68 | HR 89 | Ht 60.0 in | Wt 121.6 lb

## 2023-08-09 DIAGNOSIS — N898 Other specified noninflammatory disorders of vagina: Secondary | ICD-10-CM

## 2023-08-09 DIAGNOSIS — A630 Anogenital (venereal) warts: Secondary | ICD-10-CM

## 2023-08-09 NOTE — Progress Notes (Signed)
 Pt presents for swab std testing, will reschedule for blood std. Pt states that she has a bump or something that has popped up on her vaginal area.

## 2023-08-09 NOTE — Progress Notes (Signed)
 Patient ID: Christine Brooks, female   DOB: 12-13-1980, 43 y.o.   MRN: [id number]  HPI Christine Brooks is a 43 y.o. female.  Complains of bump in mons pubis. HPI  Past Medical History:  Diagnosis Date   Bladder mass    Dysuria    Endometriosis    Endometriosis, bladder    Hematuria    History of 2019 novel coronavirus disease (COVID-19) 08/24/2019   positive covid test result in epic, per pt mild symptoms that resolved   History of small bowel obstruction 02/2017   2019 post op TAH  w/ surgical bowel resection   Lower urinary tract symptoms (LUTS)    Nephrolithiasis    per pt nonobstructive    Past Surgical History:  Procedure Laterality Date   BLADDER SURGERY  09/2021   Endometriosis   CESAREAN SECTION  1999;  07/ 2009   CYSTOSCOPY N/A 03/21/2020   Procedure: CYSTOSCOPY with biopsy;  Surgeon: Marilynne Rosaline SAILOR, MD;  Location: [facility];  Service: Gynecology;  Laterality: N/A;  Total procedure time 1 hour   CYSTOSCOPY WITH URETHRAL DILATATION Right 05/2018   W/  TURBT   (right ureteral stricture)   DIAGNOSTIC LAPAROSCOPY  12/2016   DILATION AND CURETTAGE OF UTERUS  yrs ago   HEMICOLECTOMY  02/2017   fews days post op TAH for bowel obstruction   TOTAL ABDOMINAL HYSTERECTOMY  02/2017    Family History  Problem Relation Age of Onset   Ovarian cancer Maternal Aunt    Colon cancer Cousin     Social History Social History   Tobacco Use   Smoking status: Former    Current packs/day: 0.00    Types: Cigarettes    Start date: 03/16/2016    Quit date: 03/17/2018    Years since quitting: 5.4    Passive exposure: Past   Smokeless tobacco: Never  Vaping Use   Vaping status: Never Used  Substance Use Topics   Alcohol use: Yes    Comment: rare   Drug use: Never    No Known Allergies  Current Outpatient Medications  Medication Sig Dispense Refill   ibuprofen  (ADVIL ) 800 MG tablet Take 1 tablet (800 mg total) by mouth every 8 (eight) hours as needed. 30  tablet 5   oxyCODONE -acetaminophen  (PERCOCET) 10-325 MG tablet Take 1 tablet by mouth daily as needed for pain. 30 tablet 0   acetaminophen  (TYLENOL ) 325 MG tablet Take by mouth. (Patient not taking: Reported on 08/09/2023)     Cholecalciferol (VITAMIN D ) 50 MCG (2000 UT) CAPS Take 1 capsule (2,000 Units total) by mouth daily before breakfast. (Patient not taking: Reported on 08/09/2023) 30 capsule 11   Fluocinolone Acetonide Scalp 0.01 % OIL Apply topically at bedtime. (Patient not taking: Reported on 08/09/2023)     norethindrone  (AYGESTIN ) 5 MG tablet Take 1 tablet (5 mg total) by mouth daily. (Patient not taking: Reported on 08/09/2023) 30 tablet 11   terconazole  (TERAZOL 3 ) 0.8 % vaginal cream Place 1 applicator vaginally at bedtime. (Patient not taking: Reported on 08/09/2023) 20 g 0   Vibegron (GEMTESA) 75 MG TABS TAKE 1 TABLET BY MOUTH EVERY DAY (Patient not taking: Reported on 08/09/2023) 30 tablet 5   Current Facility-Administered Medications  Medication Dose Route Frequency Provider Last Rate Last Admin   sodium bicarbonate  4.2 % injection 2.5 mEq  2.5 mEq Intravenous Once Marilynne Rosaline SAILOR, MD        Review of Systems Review of Systems Constitutional: negative for  fatigue and weight loss Respiratory: negative for cough and wheezing Cardiovascular: negative for chest pain, fatigue and palpitations Gastrointestinal: negative for abdominal pain and change in bowel habits Genitourinary:POSITIVE FOR small BUMP IN MONS PUBIS Integument/breast: negative for nipple discharge Musculoskeletal:negative for myalgias Neurological: negative for gait problems and tremors Behavioral/Psych: negative for abusive relationship, depression Endocrine: negative for temperature intolerance      Blood pressure 124/68, pulse 89, height 5' (1.524 m), weight 121 lb 9.6 oz (55.2 kg), last menstrual period 08/11/2016.  Physical Exam Physical Exam General:   Alert and no distress  Skin:   no rash or  abnormalities  Lungs:   clear to auscultation bilaterally  Heart:   regular rate and rhythm, S1, S2 normal, no murmur, click, rub or gallop  Breasts:   normal without suspicious masses, skin or nipple changes or axillary nodes  Abdomen:  normal findings: no organomegaly, soft, non-tender and no hernia  Pelvis:  External genitalia: small condyloma in mons pubis Urinary system: urethral meatus normal and bladder without fullness, nontender Vaginal: normal without tenderness, induration or masses Cervix: normal appearance Adnexa: normal bimanual exam Uterus: anteverted and non-tender, normal size    I have spent a total of 20 minutes of face-to-face time, excluding clinical staff time, reviewing notes and preparing to see patient, ordering tests and/or medications, and counseling the patient.   Data Reviewed Wet Prep and Cultures Pap Smear  Assessment     1. Condyloma acuminata (Primary) - will follow clinically.  TCA 80% application prn discussed.  2. Vaginal discharge Rx: - Cervicovaginal ancillary only( [place])     Plan   Follow up in 3 months  CARLIN RONAL CENTERS, MD, FACOG Attending Obstetrician & Gynecologist, [facility] for [facility], [facility], [place] 08/09/2023

## 2023-08-12 ENCOUNTER — Other Ambulatory Visit: Payer: Self-pay

## 2023-08-12 MED ORDER — FLUCONAZOLE 150 MG PO TABS: 150.0000 mg | ORAL_TABLET | Freq: Once | ORAL | 0 refills | Status: AC

## 2023-08-12 MED ORDER — METRONIDAZOLE 0.75 % VA GEL: 1.0000 | Freq: Every day | VAGINAL | 0 refills | Status: AC

## 2023-08-13 LAB — CERVICOVAGINAL ANCILLARY ONLY
Bacterial Vaginitis (gardnerella): NEGATIVE
Candida Glabrata: NEGATIVE
Candida Vaginitis: NEGATIVE
Chlamydia: NEGATIVE
Comment: NEGATIVE
Comment: NEGATIVE
Comment: NEGATIVE
Comment: NEGATIVE
Comment: NEGATIVE
Comment: NORMAL
Neisseria Gonorrhea: NEGATIVE
Trichomonas: NEGATIVE

## 2023-08-20 ENCOUNTER — Encounter: Payer: Self-pay | Admitting: Physical Medicine and Rehabilitation

## 2023-08-27 ENCOUNTER — Encounter: Payer: Self-pay | Admitting: Physical Medicine and Rehabilitation

## 2023-09-24 ENCOUNTER — Encounter: Payer: Self-pay | Admitting: Radiology

## 2023-10-02 NOTE — Progress Notes (Incomplete)
 Chief Complaint: No chief complaint on file.   History of Present Illness:  Christine Brooks is a 43 y.o. female who is seen in consultation from Patient, No Pcp Per for evaluation of lower urinary tract symptomatology.  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   Past Medical History:  Past Medical History:  Diagnosis Date   Bladder mass    Dysuria    Endometriosis    Endometriosis, bladder    Hematuria    History of 2019 novel coronavirus disease (COVID-19) 08/24/2019   positive covid test result in epic, per pt mild symptoms that resolved   History of small bowel obstruction 02/2017   2019 post op TAH  w/ surgical bowel resection   Lower urinary tract symptoms (LUTS)    Nephrolithiasis    per pt nonobstructive    Past Surgical History:  Past Surgical History:  Procedure Laterality Date   BLADDER SURGERY  09/2021   Endometriosis   CESAREAN SECTION  1999;  07/ 2009   CYSTOSCOPY N/A 03/21/2020   Procedure: CYSTOSCOPY with biopsy;  Surgeon: Marilynne Rosaline SAILOR, MD;  Location: [facility];  Service: Gynecology;  Laterality: N/A;  Total procedure time 1 hour   CYSTOSCOPY WITH URETHRAL DILATATION Right 05/2018   W/  TURBT   (right ureteral stricture)   DIAGNOSTIC LAPAROSCOPY  12/2016   DILATION AND CURETTAGE OF UTERUS  yrs ago   HEMICOLECTOMY  02/2017   fews days post op TAH for bowel obstruction   TOTAL ABDOMINAL HYSTERECTOMY  02/2017    Allergies:  No Known Allergies  Family History:  Family History  Problem Relation Age of Onset   Ovarian cancer Maternal Aunt    Colon cancer Cousin     Social History:  Social History   Tobacco Use   Smoking status: Former    Current packs/day: 0.00    Types: Cigarettes    Start date: 03/16/2016    Quit date: 03/17/2018    Years since quitting: 5.5    Passive exposure: Past   Smokeless tobacco: Never  Vaping Use   Vaping status: Never Used  Substance  Use Topics   Alcohol use: Yes    Comment: rare   Drug use: Never    Review of symptoms:  Constitutional:  Negative for unexplained weight loss, night sweats, fever, chills ENT:  Negative for nose bleeds, sinus pain, painful swallowing CV:  Negative for chest pain, shortness of breath, exercise intolerance, palpitations, loss of consciousness Resp:  Negative for cough, wheezing, shortness of breath GI:  Negative for nausea, vomiting, diarrhea, bloody stools GU:  Positives noted in HPI; otherwise negative for gross hematuria, dysuria, urinary incontinence Neuro:  Negative for seizures, poor balance, limb weakness, slurred speech Psych:  Negative for lack of energy, depression, anxiety Endocrine:  Negative for polydipsia, polyuria, symptoms of hypoglycemia (dizziness, hunger, sweating) Hematologic:  Negative for anemia, purpura, petechia, prolonged or excessive bleeding, use of anticoagulants  Allergic:  Negative for difficulty breathing or choking as a result of exposure to anything; no shellfish allergy; no allergic response (rash/itch) to materials, foods  Physical exam: LMP 08/11/2016 (Exact Date)  GENERAL APPEARANCE:  Well appearing, well developed, well nourished, NAD HEENT: Atraumatic, Normocephalic, oropharynx clear. NECK: Supple without lymphadenopathy or thyromegaly. LUNGS: Clear to auscultation bilaterally. HEART: Regular Rate and Rhythm without murmurs, gallops, or rubs. ABDOMEN: Soft, non-tender, No Masses. EXTREMITIES: Moves all extremities well.  Without clubbing, cyanosis, or edema. NEUROLOGIC:  Alert and oriented x 3, normal gait, CN II-XII grossly intact.  MENTAL STATUS:  Appropriate. BACK:  Non-tender to palpation.  No CVAT SKIN:  Warm, dry and intact.    Results: No results found for this or any previous visit (from the past 24 hours).  I have reviewed prior patient's records  I have reviewed referring/prior physicians records  I have reviewed urinalysis  I  have reviewed prior urine cultures  I reviewed prior imaging studies  Assessment: No diagnosis found.   Plan: ***

## 2023-10-07 ENCOUNTER — Ambulatory Visit: Admitting: Urology

## 2023-10-07 DIAGNOSIS — N3941 Urge incontinence: Secondary | ICD-10-CM

## 2023-10-07 DIAGNOSIS — R3915 Urgency of urination: Secondary | ICD-10-CM

## 2023-10-14 ENCOUNTER — Encounter: Admitting: Registered Nurse

## 2023-10-16 ENCOUNTER — Ambulatory Visit: Admitting: Registered Nurse

## 2023-10-20 NOTE — Progress Notes (Signed)
 Chief Complaint: No chief complaint on file.   History of Present Illness:  Christine Brooks is a 43 y.o. female who is seen in consultation from Patient, No Pcp Per for evaluation of lower urinary tract symptomatology.  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   Past Medical History:  Past Medical History:  Diagnosis Date   Bladder mass    Dysuria    Endometriosis    Endometriosis, bladder    Hematuria    History of 2019 novel coronavirus disease (COVID-19) 08/24/2019   positive covid test result in epic, per pt mild symptoms that resolved   History of small bowel obstruction 02/2017   2019 post op TAH  w/ surgical bowel resection   Lower urinary tract symptoms (LUTS)    Nephrolithiasis    per pt nonobstructive    Past Surgical History:  Past Surgical History:  Procedure Laterality Date   BLADDER SURGERY  09/2021   Endometriosis   CESAREAN SECTION  1999;  07/ 2009   CYSTOSCOPY N/A 03/21/2020   Procedure: CYSTOSCOPY with biopsy;  Surgeon: Marilynne Rosaline SAILOR, MD;  Location: [facility];  Service: Gynecology;  Laterality: N/A;  Total procedure time 1 hour   CYSTOSCOPY WITH URETHRAL DILATATION Right 05/2018   W/  TURBT   (right ureteral stricture)   DIAGNOSTIC LAPAROSCOPY  12/2016   DILATION AND CURETTAGE OF UTERUS  yrs ago   HEMICOLECTOMY  02/2017   fews days post op TAH for bowel obstruction   TOTAL ABDOMINAL HYSTERECTOMY  02/2017    Allergies:  No Known Allergies  Family History:  Family History  Problem Relation Age of Onset   Ovarian cancer Maternal Aunt    Colon cancer Cousin     Social History:  Social History   Tobacco Use   Smoking status: Former    Current packs/day: 0.00    Types: Cigarettes    Start date: 03/16/2016    Quit date: 03/17/2018    Years since quitting: 5.5    Passive exposure: Past   Smokeless tobacco: Never  Vaping Use   Vaping status: Never Used  Substance  Use Topics   Alcohol use: Yes    Comment: rare   Drug use: Never    Review of symptoms:  Constitutional:  Negative for unexplained weight loss, night sweats, fever, chills ENT:  Negative for nose bleeds, sinus pain, painful swallowing CV:  Negative for chest pain, shortness of breath, exercise intolerance, palpitations, loss of consciousness Resp:  Negative for cough, wheezing, shortness of breath GI:  Negative for nausea, vomiting, diarrhea, bloody stools GU:  Positives noted in HPI; otherwise negative for gross hematuria, dysuria, urinary incontinence Neuro:  Negative for seizures, poor balance, limb weakness, slurred speech Psych:  Negative for lack of energy, depression, anxiety Endocrine:  Negative for polydipsia, polyuria, symptoms of hypoglycemia (dizziness, hunger, sweating) Hematologic:  Negative for anemia, purpura, petechia, prolonged or excessive bleeding, use of anticoagulants  Allergic:  Negative for difficulty breathing or choking as a result of exposure to anything; no shellfish allergy; no allergic response (rash/itch) to materials, foods  Physical exam: LMP 08/11/2016 (Exact Date)  GENERAL APPEARANCE:  Well appearing, well developed, well nourished, NAD HEENT: Atraumatic, Normocephalic, oropharynx clear. NECK: Supple without lymphadenopathy or thyromegaly. LUNGS: Clear to auscultation bilaterally. HEART: Regular Rate and Rhythm without murmurs, gallops, or rubs. ABDOMEN: Soft, non-tender, No Masses. EXTREMITIES: Moves all extremities well.  Without clubbing, cyanosis, or edema. NEUROLOGIC:  Alert and oriented x 3, normal gait, CN II-XII grossly intact.  MENTAL STATUS:  Appropriate. BACK:  Non-tender to palpation.  No CVAT SKIN:  Warm, dry and intact.    Results: No results found for this or any previous visit (from the past 24 hours).  I have reviewed prior patient's records  I have reviewed referring/prior physicians records  I have reviewed urinalysis  I  have reviewed prior urine cultures  I reviewed prior imaging studies  Assessment: No diagnosis found.   Plan: ***

## 2023-10-21 ENCOUNTER — Encounter: Attending: Physical Medicine and Rehabilitation | Admitting: Registered Nurse

## 2023-10-21 ENCOUNTER — Ambulatory Visit: Admitting: Urology

## 2023-10-21 ENCOUNTER — Encounter: Payer: Self-pay | Admitting: Registered Nurse

## 2023-10-21 VITALS — BP 118/80 | HR 79 | Ht 60.0 in | Wt 118.0 lb

## 2023-10-21 VITALS — BP 125/80 | HR 89 | Ht 59.0 in | Wt 118.0 lb

## 2023-10-21 DIAGNOSIS — R32 Unspecified urinary incontinence: Secondary | ICD-10-CM

## 2023-10-21 DIAGNOSIS — Z79891 Long term (current) use of opiate analgesic: Secondary | ICD-10-CM | POA: Insufficient documentation

## 2023-10-21 DIAGNOSIS — G8929 Other chronic pain: Secondary | ICD-10-CM | POA: Diagnosis present

## 2023-10-21 DIAGNOSIS — G894 Chronic pain syndrome: Secondary | ICD-10-CM | POA: Insufficient documentation

## 2023-10-21 DIAGNOSIS — R350 Frequency of micturition: Secondary | ICD-10-CM

## 2023-10-21 DIAGNOSIS — M545 Low back pain, unspecified: Secondary | ICD-10-CM | POA: Diagnosis present

## 2023-10-21 DIAGNOSIS — Z5181 Encounter for therapeutic drug level monitoring: Secondary | ICD-10-CM | POA: Diagnosis present

## 2023-10-21 DIAGNOSIS — R102 Pelvic and perineal pain unspecified side: Secondary | ICD-10-CM | POA: Insufficient documentation

## 2023-10-21 DIAGNOSIS — R3915 Urgency of urination: Secondary | ICD-10-CM | POA: Diagnosis not present

## 2023-10-21 LAB — BLADDER SCAN AMB NON-IMAGING

## 2023-10-21 LAB — URINALYSIS, ROUTINE W REFLEX MICROSCOPIC
Bilirubin, UA: NEGATIVE
Glucose, UA: NEGATIVE
Ketones, UA: NEGATIVE
Leukocytes,UA: NEGATIVE
Nitrite, UA: NEGATIVE
Protein,UA: NEGATIVE
RBC, UA: NEGATIVE
Specific Gravity, UA: 1.025 (ref 1.005–1.030)
Urobilinogen, Ur: 1 mg/dL (ref 0.2–1.0)
pH, UA: 6 (ref 5.0–7.5)

## 2023-10-21 MED ORDER — OXYCODONE-ACETAMINOPHEN 10-325 MG PO TABS: 1.0000 | ORAL_TABLET | Freq: Every day | ORAL | 0 refills | Status: DC | PRN

## 2023-10-21 MED ORDER — SOLIFENACIN SUCCINATE 10 MG PO TABS: 10.0000 mg | ORAL_TABLET | Freq: Every day | ORAL | 11 refills | Status: AC

## 2023-10-21 NOTE — Progress Notes (Signed)
 Subjective:    Patient ID: Christine Brooks, female    DOB: [birth year], 43 y.o.   MRN: [id number]  HPI: Christine Brooks is a 43 y.o. female who returns for follow up appointment for chronic pain and medication refill. She states her pain is located in her lower back and pelvic pain. She rates her pain 0 at this time. Her current exercise regime is walking and performing stretching exercises.  Ms. Strycharz Morphine equivalent is 15.00 MME.   Last UDS was Performed on 06/03/2023, it was consistent.    Pain Inventory Average Pain 9 Pain Right Now 0 My pain is sharp, dull, and stabbing  In the last 24 hours, has pain interfered with the following? General activity 0 Relation with others 0 Enjoyment of life 0 What TIME of day is your pain at its worst? varies Sleep (in general) Fair  Pain is worse with: walking, bending, sitting, inactivity, and standing Pain improves with: medication Relief from Meds: 10  Family History  Problem Relation Age of Onset   Ovarian cancer Maternal Aunt    Colon cancer Cousin    Social History   Socioeconomic History   Marital status: Single    Spouse name: Not on file   Number of children: 2   Years of education: Not on file   Highest education level: Some college, no degree  Occupational History   Not on file  Tobacco Use   Smoking status: Former    Current packs/day: 0.00    Types: Cigarettes    Start date: 03/16/2016    Quit date: 03/17/2018    Years since quitting: 5.6    Passive exposure: Past   Smokeless tobacco: Never  Vaping Use   Vaping status: Never Used  Substance and Sexual Activity   Alcohol use: Yes    Comment: rare   Drug use: Never   Sexual activity: Not Currently    Partners: Male    Birth control/protection: Surgical    Comment: partial hysterectomy  Other Topics Concern   Not on file  Social History Narrative   Not on file   Social Drivers of Health   Financial Resource Strain: Not on file  Food Insecurity: Not on  file  Transportation Needs: No Transportation Needs (05/19/2020)   PRAPARE - Administrator, Civil Service (Medical): No    Lack of Transportation (Non-Medical): No  Physical Activity: Not on file  Stress: Not on file  Social Connections: Not on file   Past Surgical History:  Procedure Laterality Date   BLADDER SURGERY  09/2021   Endometriosis   CESAREAN SECTION  1999;  07/ 2009   CYSTOSCOPY N/A 03/21/2020   Procedure: CYSTOSCOPY with biopsy;  Surgeon: Marilynne Rosaline SAILOR, MD;  Location: [facility];  Service: Gynecology;  Laterality: N/A;  Total procedure time 1 hour   CYSTOSCOPY WITH URETHRAL DILATATION Right 05/2018   W/  TURBT   (right ureteral stricture)   DIAGNOSTIC LAPAROSCOPY  12/2016   DILATION AND CURETTAGE OF UTERUS  yrs ago   HEMICOLECTOMY  02/2017   fews days post op TAH for bowel obstruction   TOTAL ABDOMINAL HYSTERECTOMY  02/2017   Past Surgical History:  Procedure Laterality Date   BLADDER SURGERY  09/2021   Endometriosis   CESAREAN SECTION  1999;  07/ 2009   CYSTOSCOPY N/A 03/21/2020   Procedure: CYSTOSCOPY with biopsy;  Surgeon: Marilynne Rosaline SAILOR, MD;  Location: [facility];  Service: Gynecology;  Laterality: N/A;  Total procedure time 1 hour   CYSTOSCOPY WITH URETHRAL DILATATION Right 05/2018   W/  TURBT   (right ureteral stricture)   DIAGNOSTIC LAPAROSCOPY  12/2016   DILATION AND CURETTAGE OF UTERUS  yrs ago   HEMICOLECTOMY  02/2017   fews days post op TAH for bowel obstruction   TOTAL ABDOMINAL HYSTERECTOMY  02/2017   Past Medical History:  Diagnosis Date   Bladder mass    Dysuria    Endometriosis    Endometriosis, bladder    Hematuria    History of 2019 novel coronavirus disease (COVID-19) 08/24/2019   positive covid test result in epic, per pt mild symptoms that resolved   History of small bowel obstruction 02/2017   2019 post op TAH  w/ surgical bowel resection   Lower urinary tract symptoms (LUTS)     Nephrolithiasis    per pt nonobstructive   BP 118/80   Pulse 79   Ht 5' (1.524 m)   Wt 118 lb (53.5 kg)   LMP 08/11/2016 (Exact Date)   SpO2 98%   BMI 23.05 kg/m   Opioid Risk Score:   Fall Risk Score:  `1  Depression screen [facility] 2/9     08/05/2023   11:07 AM 06/03/2023    9:49 AM 03/12/2023    9:17 AM 02/04/2023    3:03 PM 12/03/2022    3:20 PM 10/01/2022    3:24 PM 07/23/2022    3:43 PM  Depression screen PHQ 2/9  Decreased Interest 0 1 0 0 0 0 0  Down, Depressed, Hopeless 0 0 0 0 0 0 0  PHQ - 2 Score 0 1 0 0 0 0 0  Altered sleeping  1       Tired, decreased energy  1       Change in appetite  0       Feeling bad or failure about yourself   0       Trouble concentrating  1       Moving slowly or fidgety/restless  0       Suicidal thoughts  0       PHQ-9 Score  4       Difficult doing work/chores  Not difficult at all           Review of Systems  Genitourinary:  Positive for pelvic pain.  All other systems reviewed and are negative.      Objective:   Physical Exam Vitals and nursing note reviewed.  Constitutional:      Appearance: Normal appearance.  Cardiovascular:     Rate and Rhythm: Normal rate and regular rhythm.     Pulses: Normal pulses.     Heart sounds: Normal heart sounds.  Pulmonary:     Effort: Pulmonary effort is normal.     Breath sounds: Normal breath sounds.  Musculoskeletal:     Comments: Normal Muscle Bulk and Muscle Testing Reveals:  Upper Extremities: Full ROM and Muscle Strength 5/5  Lower Extremities : Full ROM and Muscle Strength 5/5 Arises from Table with ease Narrow Based Gait     Skin:    General: Skin is warm and dry.  Neurological:     Mental Status: She is alert and oriented to person, place, and time.  Psychiatric:        Mood and Affect: Mood normal.        Behavior: Behavior normal.          Assessment &  Plan:  Chronic abdominal pain secondary to endometriosis/ Pelvic Pain: S/P PR LAP,FULGURATE/EXCISE LESIONS   PR EXC TUMOR SOFT TISSUE ABDOMINAL WALL SUBQ <3CM  PR CYSTOURETHROSCOPY  PR PART REMV BLADDER,SIMPLE  LAPAROSCOPY, SURGICAL; W/FULGURATION OR EXCISION OF LESIONS OVARY,  PELVIC VISCERA, PERITONEAL SURFACE  EXCISION, TUMOR, SOFT TISSUE OF ABDOMINAL WALL, SUBCUTANEOUS; LESS  THAN 3 CM S/P on 09/28/2021.Dr Chuck.  Continue  to Monitor. 10/21/2023 2.  Chronic abdominal pain secondary to endometriosis/ Pelvic Pain: S/PSurgery on 09/28/2021, GYN following. Continue current medication regimen. Refilled : Oxycodone  10 mg/325 mg one tablet daily as needed for pain. #30. Second script sent for the following month.We will continue the opioid monitoring program, this consists of regular clinic visits, examinations, urine drug screen, pill counts as well as use of [place]  Controlled Substance Reporting system. A 12 month History has been reviewed on the [place]  Cont[place] Substance Reporting System on 10/21/2023 3. Chronic Pain Syndrome: Continue current medication . Continue HEP as Tolerated . Continue to Monitor. 10/21/2023 4. Chronic Low Back Pain without Sciatica: Ms. K[place] states she has a scheduled appointment with  Ortho. Continue to Monitor.10/21/2023   F/U in 2 months

## 2023-11-06 ENCOUNTER — Ambulatory Visit (HOSPITAL_BASED_OUTPATIENT_CLINIC_OR_DEPARTMENT_OTHER)

## 2023-11-06 ENCOUNTER — Other Ambulatory Visit (HOSPITAL_BASED_OUTPATIENT_CLINIC_OR_DEPARTMENT_OTHER): Payer: Self-pay

## 2023-11-06 ENCOUNTER — Other Ambulatory Visit (HOSPITAL_BASED_OUTPATIENT_CLINIC_OR_DEPARTMENT_OTHER): Payer: Self-pay | Admitting: Orthopaedic Surgery

## 2023-11-06 ENCOUNTER — Ambulatory Visit (INDEPENDENT_AMBULATORY_CARE_PROVIDER_SITE_OTHER): Admitting: Orthopaedic Surgery

## 2023-11-06 ENCOUNTER — Encounter: Payer: Self-pay | Admitting: Physical Medicine and Rehabilitation

## 2023-11-06 DIAGNOSIS — M25551 Pain in right hip: Secondary | ICD-10-CM

## 2023-11-06 DIAGNOSIS — M25552 Pain in left hip: Secondary | ICD-10-CM | POA: Diagnosis not present

## 2023-11-06 MED ORDER — TRIAMCINOLONE ACETONIDE 40 MG/ML IJ SUSP
80.0000 mg | INTRAMUSCULAR | Status: AC | PRN
  Administered 2023-11-06: 80 mg via INTRA_ARTICULAR

## 2023-11-06 MED ORDER — LIDOCAINE HCL 1 % IJ SOLN
4.0000 mL | INTRAMUSCULAR | Status: AC | PRN
  Administered 2023-11-06: 4 mL

## 2023-11-06 NOTE — Progress Notes (Signed)
 Chief Complaint: Bilateral hip pain     History of Present Illness:    Christine Brooks is a 43 y.o. female presents today with ongoing chronic bilateral hip pain for the last several years.  She is getting pain that radiates down both legs and experiencing this in the groin as well.  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  She does take ibuprofen  which does help somewhat    PMH/PSH/Family History/Social History/Meds/Allergies:    Past Medical History:  Diagnosis Date   Bladder mass    Dysuria    Endometriosis    Endometriosis, bladder    Hematuria    History of 2019 novel coronavirus disease (COVID-19) 08/24/2019   positive covid test result in epic, per pt mild symptoms that resolved   History of small bowel obstruction 02/2017   2019 post op TAH  w/ surgical bowel resection   Lower urinary tract symptoms (LUTS)    Nephrolithiasis    per pt nonobstructive   Past Surgical History:  Procedure Laterality Date   BLADDER SURGERY  09/2021   Endometriosis   CESAREAN SECTION  1999;  07/ 2009   CYSTOSCOPY N/A 03/21/2020   Procedure: CYSTOSCOPY with biopsy;  Surgeon: Marilynne Rosaline SAILOR, MD;  Location: [facility];  Service: Gynecology;  Laterality: N/A;  Total procedure time 1 hour   CYSTOSCOPY WITH URETHRAL DILATATION Right 05/2018   W/  TURBT   (right ureteral stricture)   DIAGNOSTIC LAPAROSCOPY  12/2016   DILATION AND CURETTAGE OF UTERUS  yrs ago   HEMICOLECTOMY  02/2017   fews days post op TAH for bowel obstruction   TOTAL ABDOMINAL HYSTERECTOMY  02/2017   Social History   Socioeconomic History   Marital status: Single    Spouse name: Not on file   Number of children: 2   Years of education: Not on file   Highest education level: Some college, no degree  Occupational History   Not on file  Tobacco Use   Smoking status: Former    Current packs/day: 0.00    Types: Cigarettes    Start date:  03/16/2016    Quit date: 03/17/2018    Years since quitting: 5.6    Passive exposure: Past   Smokeless tobacco: Never  Vaping Use   Vaping status: Never Used  Substance and Sexual Activity   Alcohol use: Yes    Comment: rare   Drug use: Never   Sexual activity: Not Currently    Partners: Male    Birth control/protection: Surgical    Comment: partial hysterectomy  Other Topics Concern   Not on file  Social History Narrative   Not on file   Social Drivers of Health   Financial Resource Strain: Not on file  Food Insecurity: Not on file  Transportation Needs: No Transportation Needs (05/19/2020)   PRAPARE - Administrator, Civil Service (Medical): No    Lack of Transportation (Non-Medical): No  Physical Activity: Not on file  Stress: Not on file  Social Connections: Not on file   Family History  Problem Relation Age of Onset   Ovarian cancer Maternal Aunt    Colon cancer Cousin    No Known Allergies Current Outpatient Medications  Medication Sig Dispense Refill   acetaminophen  (TYLENOL ) 325 MG tablet Take by mouth. (Patient not taking: Reported on 10/21/2023)     Cholecalciferol (VITAMIN D ) 50 MCG (2000 UT)  CAPS Take 1 capsule (2,000 Units total) by mouth daily before breakfast. (Patient not taking: Reported on 10/21/2023) 30 capsule 11   Fluocinolone Acetonide Scalp 0.01 % OIL Apply topically at bedtime. (Patient not taking: Reported on 10/21/2023)     ibuprofen  (ADVIL ) 800 MG tablet Take 1 tablet (800 mg total) by mouth every 8 (eight) hours as needed. 30 tablet 5   norethindrone  (AYGESTIN ) 5 MG tablet Take 1 tablet (5 mg total) by mouth daily. (Patient not taking: Reported on 10/21/2023) 30 tablet 11   oxyCODONE -acetaminophen  (PERCOCET) 10-325 MG tablet Take 1 tablet by mouth daily as needed for pain. 30 tablet 0   solifenacin (VESICARE) 10 MG tablet Take 1 tablet (10 mg total) by mouth daily. 30 tablet 11   terconazole  (TERAZOL 3 ) 0.8 % vaginal cream Place 1 applicator  vaginally at bedtime. (Patient not taking: Reported on 10/21/2023) 20 g 0   Current Facility-Administered Medications  Medication Dose Route Frequency Provider Last Rate Last Admin   sodium bicarbonate  4.2 % injection 2.5 mEq  2.5 mEq Intravenous Once Marilynne Rosaline SAILOR, MD       No results found.  Review of Systems:   A ROS was performed including pertinent positives and negatives as documented in the HPI.  Physical Exam :   Constitutional: NAD and appears stated age Neurological: Alert and oriented Psych: Appropriate affect and cooperative Last menstrual period 08/11/2016.   Comprehensive Musculoskeletal Exam:    Bilateral positive FADIR with hip to 9 degrees flexion 30 degrees external rotation and internal rotation causes pain good abduction strength   Imaging:   Xray (4 views left hip, 4 views right hip): Normal    I personally reviewed and interpreted the radiographs.   Assessment and Plan:   43 y.o. female with evidence of bilateral hip instability.  At today's visit I did recommend initial ultrasound-guided injections of both hips as well as physical therapy for strengthening of the hips and I will see her back in 6 weeks to check her progress  -  Procedure Note  Patient: Christine Brooks             Date of Birth: [birth year]           MRN: [id number]             Visit Date: 11/06/2023  Procedures: Visit Diagnoses:  1. Bilateral hip pain     Large Joint Inj: R hip joint on 11/06/2023 5:18 PM Indications: pain Details: 22 G 3.5 in needle, ultrasound-guided anterolateral approach  Arthrogram: No  Medications: 4 mL lidocaine  1 %; 80 mg triamcinolone  acetonide 40 MG/ML Outcome: tolerated well, no immediate complications Procedure, treatment alternatives, risks and benefits explained, specific risks discussed. Consent was given by the patient. Immediately prior to procedure a time out was called to verify the correct patient, procedure, equipment, support staff  and site/side marked as required. Patient was prepped and draped in the usual sterile fashion.         I personally saw and evaluated the patient, and participated in the management and treatment plan.  Elspeth Parker, MD Attending Physician, Orthopedic Surgery  This document was dictated using Dragon voice recognition software. A reasonable attempt at proof reading has been made to minimize errors.

## 2023-11-08 ENCOUNTER — Telehealth: Payer: Self-pay | Admitting: Physical Medicine and Rehabilitation

## 2023-11-08 ENCOUNTER — Encounter: Payer: Self-pay | Admitting: Physical Medicine and Rehabilitation

## 2023-11-08 NOTE — Telephone Encounter (Signed)
 P called to follow up on the mychart msg she sent you 10/22

## 2023-11-16 ENCOUNTER — Encounter: Payer: Self-pay | Admitting: Physical Medicine and Rehabilitation

## 2023-11-18 ENCOUNTER — Encounter: Payer: Self-pay | Admitting: Radiology

## 2023-11-19 ENCOUNTER — Other Ambulatory Visit: Payer: Self-pay | Admitting: Physical Medicine and Rehabilitation

## 2023-11-19 MED ORDER — OXYCODONE-ACETAMINOPHEN 10-325 MG PO TABS: 1.0000 | ORAL_TABLET | Freq: Two times a day (BID) | ORAL | 0 refills | Status: DC | PRN

## 2023-11-25 ENCOUNTER — Encounter: Payer: Self-pay | Admitting: Physical Therapy

## 2023-11-25 ENCOUNTER — Ambulatory Visit (INDEPENDENT_AMBULATORY_CARE_PROVIDER_SITE_OTHER): Admitting: Physical Therapy

## 2023-11-25 DIAGNOSIS — R262 Difficulty in walking, not elsewhere classified: Secondary | ICD-10-CM | POA: Diagnosis not present

## 2023-11-25 DIAGNOSIS — M25552 Pain in left hip: Secondary | ICD-10-CM | POA: Diagnosis not present

## 2023-11-25 DIAGNOSIS — M25551 Pain in right hip: Secondary | ICD-10-CM | POA: Diagnosis not present

## 2023-11-25 DIAGNOSIS — M6281 Muscle weakness (generalized): Secondary | ICD-10-CM

## 2023-11-25 DIAGNOSIS — M5459 Other low back pain: Secondary | ICD-10-CM

## 2023-11-25 NOTE — Therapy (Signed)
 OUTPATIENT PHYSICAL THERAPY LOWER EXTREMITY EVALUATION   Patient Name: Christine Brooks MRN: [id number] DOB:Jun 22, 1980, 43 y.o., female Today's Date: 11/25/2023  END OF SESSION:  PT End of Session - 11/25/23 1025     Visit Number 1    Number of Visits 20    Date for Recertification  02/07/24    Authorization Type U[employer]    PT Start Time 1015    PT Stop Time 1055    PT Time Calculation (min) 40 min    Activity Tolerance Patient tolerated treatment well    Behavior During Therapy [facility] for tasks assessed/performed          Past Medical History:  Diagnosis Date   Bladder mass    Dysuria    Endometriosis    Endometriosis, bladder    Hematuria    History of 2019 novel coronavirus disease (COVID-19) 08/24/2019   positive covid test result in epic, per pt mild symptoms that resolved   History of small bowel obstruction 02/2017   2019 post op TAH  w/ surgical bowel resection   Lower urinary tract symptoms (LUTS)    Nephrolithiasis    per pt nonobstructive   Past Surgical History:  Procedure Laterality Date   BLADDER SURGERY  09/2021   Endometriosis   CESAREAN SECTION  1999;  07/ 2009   CYSTOSCOPY N/A 03/21/2020   Procedure: CYSTOSCOPY with biopsy;  Surgeon: Marilynne Rosaline SAILOR, MD;  Location: [facility];  Service: Gynecology;  Laterality: N/A;  Total procedure time 1 hour   CYSTOSCOPY WITH URETHRAL DILATATION Right 05/2018   W/  TURBT   (right ureteral stricture)   DIAGNOSTIC LAPAROSCOPY  12/2016   DILATION AND CURETTAGE OF UTERUS  yrs ago   HEMICOLECTOMY  02/2017   fews days post op TAH for bowel obstruction   TOTAL ABDOMINAL HYSTERECTOMY  02/2017   Patient Active Problem List   Diagnosis Date Noted   Pelvic pressure in female 12/22/2015   Episodic tension-type headache, not intractable 08/16/2015   Occipital neuralgia of left side 04/20/2014   Headache 02/25/2013    PCP: Patient, No Pcp Per   REFERRING PROVIDER: Genelle Standing, MD   REFERRING  DIAG:  Diagnosis  M25.551,M25.552 (ICD-10-CM) - Bilateral hip pain    THERAPY DIAG:  Bilateral hip pain  Difficulty in walking, not elsewhere classified  Muscle weakness (generalized)  Rationale for Evaluation and Treatment: Rehabilitation  ONSET DATE: over a year  SUBJECTIVE:   SUBJECTIVE STATEMENT: Pt arriving today reporting over a year long history of bilateral hip pain and low back pain.   PERTINENT HISTORY: See PMH   PAIN:  NPRS scale: 6-7/10 Pain location: bil hips L>R Pain description: achy, sharp at times Aggravating factors: standing, walking   PRECAUTIONS: None  WEIGHT BEARING RESTRICTIONS: No  FALLS:  Has patient fallen in last 6 months? No  LIVING ENVIRONMENT: Lives with: lives with their family   OCCUPATION: works at post [employer]  PLOF: Independent  PATIENT GOALS: Work without pain  Next MD visit:   OBJECTIVE:   DIAGNOSTIC FINDINGS: LUMBAR SPINE: 11/06/23 BONES: No acute fracture. No aggressive appearing osseous lesion. Alignment is anatomic with flexion and extension.   DISCS AND DEGENERATIVE CHANGES: No severe degenerative changes.   SOFT TISSUES: No acute abnormality.   IMPRESSION: 1. No acute abnormality of the lumbar spine.   PATIENT SURVEYS:  Patient-Specific Activity Scoring Scheme  0 represents "unable to perform." 10 represents "able to perform at prior level. 0 1 2 3  4 5 6 7 8 9 10  (Date and Score)   Activity Eval  11/25/23    1. Standing for long periods  5    2. squatting 2     3. Lifting things at work 10#  4   4.    5.    Score 3.7    Total score = sum of the activity scores/number of activities Minimum detectable change (90%CI) for average score = 2 points Minimum detectable change (90%CI) for single activity score = 3 points  COGNITION: Overall cognitive status: WFL    SENSATION: WFL   LOWER EXTREMITY ROM:   ROM Right Eval 11/25/23 Left Eval 11/25/23  Hip flexion A: 100 A: 102  Hip  extension    Hip abduction    Hip adduction    Hip internal rotation A: 20 A: 22  Hip external rotation A: 18 A: 20  Knee flexion A: 132 A: 128  Knee extension A: 0 A: 0  Ankle dorsiflexion    Ankle plantarflexion    Ankle inversion    Ankle eversion     (Blank rows = not tested)  LOWER EXTREMITY MMT:  MMT Right Eval 11/25/23 HHD in pounds Left Eval 11/25/23 HHD in pounds  Hip flexion 6.5, 8.5 6.9, 11.0  Hip extension    Hip abduction    Hip adduction    Hip internal rotation    Hip external rotation    Knee flexion 10.4, 9.6 8.0 , 7.8  Knee extension 15.7, 13.0 10.1, 15.7  Ankle dorsiflexion    Ankle plantarflexion    Ankle inversion    Ankle eversion     (Blank rows = not tested)    FUNCTIONAL TESTS:  11/25/23: 5 time sit to stand: 21.5 seconds c UE support  GAIT: Distance walked: clinic distances Assistive device utilized: None Level of assistance: Complete Independence Comments: slow cadence and mild antalgic gait pattern                                                                                                                                                                         TODAY'S TREATMENT                                                                          DATE:11/24/13 Therex:    HEP instruction/performance c cues for techniques, handout provided.  Trial set performed of each for comprehension and symptom assessment.  See below for  exercise list.   PATIENT EDUCATION:  Education details: HEP, POC Person educated: Patient Education method: Explanation, Demonstration, Verbal cues, and Handouts Education comprehension: verbalized understanding, returned demonstration, and verbal cues required  HOME EXERCISE PROGRAM: Access Code: [email or web address] URL: [email or web address] Date: 11/25/2023 Prepared by: Delon Lunger  Exercises - Supine Lower Trunk Rotation  - 2 x daily - 7 x weekly - 3 reps - 20 seconds hold - Supine  Bridge  - 2 x daily - 7 x weekly - 1-2 sets - 10 reps - 5 seconds hold - Supine Piriformis Stretch with Foot on Ground  - 2 x daily - 7 x weekly - 3 reps - 20 seconds hold - Standing Hip Flexor Stretch  - 2 x daily - 7 x weekly - 3 reps - 20 seconds hold  ASSESSMENT:  CLINICAL IMPRESSION: Patient is a 43 y.o. who comes to clinic with complaints of bil hip pain with mobility, strength and movement coordination deficits that impair their ability to perform usual daily and recreational functional activities without increase difficulty/symptoms at this time.  Patient to benefit from skilled PT services to address impairments and limitations to improve to previous level of function without restriction secondary to condition.   OBJECTIVE IMPAIRMENTS: decreased mobility, difficulty walking, decreased ROM, decreased strength, impaired flexibility, and pain.   ACTIVITY LIMITATIONS: lifting, bending, standing, squatting, sleeping, and stairs  PARTICIPATION LIMITATIONS: driving, shopping, community activity, and occupation  PERSONAL FACTORS: see PMH above are also affecting patient's functional outcome.   REHAB POTENTIAL: Good  CLINICAL DECISION MAKING: Stable/uncomplicated  EVALUATION COMPLEXITY: Low   GOALS: Goals reviewed with patient? Yes  SHORT TERM GOALS: (target date for Short term goals are 3 weeks 12/16/2023)   1.  Patient will demonstrate independent use of home exercise program to maintain progress from in clinic treatments.  Goal status: New  LONG TERM GOALS: (target dates for all long term goals are 10 weeks  02/03/2024 )   1. Patient will demonstrate/report pain at worst less than or equal to 2/10 to facilitate minimal limitation in daily activity secondary to pain symptoms.  Goal status: New   2. Patient will demonstrate independent use of home exercise program to facilitate ability to maintain/progress functional gains from skilled physical therapy services.  Goal status:  New   3. Patient will demonstrate Patient specific functional scale avg > or = 5.7 to indicate reduced disability due to condition.   Goal status: New   4.  Patient will demonstrate bil  LE MMT by >/= 8 pounds throughout to faciltiate usual transfers, stairs, squatting at PLOF for daily life.   Goal status: New   5.  Patient will demonstrate 5 time sit to stand to </= 14 seconds c/s UE support.  Goal status: New   6.  Pt will report being able to stand for 30 minutes with bil hip pain </= 3/10.  Goal status: New      PLAN:  PT FREQUENCY: 1-2x/week  PT DURATION: 10 weeks  PLANNED INTERVENTIONS: Can include 02853- PT Re-evaluation, 97110-Therapeutic exercises, 97530- Therapeutic activity, [id number]- Neuromuscular re-education, 97535- Self Care, 97140- Manual therapy, [phone]- Gait training, [phone]- Orthotic Fit/training, [phone]- Canalith repositioning, [id number]- Aquatic Therapy, [phone]- Electrical stimulation (unattended), [id number] Physical performance testing, 97016- Vasopneumatic device, [id number]- Ultrasound, [id number]- Traction (mechanical), [id number]- Ionotophoresis 4mg /ml Dexamethasone ,  79439 - Needle insertion w/o injection 1 or 2 muscles, 20561 - Needle insertion w/o injection 3 or more muscles.   Patient/Family education, Balance training, Stair training, Taping, Dry Needling, Joint  mobilization, Joint manipulation, Spinal manipulation, Spinal mobilization, Scar mobilization, Vestibular training, Visual/preceptual remediation/compensation, DME instructions, Cryotherapy, and Moist heat.  All performed as medically necessary.  All included unless contraindicated  PLAN FOR NEXT SESSION: Review HEP knowledge/results. LE strengthening, lumbar stretching as tolerated    Delon JONELLE Lunger, PT, MPT 11/25/2023, 1:28 PM

## 2023-11-28 ENCOUNTER — Encounter: Payer: Self-pay | Admitting: Physical Medicine and Rehabilitation

## 2023-12-02 ENCOUNTER — Encounter: Payer: Self-pay | Admitting: Physical Medicine and Rehabilitation

## 2023-12-10 ENCOUNTER — Encounter: Payer: Self-pay | Admitting: Physical Therapy

## 2023-12-10 ENCOUNTER — Ambulatory Visit (INDEPENDENT_AMBULATORY_CARE_PROVIDER_SITE_OTHER): Admitting: Physical Therapy

## 2023-12-10 DIAGNOSIS — M5459 Other low back pain: Secondary | ICD-10-CM | POA: Diagnosis not present

## 2023-12-10 DIAGNOSIS — M25552 Pain in left hip: Secondary | ICD-10-CM

## 2023-12-10 DIAGNOSIS — M6281 Muscle weakness (generalized): Secondary | ICD-10-CM

## 2023-12-10 DIAGNOSIS — R262 Difficulty in walking, not elsewhere classified: Secondary | ICD-10-CM | POA: Diagnosis not present

## 2023-12-10 DIAGNOSIS — M25551 Pain in right hip: Secondary | ICD-10-CM

## 2023-12-10 NOTE — Therapy (Signed)
 OUTPATIENT PHYSICAL THERAPY LOWER EXTREMITY EVALUATION   Patient Name: Christine Brooks MRN: [id number] DOB:02-27-1980, 43 y.o., female Today's Date: 12/10/2023  END OF SESSION:  PT End of Session - 12/10/23 1426     Visit Number 2    Number of Visits 20    Date for Recertification  02/07/24    Authorization Type UHC    PT Start Time 1345    PT Stop Time 1423    PT Time Calculation (min) 38 min    Activity Tolerance Patient tolerated treatment well    Behavior During Therapy [facility] for tasks assessed/performed           Past Medical History:  Diagnosis Date   Bladder mass    Dysuria    Endometriosis    Endometriosis, bladder    Hematuria    History of 2019 novel coronavirus disease (COVID-19) 08/24/2019   positive covid test result in epic, per pt mild symptoms that resolved   History of small bowel obstruction 02/2017   2019 post op TAH  w/ surgical bowel resection   Lower urinary tract symptoms (LUTS)    Nephrolithiasis    per pt nonobstructive   Past Surgical History:  Procedure Laterality Date   BLADDER SURGERY  09/2021   Endometriosis   CESAREAN SECTION  1999;  07/ 2009   CYSTOSCOPY N/A 03/21/2020   Procedure: CYSTOSCOPY with biopsy;  Surgeon: Marilynne Rosaline SAILOR, MD;  Location: [facility];  Service: Gynecology;  Laterality: N/A;  Total procedure time 1 hour   CYSTOSCOPY WITH URETHRAL DILATATION Right 05/2018   W/  TURBT   (right ureteral stricture)   DIAGNOSTIC LAPAROSCOPY  12/2016   DILATION AND CURETTAGE OF UTERUS  yrs ago   HEMICOLECTOMY  02/2017   fews days post op TAH for bowel obstruction   TOTAL ABDOMINAL HYSTERECTOMY  02/2017   Patient Active Problem List   Diagnosis Date Noted   Pelvic pressure in female 12/22/2015   Episodic tension-type headache, not intractable 08/16/2015   Occipital neuralgia of left side 04/20/2014   Headache 02/25/2013    PCP: Patient, No Pcp Per   REFERRING PROVIDER: Genelle Standing,  MD   REFERRING DIAG:  Diagnosis  M25.551,M25.552 (ICD-10-CM) - Bilateral hip pain    THERAPY DIAG:  Bilateral hip pain  Difficulty in walking, not elsewhere classified  Muscle weakness (generalized)  Other low back pain  Rationale for Evaluation and Treatment: Rehabilitation  ONSET DATE: over a year  SUBJECTIVE:   SUBJECTIVE STATEMENT: No pain reported upon arrival.   PERTINENT HISTORY: See PMH   PAIN:  NPRS scale: 0/10 today, worse pain 8/10 in low back, 4/10 worse pain in legs Pain location: bil hips L>R Pain description: achy, sharp at times Aggravating factors: standing, walking   PRECAUTIONS: None  WEIGHT BEARING RESTRICTIONS: No  FALLS:  Has patient fallen in last 6 months? No  LIVING ENVIRONMENT: Lives with: lives with their family   OCCUPATION: works at post office  PLOF: Independent  PATIENT GOALS: Work without pain  Next MD visit:   OBJECTIVE:   DIAGNOSTIC FINDINGS: LUMBAR SPINE: 11/06/23 BONES: No acute fracture. No aggressive appearing osseous lesion. Alignment is anatomic with flexion and extension.   DISCS AND DEGENERATIVE CHANGES: No severe degenerative changes.   SOFT TISSUES: No acute abnormality.   IMPRESSION: 1. No acute abnormality of the lumbar spine.   PATIENT SURVEYS:  Patient-Specific Activity Scoring Scheme  0 represents "unable to perform." 10 represents "able to perform at  prior level. 0 1 2 3 4 5 6 7 8 9  10 (Date and Score)   Activity Eval  11/25/23    1. Standing for long periods  5    2. squatting 2     3. Lifting things at work 10#  4   4.    5.    Score 3.7    Total score = sum of the activity scores/number of activities Minimum detectable change (90%CI) for average score = 2 points Minimum detectable change (90%CI) for single activity score = 3 points  COGNITION: Overall cognitive status: WFL    SENSATION: WFL   LOWER EXTREMITY ROM:   ROM Right Eval 11/25/23  Left Eval 11/25/23  Hip flexion A: 100 A: 102  Hip extension    Hip abduction    Hip adduction    Hip internal rotation A: 20 A: 22  Hip external rotation A: 18 A: 20  Knee flexion A: 132 A: 128  Knee extension A: 0 A: 0  Ankle dorsiflexion    Ankle plantarflexion    Ankle inversion    Ankle eversion     (Blank rows = not tested)  LOWER EXTREMITY MMT:  MMT Right Eval 11/25/23 HHD in pounds Left Eval 11/25/23 HHD in pounds  Hip flexion 6.5, 8.5 6.9, 11.0  Hip extension    Hip abduction    Hip adduction    Hip internal rotation    Hip external rotation    Knee flexion 10.4, 9.6 8.0 , 7.8  Knee extension 15.7, 13.0 10.1, 15.7  Ankle dorsiflexion    Ankle plantarflexion    Ankle inversion    Ankle eversion     (Blank rows = not tested)    FUNCTIONAL TESTS:  11/25/23: 5 time sit to stand: 21.5 seconds c UE support  GAIT: Distance walked: clinic distances Assistive device utilized: None Level of assistance: Complete Independence Comments: slow cadence and mild antalgic gait pattern                                                                                                                                                                         TODAY'S TREATMENT                                                                          DATE:12/10/23 Therex: Recumbent bike: 6 minutes Level 2 Supine trunk rotation x 3 bil holding 20 sec Prone press ups x 3  TherActivites:  Leg Press: 50# bil x 20 Bridges 2 x 10 holding 5 sec Manual  STM to lumbar paraspinals Percussion device to lumbar paraspinals and glutes/piriformis      TODAY'S TREATMENT                                                                          DATE:11/25/23 Therex:    HEP instruction/performance c cues for techniques, handout provided.  Trial set performed of each for comprehension and symptom assessment.  See below for exercise list.   PATIENT EDUCATION:  Education details: HEP,  POC Person educated: Patient Education method: Explanation, Demonstration, Verbal cues, and Handouts Education comprehension: verbalized understanding, returned demonstration, and verbal cues required  HOME EXERCISE PROGRAM: Access Code: [email or web address] URL: [email or web address] Date: 11/25/2023 Prepared by: Delon Lunger  Exercises - Supine Lower Trunk Rotation  - 2 x daily - 7 x weekly - 3 reps - 20 seconds hold - Supine Bridge  - 2 x daily - 7 x weekly - 1-2 sets - 10 reps - 5 seconds hold - Supine Piriformis Stretch with Foot on Ground  - 2 x daily - 7 x weekly - 3 reps - 20 seconds hold - Standing Hip Flexor Stretch  - 2 x daily - 7 x weekly - 3 reps - 20 seconds hold  ASSESSMENT:  CLINICAL IMPRESSION: Pt tolerating exercises well today with good response to manual therapy. We discussed purchasing a percussor for home use to help with muscle tightness. Recommending continued skilled PT interventions.   OBJECTIVE IMPAIRMENTS: decreased mobility, difficulty walking, decreased ROM, decreased strength, impaired flexibility, and pain.   ACTIVITY LIMITATIONS: lifting, bending, standing, squatting, sleeping, and stairs  PARTICIPATION LIMITATIONS: driving, shopping, community activity, and occupation  PERSONAL FACTORS: see PMH above are also affecting patient's functional outcome.   REHAB POTENTIAL: Good  CLINICAL DECISION MAKING: Stable/uncomplicated  EVALUATION COMPLEXITY: Low   GOALS: Goals reviewed with patient? Yes  SHORT TERM GOALS: (target date for Short term goals are 3 weeks 12/16/2023)   1.  Patient will demonstrate independent use of home exercise program to maintain progress from in clinic treatments.  Goal status: New  LONG TERM GOALS: (target dates for all long term goals are 10 weeks  02/03/2024 )   1. Patient will demonstrate/report pain at worst less than or equal to 2/10 to facilitate minimal limitation in daily activity secondary to pain  symptoms.  Goal status: New   2. Patient will demonstrate independent use of home exercise program to facilitate ability to maintain/progress functional gains from skilled physical therapy services.  Goal status: New   3. Patient will demonstrate Patient specific functional scale avg > or = 5.7 to indicate reduced disability due to condition.   Goal status: New   4.  Patient will demonstrate bil  LE MMT by >/= 8 pounds throughout to faciltiate usual transfers, stairs, squatting at PLOF for daily life.   Goal status: New   5.  Patient will demonstrate 5 time sit to stand to </= 14 seconds c/s UE support.  Goal status: New   6.  Pt will report being able to stand for 30 minutes with bil hip pain </= 3/10.  Goal status: New  PLAN:  PT FREQUENCY: 1-2x/week  PT DURATION: 10 weeks  PLANNED INTERVENTIONS: Can include 02853- PT Re-evaluation, 97110-Therapeutic exercises, 97530- Therapeutic activity, [id number]- Neuromuscular re-education, 97535- Self Care, 97140- Manual therapy, [phone]- Gait training, [phone]- Orthotic Fit/training, [phone]- Canalith repositioning, [id number]- Aquatic Therapy, [phone]- Electrical stimulation (unattended), [id number] Physical performance testing, 97016- Vasopneumatic device, [id number]- Ultrasound, [id number]- Traction (mechanical), [id number]- Ionotophoresis 4mg /ml Dexamethasone ,  79439 - Needle insertion w/o injection 1 or 2 muscles, 20561 - Needle insertion w/o injection 3 or more muscles.   Patient/Family education, Balance training, Stair training, Taping, Dry Needling, Joint mobilization, Joint manipulation, Spinal manipulation, Spinal mobilization, Scar mobilization, Vestibular training, Visual/preceptual remediation/compensation, DME instructions, Cryotherapy, and Moist heat.  All performed as medically necessary.  All included unless contraindicated  PLAN FOR NEXT SESSION: Manual/ percussion LE strengthening, lumbar stretching as tolerated, core strengthening    Delon JONELLE Lunger, PT, MPT 12/10/2023, 2:30 PM

## 2023-12-16 ENCOUNTER — Encounter: Attending: Physical Medicine and Rehabilitation | Admitting: Registered Nurse

## 2023-12-16 ENCOUNTER — Encounter: Payer: Self-pay | Admitting: Registered Nurse

## 2023-12-16 VITALS — BP 114/78 | HR 83 | Ht 59.0 in | Wt 120.2 lb

## 2023-12-16 DIAGNOSIS — M545 Low back pain, unspecified: Secondary | ICD-10-CM | POA: Insufficient documentation

## 2023-12-16 DIAGNOSIS — R102 Pelvic and perineal pain unspecified side: Secondary | ICD-10-CM | POA: Insufficient documentation

## 2023-12-16 DIAGNOSIS — G8929 Other chronic pain: Secondary | ICD-10-CM | POA: Insufficient documentation

## 2023-12-16 DIAGNOSIS — Z5181 Encounter for therapeutic drug level monitoring: Secondary | ICD-10-CM | POA: Diagnosis present

## 2023-12-16 DIAGNOSIS — G894 Chronic pain syndrome: Secondary | ICD-10-CM | POA: Insufficient documentation

## 2023-12-16 DIAGNOSIS — Z79891 Long term (current) use of opiate analgesic: Secondary | ICD-10-CM | POA: Diagnosis present

## 2023-12-16 MED ORDER — OXYCODONE-ACETAMINOPHEN 10-325 MG PO TABS: 1.0000 | ORAL_TABLET | Freq: Two times a day (BID) | ORAL | 0 refills | Status: DC | PRN

## 2023-12-16 MED ORDER — OXYCODONE-ACETAMINOPHEN 10-325 MG PO TABS: 1.0000 | ORAL_TABLET | Freq: Two times a day (BID) | ORAL | 0 refills | Status: AC | PRN

## 2023-12-16 NOTE — Progress Notes (Unsigned)
 Subjective:    Patient ID: Christine Brooks, female    DOB: 1980-12-20, 43 y.o.   MRN: [id number]  HPI: Christine Brooks is a 43 y.o. female who returns for follow up appointment for chronic pain and medication refill. states *** pain is located in  ***. rates pain ***. current exercise regime is walking and performing stretching exercises.  Ms. Hannan Morphine equivalent is 30.00 MME.   Oral Swab was Performed today.    Pain Inventory Average Pain 8 Pain Right Now 0 My pain is sharp, dull, stabbing, and aching  In the last 24 hours, has pain interfered with the following? General activity 7 Relation with others 7 Enjoyment of life 7 What TIME of day is your pain at its worst? varies Sleep (in general) Fair  Pain is worse with: no answer Pain improves with: rest and medication Relief from Meds: 10  Family History  Problem Relation Age of Onset   Ovarian cancer Maternal Aunt    Colon cancer Cousin    Social History   Socioeconomic History   Marital status: Single    Spouse name: Not on file   Number of children: 2   Years of education: Not on file   Highest education level: Some college, no degree  Occupational History   Not on file  Tobacco Use   Smoking status: Former    Current packs/day: 0.00    Types: Cigarettes    Start date: 03/16/2016    Quit date: 03/17/2018    Years since quitting: 5.7    Passive exposure: Past   Smokeless tobacco: Never  Vaping Use   Vaping status: Never Used  Substance and Sexual Activity   Alcohol use: Yes    Comment: rare   Drug use: Never   Sexual activity: Not Currently    Partners: Male    Birth control/protection: Surgical    Comment: partial hysterectomy  Other Topics Concern   Not on file  Social History Narrative   Not on file   Social Drivers of Health   Financial Resource Strain: Not on file  Food Insecurity: Not on file  Transportation Needs: No Transportation Needs (05/19/2020)   PRAPARE - Scientist, Research (physical Sciences) (Medical): No    Lack of Transportation (Non-Medical): No  Physical Activity: Not on file  Stress: Not on file  Social Connections: Not on file   Past Surgical History:  Procedure Laterality Date   BLADDER SURGERY  09/2021   Endometriosis   CESAREAN SECTION  1999;  07/ 2009   CYSTOSCOPY N/A 03/21/2020   Procedure: CYSTOSCOPY with biopsy;  Surgeon: Marilynne Rosaline SAILOR, MD;  Location: [facility];  Service: Gynecology;  Laterality: N/A;  Total procedure time 1 hour   CYSTOSCOPY WITH URETHRAL DILATATION Right 05/2018   W/  TURBT   (right ureteral stricture)   DIAGNOSTIC LAPAROSCOPY  12/2016   DILATION AND CURETTAGE OF UTERUS  yrs ago   HEMICOLECTOMY  02/2017   fews days post op TAH for bowel obstruction   TOTAL ABDOMINAL HYSTERECTOMY  02/2017   Past Surgical History:  Procedure Laterality Date   BLADDER SURGERY  09/2021   Endometriosis   CESAREAN SECTION  1999;  07/ 2009   CYSTOSCOPY N/A 03/21/2020   Procedure: CYSTOSCOPY with biopsy;  Surgeon: Marilynne Rosaline SAILOR, MD;  Location: [facility];  Service: Gynecology;  Laterality: N/A;  Total procedure time 1 hour   CYSTOSCOPY WITH URETHRAL DILATATION Right  05/2018   W/  TURBT   (right ureteral stricture)   DIAGNOSTIC LAPAROSCOPY  12/2016   DILATION AND CURETTAGE OF UTERUS  yrs ago   HEMICOLECTOMY  02/2017   fews days post op TAH for bowel obstruction   TOTAL ABDOMINAL HYSTERECTOMY  02/2017   Past Medical History:  Diagnosis Date   Bladder mass    Dysuria    Endometriosis    Endometriosis, bladder    Hematuria    History of 2019 novel coronavirus disease (COVID-19) 08/24/2019   positive covid test result in epic, per pt mild symptoms that resolved   History of small bowel obstruction 02/2017   2019 post op TAH  w/ surgical bowel resection   Lower urinary tract symptoms (LUTS)    Nephrolithiasis    per pt nonobstructive   BP 114/78 (BP Location: Left Arm, Patient Position:  Sitting)   Pulse 83   Ht 4' 11 (1.499 m)   Wt 120 lb 3.2 oz (54.5 kg)   LMP 08/11/2016 (Exact Date)   SpO2 98%   BMI 24.28 kg/m   Opioid Risk Score:   Fall Risk Score:  `1  Depression screen [facility] 2/9     08/05/2023   11:07 AM 06/03/2023    9:49 AM 03/12/2023    9:17 AM 02/04/2023    3:03 PM 12/03/2022    3:20 PM 10/01/2022    3:24 PM 07/23/2022    3:43 PM  Depression screen PHQ 2/9  Decreased Interest 0 1 0 0 0 0 0  Down, Depressed, Hopeless 0 0 0 0 0 0 0  PHQ - 2 Score 0 1 0 0 0 0 0  Altered sleeping  1       Tired, decreased energy  1       Change in appetite  0       Feeling bad or failure about yourself   0       Trouble concentrating  1       Moving slowly or fidgety/restless  0       Suicidal thoughts  0       PHQ-9 Score  4        Difficult doing work/chores  Not difficult at all          Data saved with a previous flowsheet row definition      Review of Systems  Musculoskeletal:  Positive for back pain and myalgias.       Lower back pain radiating into buttocks down right leg  All other systems reviewed and are negative.      Objective:   Physical Exam        Assessment & Plan:  Chronic abdominal pain secondary to endometriosis/ Pelvic Pain: S/P PR LAP,FULGURATE/EXCISE LESIONS  PR EXC TUMOR SOFT TISSUE ABDOMINAL WALL SUBQ <3CM  PR CYSTOURETHROSCOPY  PR PART REMV BLADDER,SIMPLE  LAPAROSCOPY, SURGICAL; W/FULGURATION OR EXCISION OF LESIONS OVARY,  PELVIC VISCERA, PERITONEAL SURFACE  EXCISION, TUMOR, SOFT TISSUE OF ABDOMINAL WALL, SUBCUTANEOUS; LESS  THAN 3 CM S/P on 09/28/2021.Dr Chuck.  Continue  to Monitor. 10/21/2023 2.  Chronic abdominal pain secondary to endometriosis/ Pelvic Pain: S/PSurgery on 09/28/2021, GYN following. Continue current medication regimen. Refilled : Oxycodone  10 mg/325 mg one tablet daily as needed for pain. #30. Second script sent for the following month.We will continue the opioid monitoring program, this consists of regular  clinic visits, examinations, urine drug screen, pill counts as well as use of [place]  Controlled Substance Reporting  system. A 12 month History has been reviewed on the [place]  Controlled Substance Reporting System on 10/21/2023 3. Chronic Pain Syndrome: Continue current medication . Continue HEP as Tolerated . Continue to Monitor. 10/21/2023 4. Chronic Low Back Pain without Sciatica: Ms. Colton states she has a scheduled appointment with  Ortho. Continue to Monitor.10/21/2023   F/U in 2 months

## 2023-12-17 ENCOUNTER — Ambulatory Visit: Admitting: Physical Therapy

## 2023-12-17 ENCOUNTER — Encounter: Payer: Self-pay | Admitting: Physical Therapy

## 2023-12-17 DIAGNOSIS — M6281 Muscle weakness (generalized): Secondary | ICD-10-CM | POA: Diagnosis not present

## 2023-12-17 DIAGNOSIS — M25552 Pain in left hip: Secondary | ICD-10-CM

## 2023-12-17 DIAGNOSIS — M25551 Pain in right hip: Secondary | ICD-10-CM

## 2023-12-17 DIAGNOSIS — M5459 Other low back pain: Secondary | ICD-10-CM

## 2023-12-17 DIAGNOSIS — R262 Difficulty in walking, not elsewhere classified: Secondary | ICD-10-CM

## 2023-12-17 NOTE — Therapy (Addendum)
 " OUTPATIENT PHYSICAL THERAPY LOWER EXTREMITY  DISCHARGE   Patient Name: Christine Brooks MRN: [id number] DOB:Mar 05, 1980, 43 y.o., female Today's Date: 12/17/2023  END OF SESSION:  PT End of Session - 12/17/23 1537     Visit Number 3    Number of Visits 20    Date for Recertification  02/07/24    Authorization Type [employer]    PT Start Time 1515    PT Stop Time 1555    PT Time Calculation (min) 40 min    Activity Tolerance Patient tolerated treatment well    Behavior During Therapy [facility] for tasks assessed/performed            Past Medical History:  Diagnosis Date   Bladder mass    Dysuria    Endometriosis    Endometriosis, bladder    Hematuria    History of 2019 novel coronavirus disease (COVID-19) 08/24/2019   positive covid test result in epic, per pt mild symptoms that resolved   History of small bowel obstruction 02/2017   2019 post op TAH  w/ surgical bowel resection   Lower urinary tract symptoms (LUTS)    Nephrolithiasis    per pt nonobstructive   Past Surgical History:  Procedure Laterality Date   BLADDER SURGERY  09/2021   Endometriosis   CESAREAN SECTION  1999;  07/ 2009   CYSTOSCOPY N/A 03/21/2020   Procedure: CYSTOSCOPY with biopsy;  Surgeon: Marilynne Rosaline SAILOR, MD;  Location: [facility];  Service: Gynecology;  Laterality: N/A;  Total procedure time 1 hour   CYSTOSCOPY WITH URETHRAL DILATATION Right 05/2018   W/  TURBT   (right ureteral stricture)   DIAGNOSTIC LAPAROSCOPY  12/2016   DILATION AND CURETTAGE OF UTERUS  yrs ago   HEMICOLECTOMY  02/2017   fews days post op TAH for bowel obstruction   TOTAL ABDOMINAL HYSTERECTOMY  02/2017   Patient Active Problem List   Diagnosis Date Noted   Pelvic pressure in female 12/22/2015   Episodic tension-type headache, not intractable 08/16/2015   Occipital neuralgia of left side 04/20/2014   Headache 02/25/2013    PCP: Patient, No Pcp Per   REFERRING PROVIDER: Genelle Standing,  MD   REFERRING DIAG:  Diagnosis  M25.551,M25.552 (ICD-10-CM) - Bilateral hip pain    THERAPY DIAG:  Bilateral hip pain  Difficulty in walking, not elsewhere classified  Muscle weakness (generalized)  Other low back pain  Rationale for Evaluation and Treatment: Rehabilitation  ONSET DATE: over a year  SUBJECTIVE:   SUBJECTIVE STATEMENT: Pt reporting new onset of pain running from her low back down her left leg into her knee.   PERTINENT HISTORY: See PMH   PAIN:  NPRS scale: no pain today, 8/10 at worse with pain running down left LE into knee Pain location: bil hips L>R Pain description: achy, sharp at times Aggravating factors: standing, walking   PRECAUTIONS: None  WEIGHT BEARING RESTRICTIONS: No  FALLS:  Has patient fallen in last 6 months? No  LIVING ENVIRONMENT: Lives with: lives with their family   OCCUPATION: works at post office  PLOF: Independent  PATIENT GOALS: Work without pain  Next MD visit:   OBJECTIVE:   DIAGNOSTIC FINDINGS: LUMBAR SPINE: 11/06/23 BONES: No acute fracture. No aggressive appearing osseous lesion. Alignment is anatomic with flexion and extension.   DISCS AND DEGENERATIVE CHANGES: No severe degenerative changes.   SOFT TISSUES: No acute abnormality.   IMPRESSION: 1. No acute abnormality of the lumbar spine.   PATIENT SURVEYS:  Patient-Specific Activity Scoring Scheme  0 represents unable to perform. 10 represents able to perform at prior level. 0 1 2 3 4 5 6 7 8 9  10 (Date and Score)   Activity Eval  11/25/23    1. Standing for long periods  5    2. squatting 2     3. Lifting things at work 10#  4   4.    5.    Score 3.7    Total score = sum of the activity scores/number of activities Minimum detectable change (90%CI) for average score = 2 points Minimum detectable change (90%CI) for single activity score = 3 points  COGNITION: Overall cognitive status: WFL    SENSATION: WFL   LOWER  EXTREMITY ROM:   ROM Right Eval 11/25/23 Left Eval 11/25/23 Rt / left 12/17/23 Supine active   Hip flexion A: 100 A: 102 105 / 105  Hip extension     Hip abduction     Hip adduction     Hip internal rotation A: 20 A: 22   Hip external rotation A: 18 A: 20   Knee flexion A: 132 A: 128   Knee extension A: 0 A: 0   Ankle dorsiflexion     Ankle plantarflexion     Ankle inversion     Ankle eversion      (Blank rows = not tested)  LOWER EXTREMITY MMT:  MMT Right Eval 11/25/23 HHD in pounds Left Eval 11/25/23 HHD in pounds    Hip flexion 6.5, 8.5 6.9, 11.0   Hip extension     Hip abduction     Hip adduction     Hip internal rotation     Hip external rotation     Knee flexion 10.4, 9.6 8.0 , 7.8   Knee extension 15.7, 13.0 10.1, 15.7   Ankle dorsiflexion     Ankle plantarflexion     Ankle inversion     Ankle eversion      (Blank rows = not tested)    FUNCTIONAL TESTS:  11/25/23: 5 time sit to stand: 21.5 seconds c UE support  GAIT: Distance walked: clinic distances Assistive device utilized: None Level of assistance: Complete Independence Comments: slow cadence and mild antalgic gait pattern                                                                                                                                                                        TODAY'S TREATMENT  DATE:12/17/23 Therex: Nustep: 6 minutes Level 5 Lumbar extension elbows on the wall x 10 holding 5 seconds Single Knee to chest x  each LE holding 30 sec Double knee to chest x 3 holding 30 sec NMR:  Cat/cow: x 10  Quadraped: bird dog x 10 holding 5 sec  Abdominal hundreds modified x 10  TherActivites:  Double Leg Press: 60# bil 2 x 10  Single leg press: 31# x 10 bil LE Rows: green TB 2 x 10  Manual  Grade 2-3 lumbar PA mobs L1-L4 Percussion device to lumbar paraspinals and glutes/piriformis     TODAY'S  TREATMENT                                                                          DATE:12/10/23 Therex: Recumbent bike: 6 minutes Level 2 Supine trunk rotation x 3 bil holding 20 sec Prone press ups x 3  TherActivites:  Leg Press: 50# bil x 20 Bridges 2 x 10 holding 5 sec Manual  STM to lumbar paraspinals Percussion device to lumbar paraspinals and glutes/piriformis      TODAY'S TREATMENT                                                                          DATE:11/25/23 Therex:    HEP instruction/performance c cues for techniques, handout provided.  Trial set performed of each for comprehension and symptom assessment.  See below for exercise list.   PATIENT EDUCATION:  Education details: HEP, POC Person educated: Patient Education method: Explanation, Demonstration, Verbal cues, and Handouts Education comprehension: verbalized understanding, returned demonstration, and verbal cues required  HOME EXERCISE PROGRAM: Access Code: [email or web address] URL: [email or web address] Date: 12/17/2023 Prepared by: Delon Lunger  Exercises - Supine Lower Trunk Rotation  - 2 x daily - 7 x weekly - 3 reps - 20 seconds hold - Supine Bridge  - 2 x daily - 7 x weekly - 1-2 sets - 10 reps - 5 seconds hold - Supine Piriformis Stretch with Foot on Ground  - 2 x daily - 7 x weekly - 3 reps - 20 seconds hold - Standing Hip Flexor Stretch  - 2 x daily - 7 x weekly - 3 reps - 20 seconds hold - Supine Double Knee to Chest  - 2 x daily - 7 x weekly - 3 reps - 30 seconds hold - Standing Lumbar Extension at Wall - Forearms  - 1 x daily - 7 x weekly - 10 reps  ASSESSMENT:  CLINICAL IMPRESSION: Pt reporting more of sciatic pain down her left LE into her knee today. Pt can't recall anything that may have set her pain off. Pt reporting she is seeing her MD in the morning. Pt tolerating exercises well, updated pt's HEP. Pt with good response to manual therapy. Continued skilled PT recommended.    OBJECTIVE IMPAIRMENTS: decreased mobility, difficulty walking, decreased ROM, decreased strength, impaired flexibility, and pain.   ACTIVITY LIMITATIONS: lifting, bending, standing,  squatting, sleeping, and stairs  PARTICIPATION LIMITATIONS: driving, shopping, community activity, and occupation  PERSONAL FACTORS: see PMH above are also affecting patient's functional outcome.   REHAB POTENTIAL: Good  CLINICAL DECISION MAKING: Stable/uncomplicated  EVALUATION COMPLEXITY: Low   GOALS: Goals reviewed with patient? Yes  SHORT TERM GOALS: (target date for Short term goals are 3 weeks 12/16/2023)   1.  Patient will demonstrate independent use of home exercise program to maintain progress from in clinic treatments.  Goal status: New  LONG TERM GOALS: (target dates for all long term goals are 10 weeks  02/03/2024 )   1. Patient will demonstrate/report pain at worst less than or equal to 2/10 to facilitate minimal limitation in daily activity secondary to pain symptoms.  Goal status: ongoing 12/17/23   2. Patient will demonstrate independent use of home exercise program to facilitate ability to maintain/progress functional gains from skilled physical therapy services.  Goal status: 12/17/23   3. Patient will demonstrate Patient specific functional scale avg > or = 5.7 to indicate reduced disability due to condition.   Goal status: New   4.  Patient will demonstrate bil  LE MMT by >/= 8 pounds throughout to faciltiate usual transfers, stairs, squatting at PLOF for daily life.   Goal status: New   5.  Patient will demonstrate 5 time sit to stand to </= 14 seconds c/s UE support.  Goal status: New   6.  Pt will report being able to stand for 30 minutes with bil hip pain </= 3/10.  Goal status: New      PLAN:  PT FREQUENCY: 1-2x/week  PT DURATION: 10 weeks  PLANNED INTERVENTIONS: Can include 02853- PT Re-evaluation, 97110-Therapeutic exercises, 97530- Therapeutic activity,  [id number]- Neuromuscular re-education, 97535- Self Care, 97140- Manual therapy, [phone]- Gait training, [phone]- Orthotic Fit/training, [phone]- Canalith repositioning, [id number]- Aquatic Therapy, [phone]- Electrical stimulation (unattended), [id number] Physical performance testing, 97016- Vasopneumatic device, [id number]- Ultrasound, [id number]- Traction (mechanical), [id number]- Ionotophoresis 4mg /ml Dexamethasone ,  [id number] - Needle insertion w/o injection 1 or 2 muscles, 20561 - Needle insertion w/o injection 3 or more muscles.   Patient/Family education, Balance training, Stair training, Taping, Dry Needling, Joint mobilization, Joint manipulation, Spinal manipulation, Spinal mobilization, Scar mobilization, Vestibular training, Visual/preceptual remediation/compensation, DME instructions, Cryotherapy, and Moist heat.  All performed as medically necessary.  All included unless contraindicated  PLAN FOR NEXT SESSION: Manual/ percussion LE strengthening, lumbar stretching as tolerated, core strengthening    Delon JONELLE Lunger, PT, MPT 12/17/2023, 4:13 PM  PHYSICAL THERAPY DISCHARGE SUMMARY  Visits from Start of Care: 3  Current functional level related to goals / functional outcomes: See above   Remaining deficits: See above   Education / Equipment: HEP   Patient agrees to discharge. Patient goals were not met. Patient is being discharged due to not returning since the last visit.  "

## 2023-12-18 ENCOUNTER — Ambulatory Visit (INDEPENDENT_AMBULATORY_CARE_PROVIDER_SITE_OTHER): Admitting: Orthopaedic Surgery

## 2023-12-18 DIAGNOSIS — M25551 Pain in right hip: Secondary | ICD-10-CM

## 2023-12-18 DIAGNOSIS — M25552 Pain in left hip: Secondary | ICD-10-CM

## 2023-12-18 DIAGNOSIS — M5416 Radiculopathy, lumbar region: Secondary | ICD-10-CM | POA: Diagnosis not present

## 2023-12-18 NOTE — Progress Notes (Signed)
 Chief Complaint: Bilateral hip pain     History of Present Illness:   12/18/2023: Presents today for follow-up of her hips.  She has been participating in physical therapy without relief.  She is getting shooting pain down the left leg as well  Christine Brooks is a 43 y.o. female presents today with ongoing chronic bilateral hip pain for the last several years.  She is getting pain that radiates down both legs and experiencing this in the groin as well.  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  She does take ibuprofen  which does help somewhat    PMH/PSH/Family History/Social History/Meds/Allergies:    Past Medical History:  Diagnosis Date   Bladder mass    Dysuria    Endometriosis    Endometriosis, bladder    Hematuria    History of 2019 novel coronavirus disease (COVID-19) 08/24/2019   positive covid test result in epic, per pt mild symptoms that resolved   History of small bowel obstruction 02/2017   2019 post op TAH  w/ surgical bowel resection   Lower urinary tract symptoms (LUTS)    Nephrolithiasis    per pt nonobstructive   Past Surgical History:  Procedure Laterality Date   BLADDER SURGERY  09/2021   Endometriosis   CESAREAN SECTION  1999;  07/ 2009   CYSTOSCOPY N/A 03/21/2020   Procedure: CYSTOSCOPY with biopsy;  Surgeon: Marilynne Rosaline SAILOR, MD;  Location: [facility];  Service: Gynecology;  Laterality: N/A;  Total procedure time 1 hour   CYSTOSCOPY WITH URETHRAL DILATATION Right 05/2018   W/  TURBT   (right ureteral stricture)   DIAGNOSTIC LAPAROSCOPY  12/2016   DILATION AND CURETTAGE OF UTERUS  yrs ago   HEMICOLECTOMY  02/2017   fews days post op TAH for bowel obstruction   TOTAL ABDOMINAL HYSTERECTOMY  02/2017   Social History   Socioeconomic History   Marital status: Single    Spouse name: Not on file   Number of children: 2   Years of education: Not on file   Highest education  level: Some college, no degree  Occupational History   Not on file  Tobacco Use   Smoking status: Former    Current packs/day: 0.00    Types: Cigarettes    Start date: 03/16/2016    Quit date: 03/17/2018    Years since quitting: 5.7    Passive exposure: Past   Smokeless tobacco: Never  Vaping Use   Vaping status: Never Used  Substance and Sexual Activity   Alcohol use: Yes    Comment: rare   Drug use: Never   Sexual activity: Not Currently    Partners: Male    Birth control/protection: Surgical    Comment: partial hysterectomy  Other Topics Concern   Not on file  Social History Narrative   Not on file   Social Drivers of Health   Financial Resource Strain: Not on file  Food Insecurity: Not on file  Transportation Needs: No Transportation Needs (05/19/2020)   PRAPARE - Administrator, Civil Service (Medical): No    Lack of Transportation (Non-Medical): No  Physical Activity: Not on file  Stress: Not on file  Social Connections: Not on file   Family History  Problem Relation Age of Onset   Ovarian cancer Maternal Aunt    Colon cancer Cousin    No Known Allergies Current Outpatient Medications  Medication  Sig Dispense Refill   Cholecalciferol (VITAMIN D ) 50 MCG (2000 UT) CAPS Take 1 capsule (2,000 Units total) by mouth daily before breakfast. 30 capsule 11   Fluocinolone Acetonide Scalp 0.01 % OIL Apply topically at bedtime.     ibuprofen  (ADVIL ) 800 MG tablet Take 1 tablet (800 mg total) by mouth every 8 (eight) hours as needed. 30 tablet 5   norethindrone  (AYGESTIN ) 5 MG tablet Take 1 tablet (5 mg total) by mouth daily. 30 tablet 11   oxyCODONE -acetaminophen  (PERCOCET) 10-325 MG tablet Take 1 tablet by mouth 2 (two) times daily as needed for pain. 60 tablet 0   solifenacin  (VESICARE ) 10 MG tablet Take 1 tablet (10 mg total) by mouth daily. 30 tablet 11   terconazole  (TERAZOL 3 ) 0.8 % vaginal cream Place 1 applicator vaginally at bedtime. 20 g 0   Current  Facility-Administered Medications  Medication Dose Route Frequency Provider Last Rate Last Admin   sodium bicarbonate  4.2 % injection 2.5 mEq  2.5 mEq Intravenous Once Marilynne Rosaline SAILOR, MD       No results found.  Review of Systems:   A ROS was performed including pertinent positives and negatives as documented in the HPI.  Physical Exam :   Constitutional: NAD and appears stated age Neurological: Alert and oriented Psych: Appropriate affect and cooperative Last menstrual period 08/11/2016.   Comprehensive Musculoskeletal Exam:    Bilateral positive FADIR with hip to 9 degrees flexion 30 degrees external rotation and internal rotation causes pain good abduction strength Positive straight leg raise on the left  Imaging:   Xray (4 views left hip, 4 views right hip): Normal    I personally reviewed and interpreted the radiographs.   Assessment and Plan:   43 y.o. female with evidence of bilateral hip instability.  Also have some evidence of radicular symptoms and given this I did discuss the possibility of an MRI of her lumbar spine as well as bilateral hips as she does appear to have ongoing hip instability which she did get relief from her injection.  Will plan for these MRIs and I will see her back discuss results  - For MRI bilateral hips, lumbar spine and follow-up discussed results   I personally saw and evaluated the patient, and participated in the management and treatment plan.  Elspeth Parker, MD Attending Physician, Orthopedic Surgery  This document was dictated using Dragon voice recognition software. A reasonable attempt at proof reading has been made to minimize errors.

## 2023-12-19 LAB — DRUG TOX MONITOR 1 W/CONF, ORAL FLD
Amphetamines: NEGATIVE ng/mL (ref <10)
Barbiturates: NEGATIVE ng/mL (ref <10)
Benzodiazepines: NEGATIVE ng/mL (ref <0.50)
Buprenorphine: NEGATIVE ng/mL (ref <0.10)
Cocaine: NEGATIVE ng/mL (ref <5.0)
Cotinine: 101.5 ng/mL — ABNORMAL HIGH (ref <5.0)
Fentanyl: NEGATIVE ng/mL (ref <0.10)
Heroin Metabolite: NEGATIVE ng/mL (ref <1.0)
MARIJUANA: NEGATIVE ng/mL (ref <2.5)
MDMA: NEGATIVE ng/mL (ref <10)
Meprobamate: NEGATIVE ng/mL (ref <2.5)
Methadone: NEGATIVE ng/mL (ref <5.0)
Nicotine Metabolite: POSITIVE ng/mL — AB (ref <5.0)
Opiates: NEGATIVE ng/mL (ref <2.5)
Phencyclidine: NEGATIVE ng/mL (ref <10)
Tapentadol: NEGATIVE ng/mL (ref <5.0)
Tramadol: NEGATIVE ng/mL (ref <5.0)
Zolpidem: NEGATIVE ng/mL (ref <5.0)

## 2023-12-19 LAB — DRUG TOX ALC METAB W/CON, ORAL FLD: Alcohol Metabolite: NEGATIVE ng/mL (ref <25)

## 2023-12-22 NOTE — Progress Notes (Deleted)
 Assessment: Urinary frequency, urgency-overactive bladder.  Exacerbated No Doubt by her history of treated endometriosis  Plan: -I do not think she needs any further diagnostic testing at this point  - I sent in a prescription for solifenacin   - I will have her come back in a couple of months to recheck.  If that does not work well, consider putting her back or Myrbetriq  which she feels helped her symptoms somewhat     History of Present Illness:  10.6.2025: Initial visit forevaluation of lower urinary tract symptomatology.  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  Even before that, she had urinary frequency and urgency as well as nocturia.  This continues despite having that excision.  Symptoms are worse 1 to 2 weeks out of the month.  She has had no gross hematuria.  She has been on Myrbetriq  and Gemtesa in the past.  She feels like the Myrbetriq  did help her symptoms significantly.  She did not have improvement with oxybutynin .  She is not currently on any medical therapy for OAB.   Past Medical History:  Past Medical History:  Diagnosis Date   Bladder mass    Dysuria    Endometriosis    Endometriosis, bladder    Hematuria    History of 2019 novel coronavirus disease (COVID-19) 08/24/2019   positive covid test result in epic, per pt mild symptoms that resolved   History of small bowel obstruction 02/2017   2019 post op TAH  w/ surgical bowel resection   Lower urinary tract symptoms (LUTS)    Nephrolithiasis    per pt nonobstructive    Past Surgical History:  Past Surgical History:  Procedure Laterality Date   BLADDER SURGERY  09/2021   Endometriosis   CESAREAN SECTION  1999;  07/ 2009   CYSTOSCOPY N/A 03/21/2020   Procedure: CYSTOSCOPY with biopsy;  Surgeon: Marilynne Rosaline SAILOR, MD;  Location: [facility];  Service: Gynecology;  Laterality: N/A;  Total procedure time 1 hour    CYSTOSCOPY WITH URETHRAL DILATATION Right 05/2018   W/  TURBT   (right ureteral stricture)   DIAGNOSTIC LAPAROSCOPY  12/2016   DILATION AND CURETTAGE OF UTERUS  yrs ago   HEMICOLECTOMY  02/2017   fews days post op TAH for bowel obstruction   TOTAL ABDOMINAL HYSTERECTOMY  02/2017    Allergies:  No Known Allergies  Family History:  Family History  Problem Relation Age of Onset   Ovarian cancer Maternal Aunt    Colon cancer Cousin     Social History:  Social History   Tobacco Use   Smoking status: Former    Current packs/day: 0.00    Types: Cigarettes    Start date: 03/16/2016    Quit date: 03/17/2018    Years since quitting: 5.7    Passive exposure: Past   Smokeless tobacco: Never  Vaping Use   Vaping status: Never Used  Substance Use Topics   Alcohol use: Yes    Comment: rare   Drug use: Never    Review of symptoms:  Constitutional:  Negative for unexplained weight loss, night sweats, fever, chills ENT:  Negative for nose bleeds, sinus pain, painful swallowing CV:  Negative for chest pain, shortness of breath, exercise intolerance, palpitations, loss of consciousness Resp:  Negative for cough, wheezing, shortness of breath GI:  Negative for nausea, vomiting, diarrhea, bloody stools GU:  Positives noted in HPI; otherwise negative for gross hematuria,  dysuria, urinary incontinence Neuro:  Negative for seizures, poor balance, limb weakness, slurred speech Psych:  Negative for lack of energy, depression, anxiety Endocrine:  Negative for polydipsia, polyuria, symptoms of hypoglycemia (dizziness, hunger, sweating) Hematologic:  Negative for anemia, purpura, petechia, prolonged or excessive bleeding, use of anticoagulants  Allergic:  Negative for difficulty breathing or choking as a result of exposure to anything; no shellfish allergy; no allergic response (rash/itch) to materials, foods  Physical exam: LMP 08/11/2016 (Exact Date)  GENERAL APPEARANCE:  Well appearing, well  developed, well nourished, NAD HEENT: Atraumatic, Normocephalic, oropharynx clear. NECK: Supple without lymphadenopathy or thyromegaly. LUNGS: Clear to auscultation bilaterally. HEART: Regular Rate and Rhythm without murmurs, gallops, or rubs. EXTREMITIES: Moves all extremities well.  Without clubbing, cyanosis, or edema. NEUROLOGIC:  Alert and oriented x 3, normal gait, CN II-XII grossly intact.  MENTAL STATUS:  Appropriate. BACK:  Non-tender to palpation.  No CVAT SKIN:  Warm, dry and intact.    Results:   I have reviewed referring/prior physicians records  I have reviewed urinalysis  Bladder scan volume 0

## 2023-12-23 ENCOUNTER — Ambulatory Visit: Admitting: Urology

## 2023-12-23 DIAGNOSIS — R32 Unspecified urinary incontinence: Secondary | ICD-10-CM

## 2023-12-23 DIAGNOSIS — R3915 Urgency of urination: Secondary | ICD-10-CM

## 2023-12-24 ENCOUNTER — Encounter: Admitting: Physical Therapy

## 2023-12-31 ENCOUNTER — Encounter: Admitting: Physical Therapy

## 2024-01-07 ENCOUNTER — Ambulatory Visit
Admission: RE | Admit: 2024-01-07 | Discharge: 2024-01-07 | Disposition: A | Discharge status: Home / Self Care | Source: Ambulatory Visit | Attending: Orthopaedic Surgery | Admitting: Orthopaedic Surgery

## 2024-01-07 DIAGNOSIS — M25551 Pain in right hip: Secondary | ICD-10-CM

## 2024-01-07 DIAGNOSIS — M5416 Radiculopathy, lumbar region: Secondary | ICD-10-CM

## 2024-01-21 DIAGNOSIS — N6002 Solitary cyst of left breast: Secondary | ICD-10-CM

## 2024-01-27 ENCOUNTER — Encounter: Payer: Self-pay | Admitting: Physical Medicine and Rehabilitation

## 2024-01-29 ENCOUNTER — Ambulatory Visit (HOSPITAL_BASED_OUTPATIENT_CLINIC_OR_DEPARTMENT_OTHER): Admitting: Orthopaedic Surgery

## 2024-02-03 ENCOUNTER — Other Ambulatory Visit: Payer: Self-pay | Admitting: Obstetrics

## 2024-02-03 ENCOUNTER — Other Ambulatory Visit (HOSPITAL_COMMUNITY)
Admission: RE | Admit: 2024-02-03 | Discharge: 2024-02-03 | Disposition: A | Discharge status: Home / Self Care | Source: Ambulatory Visit | Attending: Obstetrics | Admitting: Obstetrics

## 2024-02-03 ENCOUNTER — Ambulatory Visit: Payer: Self-pay | Admitting: Obstetrics

## 2024-02-03 VITALS — BP 120/84 | HR 101 | Ht 59.0 in | Wt 120.2 lb

## 2024-02-03 DIAGNOSIS — B369 Superficial mycosis, unspecified: Secondary | ICD-10-CM | POA: Diagnosis not present

## 2024-02-03 DIAGNOSIS — N898 Other specified noninflammatory disorders of vagina: Secondary | ICD-10-CM | POA: Diagnosis not present

## 2024-02-03 DIAGNOSIS — Z0000 Encounter for general adult medical examination without abnormal findings: Secondary | ICD-10-CM

## 2024-02-03 DIAGNOSIS — Z01419 Encounter for gynecological examination (general) (routine) without abnormal findings: Secondary | ICD-10-CM | POA: Diagnosis not present

## 2024-02-03 DIAGNOSIS — D508 Other iron deficiency anemias: Secondary | ICD-10-CM | POA: Diagnosis not present

## 2024-02-03 MED ORDER — PNV-DHA+DOCUSATE 27-1.25-300 MG PO CAPS: 1.0000 | ORAL_CAPSULE | Freq: Every day | ORAL | 4 refills | Status: DC

## 2024-02-03 MED ORDER — CLOTRIMAZOLE-BETAMETHASONE 1-0.05 % EX CREA: 1.0000 | TOPICAL_CREAM | Freq: Two times a day (BID) | CUTANEOUS | 0 refills | Status: AC

## 2024-02-03 NOTE — Progress Notes (Signed)
 Patient is here for an annual check up and would like to have an STI screening including bloodwork as well.  She also requested to have some additional labs done such as her vit D and Iron levels.

## 2024-02-03 NOTE — Progress Notes (Signed)
 "  Subjective:        Christine Brooks is a 44 y.o. female here for a routine exam.  Current complaints: Vaginal discharge with itching on the outside.    Personal health questionnaire:  Is patient [name], have a family history of breast and/or ovarian cancer: yes Is there a family history of uterine cancer diagnosed at age < 85, gastrointestinal cancer, urinary tract cancer, family member who is a Personnel Officer syndrome-associated carrier: yes Is the patient overweight and hypertensive, family history of diabetes, personal history of gestational diabetes, preeclampsia or PCOS: no Is patient over 77, have PCOS,  family history of premature CHD under age 52, diabetes, smoke, have hypertension or peripheral artery disease:  no At any time, has a partner hit, kicked or otherwise hurt or frightened you?: no Over the past 2 weeks, have you felt down, depressed or hopeless?: no Over the past 2 weeks, have you felt little interest or pleasure in doing things?:no   Gynecologic History Patient's last menstrual period was 08/11/2016. Contraception: status post hysterectomy Last Pap: 2017. Results were: normal Last mammogram: 2025. Results were: normal  Obstetric History OB History  Gravida Para Term Preterm AB Living  3 2 2  1 2   SAB IAB Ectopic Multiple Live Births   1   2    # Outcome Date GA Lbr Len/2nd Weight Sex Type Anes PTL Lv  3 IAB 06/16/14          2 Term 07/29/07    M CS-LTranv   LIV  1 Term 12/07/97 [redacted]w[redacted]d   M CS-LTranv   LIV    Past Medical History:  Diagnosis Date   Bladder mass    Dysuria    Endometriosis    Endometriosis, bladder    Hematuria    History of 2019 novel coronavirus disease (COVID-19) 08/24/2019   positive covid test result in epic, per pt mild symptoms that resolved   History of small bowel obstruction 02/2017   2019 post op TAH  w/ surgical bowel resection   Lower urinary tract symptoms (LUTS)    Nephrolithiasis    per pt nonobstructive    Past  Surgical History:  Procedure Laterality Date   BLADDER SURGERY  09/2021   Endometriosis   CESAREAN SECTION  1999;  07/ 2009   CYSTOSCOPY N/A 03/21/2020   Procedure: CYSTOSCOPY with biopsy;  Surgeon: Marilynne Rosaline SAILOR, MD;  Location: [facility];  Service: Gynecology;  Laterality: N/A;  Total procedure time 1 hour   CYSTOSCOPY WITH URETHRAL DILATATION Right 05/2018   W/  TURBT   (right ureteral stricture)   DIAGNOSTIC LAPAROSCOPY  12/2016   DILATION AND CURETTAGE OF UTERUS  yrs ago   HEMICOLECTOMY  02/2017   fews days post op TAH for bowel obstruction   TOTAL ABDOMINAL HYSTERECTOMY  02/2017    Current Medications[1] Allergies[2]  Social History   Tobacco Use   Smoking status: Former    Current packs/day: 0.00    Types: Cigarettes    Start date: 03/16/2016    Quit date: 03/17/2018    Years since quitting: 5.8    Passive exposure: Past   Smokeless tobacco: Never  Substance Use Topics   Alcohol use: Yes    Comment: rare    Family History  Problem Relation Age of Onset   Ovarian cancer Maternal Aunt    Colon cancer Cousin       Review of Systems  Constitutional: negative for fatigue and weight  loss Respiratory: negative for cough and wheezing Cardiovascular: negative for chest pain, fatigue and palpitations Gastrointestinal: negative for abdominal pain and change in bowel habits Musculoskeletal:negative for myalgias Neurological: negative for gait problems and tremors Behavioral/Psych: negative for abusive relationship, depression Endocrine: negative for temperature intolerance    Genitourinary: POSITIVE FOR VAGINAL DISCHARGE WITH ITCHING ON THE OUTSIDE.  negative for abnormal menstrual periods, genital lesions, hot flashes, sexual problems  Integument/breast: negative for breast lump, breast tenderness, nipple discharge and skin lesion(s)    Objective:       BP 120/84   Pulse (!) 101   Ht 4' 11 (1.499 m)   Wt 120 lb 3.2 oz (54.5 kg)   LMP  08/11/2016   BMI 24.28 kg/m  General:   alert  Skin:   no rash or abnormalities  Lungs:   clear to auscultation bilaterally  Heart:   regular rate and rhythm, S1, S2 normal, no murmur, click, rub or gallop  Breasts:   normal without suspicious masses, skin or nipple changes or axillary nodes  Abdomen:  normal findings: no organomegaly, soft, non-tender and no hernia  Pelvis:  External genitalia: normal general appearance Urinary system: urethral meatus normal and bladder without fullness, nontender Vaginal: normal without tenderness, induration or masses Cervix: normal appearance Adnexa: normal bimanual exam Uterus: anteverted and non-tender, normal size   Lab Review Urine pregnancy test Labs reviewed yes Radiologic studies reviewed yes  I have spent a total of 20 minutes of face-to-face time, excluding clinical staff time, reviewing notes and preparing to see patient, ordering tests and/or medications, and counseling the patient.   Assessment:    1. Encounter for annual routine gynecological examination (Primary)  2. Vaginal discharge Rx: - Cervicovaginal ancillary only( [place]) - HIV antibody (with reflex) - RPR - Hepatitis C Antibody - Hepatitis B Surface AntiGEN  3. Iron deficiency anemia secondary to inadequate dietary iron intake Rx: - Prenat-FeFum-DSS-FA-DHA w/o A (PNV-DHA +DOCUSATE) 27-1.25-300 MG CAPS; Take 1 capsule by mouth daily before breakfast.  Dispense: 90 capsule; Refill: 4  4. Routine adult health maintenance Rx: - CBC - Ferritin - Comprehensive metabolic panel with GFR - Hemoglobin A1c - TSH - VITAMIN D  25 Hydroxy (Vit-D Deficiency, Fractures) - Ambulatory referral to Internal Medicine  5. Superficial fungus infection of skin Rx: - clotrimazole -betamethasone  (LOTRISONE ) cream; Apply 1 Application topically 2 (two) times daily. Do not apply more than 14 days per course of treatment.  Dispense: 60 g; Refill: 0     Plan:    Education  reviewed: calcium supplements, depression evaluation, low fat, low cholesterol diet, safe sex/STD prevention, self breast exams, and weight bearing exercise. Follow up in: 1 year.   Meds ordered this encounter  Medications   clotrimazole -betamethasone  (LOTRISONE ) cream    Sig: Apply 1 Application topically 2 (two) times daily. Do not apply more than 14 days per course of treatment.    Dispense:  60 g    Refill:  0   Prenat-FeFum-DSS-FA-DHA w/o A (PNV-DHA +DOCUSATE) 27-1.25-300 MG CAPS    Sig: Take 1 capsule by mouth daily before breakfast.    Dispense:  90 capsule    Refill:  4   Orders Placed This Encounter  Procedures   HIV antibody (with reflex)   RPR   Hepatitis C Antibody   Hepatitis B Surface AntiGEN   CBC   Ferritin   Comprehensive metabolic panel with GFR   Hemoglobin A1c   TSH   VITAMIN D  25 Hydroxy (Vit-D Deficiency, Fractures)  Ambulatory referral to Internal Medicine    Referral Priority:   Routine    Referral Type:   Consultation    Referral Reason:   Specialty Services Required    Requested Specialty:   Internal Medicine    Number of Visits Requested:   1     CARLIN RONAL CENTERS, MD, FACOG Attending Obstetrician & Gynecologist, [facility] for [facility], [facility], [facility] 02/03/2024    [1]  Current Outpatient Medications:    clotrimazole -betamethasone  (LOTRISONE ) cream, Apply 1 Application topically 2 (two) times daily. Do not apply more than 14 days per course of treatment., Disp: 60 g, Rfl: 0   ibuprofen  (ADVIL ) 800 MG tablet, Take 1 tablet (800 mg total) by mouth every 8 (eight) hours as needed., Disp: 30 tablet, Rfl: 5   oxyCODONE -acetaminophen  (PERCOCET) 10-325 MG tablet, Take 1 tablet by mouth 2 (two) times daily as needed for pain., Disp: 60 tablet, Rfl: 0   Prenat-FeFum-DSS-FA-DHA w/o A (PNV-DHA +DOCUSATE) 27-1.25-300 MG CAPS, Take 1 capsule by mouth daily before breakfast., Disp: 90 capsule, Rfl: 4    Cholecalciferol (VITAMIN D ) 50 MCG (2000 UT) CAPS, Take 1 capsule (2,000 Units total) by mouth daily before breakfast. (Patient not taking: Reported on 02/03/2024), Disp: 30 capsule, Rfl: 11   Fluocinolone Acetonide Scalp 0.01 % OIL, Apply topically at bedtime. (Patient not taking: Reported on 02/03/2024), Disp: , Rfl:    norethindrone  (AYGESTIN ) 5 MG tablet, Take 1 tablet (5 mg total) by mouth daily. (Patient not taking: Reported on 02/03/2024), Disp: 30 tablet, Rfl: 11   solifenacin  (VESICARE ) 10 MG tablet, Take 1 tablet (10 mg total) by mouth daily. (Patient not taking: Reported on 02/03/2024), Disp: 30 tablet, Rfl: 11   terconazole  (TERAZOL 3 ) 0.8 % vaginal cream, Place 1 applicator vaginally at bedtime. (Patient not taking: Reported on 02/03/2024), Disp: 20 g, Rfl: 0  Current Facility-Administered Medications:    sodium bicarbonate  4.2 % injection 2.5 mEq, 2.5 mEq, Intravenous, Once, Marilynne Rosaline SAILOR, MD [2] No Known Allergies  "

## 2024-02-04 ENCOUNTER — Encounter: Payer: Self-pay | Admitting: Obstetrics

## 2024-02-04 LAB — COMPREHENSIVE METABOLIC PANEL WITH GFR
ALT: 13 IU/L (ref 0–32)
AST: 12 IU/L (ref 0–40)
Albumin: 4.2 g/dL (ref 3.9–4.9)
Alkaline Phosphatase: 64 IU/L (ref 41–116)
BUN/Creatinine Ratio: 17 (ref 9–23)
BUN: 15 mg/dL (ref 6–24)
Bilirubin Total: 0.5 mg/dL (ref 0.0–1.2)
CO2: 22 mmol/L (ref 20–29)
Calcium: 9.2 mg/dL (ref 8.7–10.2)
Chloride: 103 mmol/L (ref 96–106)
Creatinine, Ser: 0.88 mg/dL (ref 0.57–1.00)
Globulin, Total: 2.8 g/dL (ref 1.5–4.5)
Glucose: 83 mg/dL (ref 70–99)
Potassium: 4.2 mmol/L (ref 3.5–5.2)
Sodium: 139 mmol/L (ref 134–144)
Total Protein: 7 g/dL (ref 6.0–8.5)
eGFR: 84 mL/min/1.73

## 2024-02-04 LAB — CBC
Hematocrit: 39.5 % (ref 34.0–46.6)
Hemoglobin: 12.5 g/dL (ref 11.1–15.9)
MCH: 27.9 pg (ref 26.6–33.0)
MCHC: 31.6 g/dL (ref 31.5–35.7)
MCV: 88 fL (ref 79–97)
Platelets: 204 x10E3/uL (ref 150–450)
RBC: 4.48 x10E6/uL (ref 3.77–5.28)
RDW: 12.6 % (ref 11.7–15.4)
WBC: 6 x10E3/uL (ref 3.4–10.8)

## 2024-02-04 LAB — CERVICOVAGINAL ANCILLARY ONLY
Bacterial Vaginitis (gardnerella): NEGATIVE
Candida Glabrata: NEGATIVE
Candida Vaginitis: NEGATIVE
Chlamydia: NEGATIVE
Comment: NEGATIVE
Comment: NEGATIVE
Comment: NEGATIVE
Comment: NEGATIVE
Comment: NEGATIVE
Comment: NORMAL
Neisseria Gonorrhea: NEGATIVE
Trichomonas: NEGATIVE

## 2024-02-04 LAB — FERRITIN: Ferritin: 182 ng/mL — ABNORMAL HIGH (ref 15–150)

## 2024-02-04 LAB — HEPATITIS C ANTIBODY: Hep C Virus Ab: NONREACTIVE

## 2024-02-04 LAB — VITAMIN D 25 HYDROXY (VIT D DEFICIENCY, FRACTURES): Vit D, 25-Hydroxy: 37.4 ng/mL (ref 30.0–100.0)

## 2024-02-04 LAB — HEMOGLOBIN A1C
Est. average glucose Bld gHb Est-mCnc: 103 mg/dL
Hgb A1c MFr Bld: 5.2 % (ref 4.8–5.6)

## 2024-02-04 LAB — TSH: TSH: 2.27 u[IU]/mL (ref 0.450–4.500)

## 2024-02-04 LAB — SYPHILIS: RPR W/REFLEX TO RPR TITER AND TREPONEMAL ANTIBODIES, TRADITIONAL SCREENING AND DIAGNOSIS ALGORITHM: RPR Ser Ql: NONREACTIVE

## 2024-02-04 LAB — HEPATITIS B SURFACE ANTIGEN: Hepatitis B Surface Ag: NEGATIVE

## 2024-02-04 LAB — HIV ANTIBODY (ROUTINE TESTING W REFLEX): HIV Screen 4th Generation wRfx: NONREACTIVE

## 2024-02-05 ENCOUNTER — Telehealth: Payer: Self-pay | Admitting: Physical Medicine and Rehabilitation

## 2024-02-05 NOTE — Telephone Encounter (Signed)
 Pt called and said they sent you a mychart message [phone], and they haven't gotten a response from you.

## 2024-02-06 ENCOUNTER — Encounter: Payer: Self-pay | Admitting: Physical Medicine and Rehabilitation

## 2024-02-08 ENCOUNTER — Other Ambulatory Visit: Payer: Self-pay | Admitting: Obstetrics

## 2024-02-08 DIAGNOSIS — D508 Other iron deficiency anemias: Secondary | ICD-10-CM

## 2024-02-13 ENCOUNTER — Ambulatory Visit
Admission: RE | Admit: 2024-02-13 | Discharge: 2024-02-13 | Disposition: A | Discharge status: Home / Self Care | Source: Ambulatory Visit | Attending: Obstetrics | Admitting: Obstetrics

## 2024-02-13 DIAGNOSIS — N6002 Solitary cyst of left breast: Secondary | ICD-10-CM

## 2024-02-17 ENCOUNTER — Encounter: Admitting: Registered Nurse

## 2024-02-26 ENCOUNTER — Ambulatory Visit (HOSPITAL_BASED_OUTPATIENT_CLINIC_OR_DEPARTMENT_OTHER): Admitting: Orthopaedic Surgery
